# Patient Record
Sex: Male | Born: 1935
Health system: Southern US, Community
[De-identification: ages and names within clinical notes are randomized; demographics above are authoritative.]

## PROBLEM LIST (undated history)

## (undated) DIAGNOSIS — Z77090 Contact with and (suspected) exposure to asbestos: Secondary | ICD-10-CM

## (undated) DIAGNOSIS — E785 Hyperlipidemia, unspecified: Secondary | ICD-10-CM

## (undated) DIAGNOSIS — J449 Chronic obstructive pulmonary disease, unspecified: Secondary | ICD-10-CM

## (undated) DIAGNOSIS — J189 Pneumonia, unspecified organism: Secondary | ICD-10-CM

## (undated) DIAGNOSIS — I10 Essential (primary) hypertension: Secondary | ICD-10-CM

## (undated) HISTORY — DX: Chronic obstructive pulmonary disease, unspecified: J44.9

## (undated) HISTORY — DX: Hyperlipidemia, unspecified: E78.5

## (undated) HISTORY — PX: APPENDECTOMY: SHX54

## (undated) HISTORY — DX: Essential (primary) hypertension: I10

## (undated) HISTORY — PX: SHOULDER SURGERY: SHX246

---

## 2015-12-06 ENCOUNTER — Emergency Department (HOSPITAL_BASED_OUTPATIENT_CLINIC_OR_DEPARTMENT_OTHER)
Admission: EM | Admit: 2015-12-06 | Discharge: 2015-12-06 | Disposition: A | Discharge status: Home / Self Care | Payer: Medicare Other | Attending: Emergency Medicine | Admitting: Emergency Medicine

## 2015-12-06 ENCOUNTER — Emergency Department (HOSPITAL_BASED_OUTPATIENT_CLINIC_OR_DEPARTMENT_OTHER): Payer: Medicare Other

## 2015-12-06 ENCOUNTER — Encounter (HOSPITAL_BASED_OUTPATIENT_CLINIC_OR_DEPARTMENT_OTHER): Payer: Self-pay | Admitting: *Deleted

## 2015-12-06 DIAGNOSIS — J40 Bronchitis, not specified as acute or chronic: Secondary | ICD-10-CM

## 2015-12-06 DIAGNOSIS — Z87891 Personal history of nicotine dependence: Secondary | ICD-10-CM | POA: Insufficient documentation

## 2015-12-06 DIAGNOSIS — J8410 Pulmonary fibrosis, unspecified: Secondary | ICD-10-CM

## 2015-12-06 DIAGNOSIS — J441 Chronic obstructive pulmonary disease with (acute) exacerbation: Secondary | ICD-10-CM | POA: Diagnosis not present

## 2015-12-06 DIAGNOSIS — Z79899 Other long term (current) drug therapy: Secondary | ICD-10-CM | POA: Insufficient documentation

## 2015-12-06 DIAGNOSIS — K920 Hematemesis: Secondary | ICD-10-CM | POA: Diagnosis present

## 2015-12-06 DIAGNOSIS — Z8709 Personal history of other diseases of the respiratory system: Secondary | ICD-10-CM

## 2015-12-06 DIAGNOSIS — L03115 Cellulitis of right lower limb: Secondary | ICD-10-CM | POA: Insufficient documentation

## 2015-12-06 HISTORY — DX: Contact with and (suspected) exposure to asbestos: Z77.090

## 2015-12-06 HISTORY — DX: Pneumonia, unspecified organism: J18.9

## 2015-12-06 LAB — CBC WITH DIFFERENTIAL/PLATELET
BASOS ABS: 0 10*3/uL (ref 0.0–0.1)
BASOS PCT: 0 %
Eosinophils Absolute: 0.1 10*3/uL (ref 0.0–0.7)
Eosinophils Relative: 1 %
HEMATOCRIT: 41.1 % (ref 39.0–52.0)
Hemoglobin: 14 g/dL (ref 13.0–17.0)
LYMPHS ABS: 1.1 10*3/uL (ref 0.7–4.0)
LYMPHS PCT: 8 %
MCH: 31.9 pg (ref 26.0–34.0)
MCHC: 34.1 g/dL (ref 30.0–36.0)
MCV: 93.6 fL (ref 78.0–100.0)
MONO ABS: 0.8 10*3/uL (ref 0.1–1.0)
Monocytes Relative: 6 %
NEUTROS ABS: 11.1 10*3/uL — AB (ref 1.7–7.7)
Neutrophils Relative %: 85 %
PLATELETS: 229 10*3/uL (ref 150–400)
RBC: 4.39 MIL/uL (ref 4.22–5.81)
RDW: 12.6 % (ref 11.5–15.5)
WBC: 13.2 10*3/uL — ABNORMAL HIGH (ref 4.0–10.5)

## 2015-12-06 LAB — TROPONIN I

## 2015-12-06 LAB — COMPREHENSIVE METABOLIC PANEL
ALT: 15 U/L — AB (ref 17–63)
ANION GAP: 7 (ref 5–15)
AST: 23 U/L (ref 15–41)
Albumin: 3.3 g/dL — ABNORMAL LOW (ref 3.5–5.0)
Alkaline Phosphatase: 72 U/L (ref 38–126)
BILIRUBIN TOTAL: 0.8 mg/dL (ref 0.3–1.2)
BUN: 20 mg/dL (ref 6–20)
CHLORIDE: 96 mmol/L — AB (ref 101–111)
CO2: 30 mmol/L (ref 22–32)
Calcium: 8.6 mg/dL — ABNORMAL LOW (ref 8.9–10.3)
Creatinine, Ser: 1.17 mg/dL (ref 0.61–1.24)
GFR calc Af Amer: >60 mL/min (ref >60)
GFR calc non Af Amer: 57 mL/min — ABNORMAL LOW (ref >60)
GLUCOSE: 106 mg/dL — AB (ref 65–99)
POTASSIUM: 4.4 mmol/L (ref 3.5–5.1)
Sodium: 133 mmol/L — ABNORMAL LOW (ref 135–145)
TOTAL PROTEIN: 7.4 g/dL (ref 6.5–8.1)

## 2015-12-06 LAB — I-STAT CG4 LACTIC ACID, ED: LACTIC ACID, VENOUS: 0.9 mmol/L (ref 0.5–1.9)

## 2015-12-06 MED ORDER — ALBUTEROL SULFATE (2.5 MG/3ML) 0.083% IN NEBU
5.0000 mg | INHALATION_SOLUTION | Freq: Once | RESPIRATORY_TRACT | Status: AC
  Administered 2015-12-06: 5 mg via RESPIRATORY_TRACT
  Filled 2015-12-06: qty 6

## 2015-12-06 MED ORDER — HYDROCODONE-ACETAMINOPHEN 5-325 MG PO TABS: 1.0000 | ORAL_TABLET | ORAL | 0 refills | Status: DC | PRN

## 2015-12-06 MED ORDER — KETOROLAC TROMETHAMINE 30 MG/ML IJ SOLN
30.0000 mg | Freq: Once | INTRAMUSCULAR | Status: AC
  Administered 2015-12-06: 30 mg via INTRAVENOUS
  Filled 2015-12-06: qty 1

## 2015-12-06 MED ORDER — IOPAMIDOL (ISOVUE-370) INJECTION 76%
100.0000 mL | Freq: Once | INTRAVENOUS | Status: AC | PRN
  Administered 2015-12-06: 100 mL via INTRAVENOUS

## 2015-12-06 MED ORDER — AZITHROMYCIN 250 MG PO TABS: 250.0000 mg | ORAL_TABLET | Freq: Every day | ORAL | 0 refills | Status: DC

## 2015-12-06 MED ORDER — AZITHROMYCIN 250 MG PO TABS
500.0000 mg | ORAL_TABLET | Freq: Once | ORAL | Status: AC
  Administered 2015-12-06: 500 mg via ORAL
  Filled 2015-12-06: qty 2

## 2015-12-06 MED ORDER — ALBUTEROL SULFATE HFA 108 (90 BASE) MCG/ACT IN AERS
1.0000 | INHALATION_SPRAY | RESPIRATORY_TRACT | Status: DC | PRN
  Administered 2015-12-06: 2 via RESPIRATORY_TRACT
  Filled 2015-12-06: qty 6.7

## 2015-12-06 MED ORDER — SODIUM CHLORIDE 0.9 % IV BOLUS (SEPSIS)
1000.0000 mL | Freq: Once | INTRAVENOUS | Status: AC
  Administered 2015-12-06: 1000 mL via INTRAVENOUS

## 2015-12-06 MED ORDER — METHYLPREDNISOLONE SODIUM SUCC 125 MG IJ SOLR
INTRAMUSCULAR | Status: AC
  Filled 2015-12-06: qty 2

## 2015-12-06 MED ORDER — PROBIOTIC & ACIDOPHILUS EX ST PO CAPS: 1.0000 | ORAL_CAPSULE | Freq: Two times a day (BID) | ORAL | 0 refills | Status: DC

## 2015-12-06 MED ORDER — METHYLPREDNISOLONE SODIUM SUCC 125 MG IJ SOLR
125.0000 mg | Freq: Once | INTRAMUSCULAR | Status: AC
  Administered 2015-12-06: 125 mg via INTRAVENOUS
  Filled 2015-12-06: qty 2

## 2015-12-06 MED ORDER — AEROCHAMBER PLUS FLO-VU MEDIUM MISC
1.0000 | Freq: Once | Status: AC
  Administered 2015-12-06: 1
  Filled 2015-12-06: qty 1

## 2015-12-06 MED ORDER — PREDNISONE 10 MG (21) PO TBPK: 10.0000 mg | ORAL_TABLET | Freq: Every day | ORAL | 0 refills | Status: DC

## 2015-12-06 MED ORDER — SULFAMETHOXAZOLE-TRIMETHOPRIM 800-160 MG PO TABS: 1.0000 | ORAL_TABLET | Freq: Two times a day (BID) | ORAL | 0 refills | Status: AC

## 2015-12-06 NOTE — ED Notes (Signed)
Pt on cardiac monitor and automatic VS 

## 2015-12-06 NOTE — ED Provider Notes (Signed)
[facility] Provider Note   CSN: [id number] Arrival date & time: 12/06/15  1113  First Provider Contact:  None       History   Chief Complaint Chief Complaint  Patient presents with  . Cough  . Ankle Pain    HPI Jonathan Harmon is a 82 y.o. male.  Pt said that he has had a bloody cough for the past few days.  He started feeling ill on Wednesday the 9th.  On Thursday the 10th, he noted that his right ankle was swollen.  The pt said that happens when he gets pneumonia.  The pt said that he went to the hospital in [place] on the 11th. He was told that he has pneumonia.  The pt was put on levaquin.  Pt said that he is not getting any better and is getting worse.    This is the ED visit from 8/11 from [facility]:  EMERGENCY DEPARTMENT PROVIDER NOTE  Chief Complaint  Patient presents with  . Shortness of Breath  . 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 No hemoptysis hematemesis no vomiting just malaise fatigue lack of energy. No other faint feeling dizziness lightheadedness. No other decreased urinary output rash. Gradual onset worsening over the past day contacted his Dr. is out of town so came here instead to try to headed off before it gets too bad  History: History reviewed. No pertinent past medical history. Past Surgical History:  Procedure Laterality Date  . ORTHOPEDIC SURGERY   History reviewed. No pertinent family history.  Social History  Substance Use Topics  . Smoking status: Never Smoker  . Smokeless tobacco: Never Used  . Alcohol use No   Review of Systems  Constitutional: Positive for activity change, fatigue and fever.  HENT: Negative.  Respiratory: Positive for cough and shortness of breath. Negative for wheezing and stridor.  Cardiovascular: Negative.    Gastrointestinal: Positive for nausea. Negative for abdominal pain, constipation and vomiting.  Endocrine: Negative.  Genitourinary: Negative.  Musculoskeletal: Positive for arthralgias. Negative for joint swelling.  Skin: Negative.  Neurological: Negative.  Hematological: Negative.   ED Triage Vitals  Temp Heart Rate Resp BP SpO2  12/04/15 1644 12/04/15 1644 12/04/15 1644 12/04/15 1644 12/04/15 1644  37.3 C (99.2 F) 93 18 123/73 93 %   Temp Source Heart Rate Source Patient Position BP Location FiO2 (%)  12/04/15 1644 -- -- -- --  Oral   Physical Exam  Constitutional: He is oriented to person, place, and time. He appears well-developed and well-nourished.  HENT:  Head: Atraumatic.  Right Ear: External ear normal.  Left Ear: External ear normal.  Nose: Nose normal.  Mouth/Throat: Oropharynx is clear and moist.  Eyes: Conjunctivae and EOM are normal. Pupils are equal, round, and reactive to light.  Neck: Normal range of motion. Neck supple.  Cardiovascular: Normal rate, regular rhythm, normal heart sounds and intact distal pulses.  Pulmonary/Chest: Effort normal. He has wheezes. He has rales.  Rales and rhonchi in the right lower lobe but no other respiratory distress noted  Abdominal: Soft. Bowel sounds are normal.  Musculoskeletal: Normal range of motion.  Neurological: He is alert and oriented to person, place, and time.  Skin: Skin is warm and dry.  Psychiatric: He has a normal mood and affect. His behavior is normal. Thought content normal.  Nursing note and vitals reviewed.  ED Course & MDM  ED Course   MDM  Clinical Impression(s): 1. Bronchitis   Disposition: Discharge  Dimple Casey, MD 12/04/2015 5:59 PM   Rolanda Jay, MD 12/04/15 1759      Past Medical History:  Diagnosis Date  . Asbestos exposure    diminished lung compacity from asbestos exposure 30 years  . Pneumonia    x 4    There are no active problems to display for this  patient.   Past Surgical History:  Procedure Laterality Date  . APPENDECTOMY    . SHOULDER SURGERY Bilateral    x 5 ( 2 on right ; 3 on left)       Home Medications    Prior to Admission medications   Medication Sig Start Date End Date Taking? Authorizing Provider  tiotropium (SPIRIVA) 18 MCG inhalation capsule Place 18 mcg into inhaler and inhale daily.   Yes Historical Provider, MD  azithromycin (ZITHROMAX Z-PAK) 250 MG tablet Take 1 tablet (250 mg total) by mouth daily. 12/06/15   Jacalyn Lefevre, MD  predniSONE (STERAPRED UNI-PAK 21 TAB) 10 MG (21) TBPK tablet Take 1 tablet (10 mg total) by mouth daily. Take 6 tabs by mouth daily  for 2 days, then 5 tabs for 2 days, then 4 tabs for 2 days, then 3 tabs for 2 days, 2 tabs for 2 days, then 1 tab by mouth daily for 2 days 12/06/15   Jacalyn Lefevre, MD  Probiotic Product (PROBIOTIC & ACIDOPHILUS EX ST) CAPS Take 1 capsule by mouth 2 (two) times daily. 12/06/15   Jacalyn Lefevre, MD  sulfamethoxazole-trimethoprim (BACTRIM DS,SEPTRA DS) 800-160 MG tablet Take 1 tablet by mouth 2 (two) times daily. 12/06/15 12/13/15  Jacalyn Lefevre, MD    Family History No family history on file.  Social History Social History  Substance Use Topics  . Smoking status: Former Games developer  . Smokeless tobacco: Never Used  . Alcohol use No     Comment: rarely     Allergies   Penicillins   Review of Systems Review of Systems  Constitutional: Positive for activity change, appetite change and fatigue.  Respiratory: Positive for cough, shortness of breath and wheezing.   Musculoskeletal:       Right ankle pain  All other systems reviewed and are negative.    Physical Exam Updated Vital Signs BP 123/59   Pulse 80   Temp 98 F (36.7 C) (Oral)   Resp 16   Ht 5\' 8"  (1.727 m)   Wt 130 lb (59 kg)   SpO2 92%   BMI 19.77 kg/m   Physical Exam  Constitutional: He is oriented to person, place, and time. He appears well-developed and well-nourished.    HENT:  Head: Normocephalic and atraumatic.  Right Ear: External ear normal.  Left Ear: External ear normal.  Nose: Nose normal.  Mouth/Throat: Oropharynx is clear and moist.  Eyes: Conjunctivae and EOM are normal. Pupils are equal, round, and reactive to light.  Neck: Normal range of motion. Neck supple.  Cardiovascular: Normal rate, regular rhythm, normal heart sounds and intact distal pulses.   Pulmonary/Chest: Effort normal. He has rhonchi.  Abdominal: Soft. Bowel sounds are normal.  Musculoskeletal:       Right ankle: He exhibits swelling.  Redness to right ankle  Neurological: He is alert and oriented to person, place, and time.  Skin: Skin is warm.  Psychiatric: He has a normal mood and affect. His behavior is  normal. Judgment and thought content normal.  Nursing note and vitals reviewed.    ED Treatments / Results  Labs (all labs ordered are listed, but only abnormal results are displayed) Labs Reviewed  COMPREHENSIVE METABOLIC PANEL - Abnormal; Notable for the following:       Result Value   Sodium 133 (*)    Chloride 96 (*)    Glucose, Bld 106 (*)    Calcium 8.6 (*)    Albumin 3.3 (*)    ALT 15 (*)    GFR calc non Af Amer 57 (*)    All other components within normal limits  CBC WITH DIFFERENTIAL/PLATELET - Abnormal; Notable for the following:    WBC 13.2 (*)    Neutro Abs 11.1 (*)    All other components within normal limits  CULTURE, BLOOD (ROUTINE X 2)  CULTURE, BLOOD (ROUTINE X 2)  TROPONIN I  I-STAT CG4 LACTIC ACID, ED  I-STAT CG4 LACTIC ACID, ED    EKG  EKG Interpretation  Date/Time:  Sunday December 06 2015 11:59:46 EDT Ventricular Rate:  83 PR Interval:    QRS Duration: 91 QT Interval:  345 QTC Calculation: 406 R Axis:   85 Text Interpretation:  Sinus rhythm Atrial premature complex Probable left atrial enlargement Borderline right axis deviation Confirmed by Tevin Shillingford MD, Yamili Lichtenwalner ([id number]) on 12/06/2015 12:15:17 PM       Radiology Dg Ankle  Complete Right  Result Date: 12/06/2015 CLINICAL DATA:  RIGHT lateral ankle pain and swelling with redness, unable to bear weight, patient states he has pneumonia and that his ankle gets red and swollen whenever he has pneumonia EXAM: RIGHT ANKLE - COMPLETE 3+ VIEW COMPARISON:  None FINDINGS: Mild soft tissue swelling. Osseous demineralization. Non fused ossicle at tip of lateral malleolus. Spurring at tip of medial malleolus. Joint spaces preserved. No acute fracture, dislocation or bone destruction. Plantar calcaneal spur formation. Atherosclerotic calcification at dorsalis pedis artery. IMPRESSION: No definite acute osseous abnormalities. Electronically Signed   By: Ulyses SouthwardMark  Boles M.D.   On: 12/06/2015 13:26   Ct Angio Chest W/cm &/or Wo Cm  Result Date: 12/06/2015 CLINICAL DATA:  Productive cough with hemoptysis. Short of breath. Symptoms for 1 week. History of asbestos exposure. EXAM: CT ANGIOGRAPHY CHEST WITH CONTRAST TECHNIQUE: Multidetector CT imaging of the chest was performed using the standard protocol during bolus administration of intravenous contrast. Multiplanar CT image reconstructions and MIPs were obtained to evaluate the vascular anatomy. CONTRAST:  100 cc Isovue 370 COMPARISON:  None. FINDINGS: There are no filling defects in the pulmonary arterial tree to suggest acute pulmonary thromboembolism. Atherosclerotic calcifications of the aortic arch are noted. No evidence of dissection or aneurysm. Great vessels are grossly patent. Three vessel coronary artery calcifications No evidence of mediastinal adenopathy. Right hilar adenopathy is present. 12 mm short axis diameter right hilar lymph node on image 66. Extensive calcified pleural plaques are seen throughout both hemithoraces. Anterior plaques and the left posterior plaque are associated with some smooth soft tissue but there is no lobulation to suggest malignant mesothelioma. No pleural effusion. No pneumothorax. Severe emphysema is present.  There is an indeterminate patchy density at the right apex measuring 9 mm extending towards the apical pleural surface. There are irregular and linear peripheral pulmonary opacities primarily affecting the right lung, and to a lesser degree at the left base. Early honeycombing is appreciated at the right lung base posteriorly. There is scarring in the lingula extending from a pleural plaque. No acute bony deformity. Upper  abdomen is benign. Review of the MIP images confirms the above findings. IMPRESSION: No evidence of acute pulmonary thromboembolism Extensive bilateral calcified pleural plaques consistent with a history of asbestos exposure. There is interstitial lung disease with early honeycombing at the lung bases. Given history of asbestos exposure, asbestosis with pulmonary fibrosis is suspected. Indeterminate 9 mm pulmonary parenchymal opacity at the right apex. There is also right hilar adenopathy. Malignancy is not excluded. Three-month follow-up is recommended at the minimum. Alternatively, PET-CT may be helpful. Electronically Signed   By: Jolaine Click M.D.   On: 12/06/2015 13:25    Procedures Procedures (including critical care time)  Medications Ordered in ED Medications  methylPREDNISolone sodium succinate (SOLU-MEDROL) 125 mg/2 mL injection (not administered)  ketorolac (TORADOL) 30 MG/ML injection 30 mg (not administered)  azithromycin (ZITHROMAX) tablet 500 mg (not administered)  albuterol (PROVENTIL HFA;VENTOLIN HFA) 108 (90 Base) MCG/ACT inhaler 1-2 puff (2 puffs Inhalation Given 12/06/15 1433)  albuterol (PROVENTIL) (2.5 MG/3ML) 0.083% nebulizer solution 5 mg (5 mg Nebulization Given 12/06/15 1303)  methylPREDNISolone sodium succinate (SOLU-MEDROL) 125 mg/2 mL injection 125 mg (125 mg Intravenous Given 12/06/15 1322)  sodium chloride 0.9 % bolus 1,000 mL (1,000 mLs Intravenous New Bag/Given 12/06/15 1315)  iopamidol (ISOVUE-370) 76 % injection 100 mL (100 mLs Intravenous Contrast  Given 12/06/15 1245)  albuterol (PROVENTIL) (2.5 MG/3ML) 0.083% nebulizer solution 5 mg (5 mg Nebulization Given 12/06/15 1421)  AEROCHAMBER PLUS FLO-VU MEDIUM MISC 1 each (1 each Other Given 12/06/15 1433)     Initial Impression / Assessment and Plan / ED Course  I have reviewed the triage vital signs and the nursing notes.  Pertinent labs & imaging results that were available during my care of the patient were reviewed by me and considered in my medical decision making (see chart for details).  Clinical Course   Right ankle does not appear to be a septic joint as he has good rom.  However, he is told to f/u with ortho back home in [place] if redness worsens.  He still does not feel great, but did eat some ice cream while here.  He does not have albuterol at home, just spiriva, so he is given an albuterol inhaler with a spacer.  Pt also encouraged to f/u with pulmonology, although he said he does not get along with the pulmonologists there.  I told him that he is welcome to use the drs in [place].   He knows to return if worse.  Final Clinical Impressions(s) / ED Diagnoses   Final diagnoses:  Bronchitis  Cellulitis of right ankle  COPD with acute exacerbation (HCC)  History of asbestosis  Pulmonary fibrosis (HCC)    New Prescriptions New Prescriptions   AZITHROMYCIN (ZITHROMAX Z-PAK) 250 MG TABLET    Take 1 tablet (250 mg total) by mouth daily.   PREDNISONE (STERAPRED UNI-PAK 21 TAB) 10 MG (21) TBPK TABLET    Take 1 tablet (10 mg total) by mouth daily. Take 6 tabs by mouth daily  for 2 days, then 5 tabs for 2 days, then 4 tabs for 2 days, then 3 tabs for 2 days, 2 tabs for 2 days, then 1 tab by mouth daily for 2 days   PROBIOTIC PRODUCT (PROBIOTIC & ACIDOPHILUS EX ST) CAPS    Take 1 capsule by mouth 2 (two) times daily.   SULFAMETHOXAZOLE-TRIMETHOPRIM (BACTRIM DS,SEPTRA DS) 800-160 MG TABLET    Take 1 tablet by mouth 2 (two) times daily.     Jacalyn Lefevre, MD 12/06/15  1504  

## 2015-12-06 NOTE — Discharge Instructions (Signed)
F/u with your pcp when you get home.

## 2015-12-06 NOTE — ED Triage Notes (Signed)
Patient states he developed swelling in his right ankle four days ago.  Three days ago, he was diagnosed with pneumonia.  States he developed a productive cough with bright red secretions.

## 2015-12-11 LAB — CULTURE, BLOOD (ROUTINE X 2)
CULTURE: NO GROWTH
Culture: NO GROWTH

## 2016-07-28 ENCOUNTER — Ambulatory Visit (INDEPENDENT_AMBULATORY_CARE_PROVIDER_SITE_OTHER): Payer: Medicare Other | Admitting: Pulmonary Disease

## 2016-07-28 ENCOUNTER — Encounter: Payer: Self-pay | Admitting: Pulmonary Disease

## 2016-07-28 ENCOUNTER — Other Ambulatory Visit (INDEPENDENT_AMBULATORY_CARE_PROVIDER_SITE_OTHER): Payer: Medicare Other

## 2016-07-28 VITALS — BP 144/72 | HR 59 | Ht 68.0 in | Wt 133.6 lb

## 2016-07-28 DIAGNOSIS — J449 Chronic obstructive pulmonary disease, unspecified: Secondary | ICD-10-CM | POA: Insufficient documentation

## 2016-07-28 DIAGNOSIS — Z77090 Contact with and (suspected) exposure to asbestos: Secondary | ICD-10-CM | POA: Diagnosis not present

## 2016-07-28 DIAGNOSIS — J439 Emphysema, unspecified: Secondary | ICD-10-CM

## 2016-07-28 DIAGNOSIS — R0602 Shortness of breath: Secondary | ICD-10-CM | POA: Diagnosis not present

## 2016-07-28 DIAGNOSIS — R0609 Other forms of dyspnea: Secondary | ICD-10-CM

## 2016-07-28 DIAGNOSIS — J920 Pleural plaque with presence of asbestos: Secondary | ICD-10-CM | POA: Diagnosis not present

## 2016-07-28 LAB — BASIC METABOLIC PANEL
BUN: 22 mg/dL (ref 6–23)
CO2: 31 mEq/L (ref 19–32)
Calcium: 9.7 mg/dL (ref 8.4–10.5)
Chloride: 106 mEq/L (ref 96–112)
Creatinine, Ser: 1.11 mg/dL (ref 0.40–1.50)
GFR: 67.65 mL/min (ref >60.00)
GLUCOSE: 98 mg/dL (ref 70–99)
POTASSIUM: 5.1 meq/L (ref 3.5–5.1)
SODIUM: 140 meq/L (ref 135–145)

## 2016-07-28 MED ORDER — FLUTICASONE-UMECLIDIN-VILANT 100-62.5-25 MCG/INH IN AEPB
1.0000 | INHALATION_SPRAY | Freq: Every day | RESPIRATORY_TRACT | 0 refills | Status: DC
Start: 2016-07-28 — End: 2018-08-02

## 2016-07-28 MED ORDER — ALBUTEROL SULFATE HFA 108 (90 BASE) MCG/ACT IN AERS: 2.0000 | INHALATION_SPRAY | RESPIRATORY_TRACT | 6 refills | Status: DC | PRN

## 2016-07-28 NOTE — Assessment & Plan Note (Signed)
Multifactorial, related to the following: 1. Likely moderate-severe COPD. No records of PFT. Chest CT scan with moderate-severe COPD changes. 2. Based on the chest CT scan in August 2017, he had calcified pleural plaques bilaterally as well as some fibrosis at the bases. Most likely related to asbestos lung disease. He also had a right hilar lymphadenopathy for which I'm not sure if it's related to asbestosis or malignancy. Pt with 40 pound weight loss. 3. CAD. On medical therapy.   Plan : 1. Rx COPD. Will discuss on a separate problem.  2. Rx CAD. Pt on cardiac meds. Medical management only.  3. Needs a chest ct scan with contrast to f/u on asbestos lung disease, fibrosis, R hilar LAD. R/O malignancy (lung vs mesothelioma).

## 2016-07-28 NOTE — Patient Instructions (Signed)
It was a pleasure taking care of you today!  You are diagnosed with Chronic Obstructive Pulmonary Disease or COPD.  COPD is a preventable and treatable disease that makes it difficult to empty air out of the lungs (airflow obstruction).  This can lead to shortness of breath.   Sometimes, when you have a lung infection, this can make your breathing worse, and will cause you to have a COPD flare-up or an acute exacerbation of COPD. Please call your primary care doctor or the office if you are having a COPD flare-up.   Smoking makes COPD worse.   Make sure you use your medications for COPD -- Maintenance medications : We will try you on Trelegy, 1 puff daily. Try this for 2 weeks and see if it makes a difference.  If not, stop it and resume Spiriva daily.   Rescue medications: Albuterol 2 puffs every 4 hours as needed for shortness of breath.  Please rinse your mouth each time you use your maintenance medication.  Please call the office if you are having issues with your medications  We will schedule you for breathing test, chest ct scan, blood test.   Return to clinic in 4 - 5 weeks with Dr. Christene Slates or NP.

## 2016-07-28 NOTE — Assessment & Plan Note (Signed)
Pt with significant smoking history, quit 30 yrs ago.  Had asbestos exposure. Mod-severe copd based on chest ct scan. Better on spiriva.  Stiolto not much different. Very good fxnal capacity. Plays golf 2-3 times a week. Uses a treadmill several times a week.  Plan : We extensively discussed the diagnosis, pathophysiology, and medications  for COPD.   Patient will need : PFT Chest Ct scan (w/u for asbestos also as well as ILD) Alpha one antitrypsin level  >> done today >> no desatn. Will d/c o2 ordered in 04/2016.  Nocturnal oximetry >> on f/u.  He wanted to hold off for now.   We will start patient on  Trelegy. If not better in 2 weeks, resume Spiriva capsule.  He was not better on stiolto.  Patient was instructed to rinse mouth each time he/she uses trelegy.   Influenza vaccine -- UTD.  Pneumococcal vaccine -- told daughter in law to check.   Patient was instructed to call the office if he/she has issues with the medications.   Patient was instructed to call the office if he/she is having issues with shortness of breath (i.e. COPD exacerbation).   Patient was instructed to call the office if with increased shortness of breath, cough, sputum production, fevers. Patient may need to be seen at the office for work-up.   Counseled patient on the importance of NOT smoking.

## 2016-07-28 NOTE — Assessment & Plan Note (Signed)
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 He was exposed to asbestos. His wife had asbestos lung disease and had complications from it. He retired in 1992.  Chest CT scan in August 2017 showed calcified pleural plaques as well as some honeycombing at the bases as well as an enlarged right hilar lymphadenopathy. Patient with unintentional weight loss, 40 pounds over several years.  Plan for chest CT scan with contrast.

## 2016-07-28 NOTE — Progress Notes (Signed)
Subjective:    Patient ID: Jonathan Harmon, male    DOB: 1933/06/02, 83 y.o.   MRN: [id number]  HPI   This is the case of Jonathan Harmon, 83 y.o. Male, who made this referral because he wanted a second opinion regarding his dyspnea.  Patient has a 40-pack-year smoking history, he quit when he was in his mid 64s. He has been diagnosed with COPD, which has been stable. He has been on Spiriva capsule daily for a couple of years.   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 He has been in [place] since 2002. He lives in [place]. Patient has had exertional dyspnea the last 20 years at least, denies being significantly worse recently. The dyspnea however has not significantly impacted his functionality. He plays golf at least 2-3 times a week. He uses a cart to move around. He is very functional. He also uses a treadmill several times during the week and he does not have issues with that. Recently, he started inclined treadmill and he's noticed more dyspnea without but not that significant. Because of his personality with him wanting to be the best potential, he wanted to improve his breathing so he ended up seeing a pulmonologist in [place].  He did not go along well with a pulmonologist in [place]. The pulmonologist referred him to a cardiologist. He had cardiac workup which showed stress test allegedly being positive. By history, he had a left heart catheterization for which stents were recommended. They decided to hold off on the stent because of the anatomy as well as his age and comorbidities. That cardiac workup started off when he was having some chest discomfort and that has since gone away since being on cardiac meds.  Patient denies any history of asthma. As mentioned he was diagnosed with COPD several years ago for which she is on Spiriva.  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  He was exposed to asbestos because of his work. Unfortunately, his wife was also exposed to [name] best as because of him. She ended up having severe complications related to that, ended up having oxygen, ended up dying secondary to complications of asbestos lung disease. She died 15 months ago. 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  Patient has lost 30 pounds over 4 years, unintentional. He has chronic dry cough for the most part. Sometimes with clear mucus.  He saw the pulmonologist in [place] in January, he had a walk test which showed that his oxygen level dropped. He was given oxygen but he felt he did not need it. He still has the oxygen at his house but he has not used it. He was also given stiolto when he saw pulm MD and used it for a month.  Not any different compared to spiriva.    He had a bout of bronchitis. In August 2007 for which she ended up going to the emergency room. He improved with antibiotics. Chest CT scan was done and will discuss in the problem list.  Review of Systems  Constitutional: Positive for unexpected weight change ( 30lb x 3-4 years, not trying). Negative for fever.  HENT: Negative for congestion, dental problem, ear pain, nosebleeds, postnasal drip, rhinorrhea, sinus pressure, sneezing, sore throat and trouble swallowing.        Allergies  Eyes: Negative for redness and itching.  Respiratory: Positive for  shortness of breath. Negative for cough, chest tightness and wheezing.   Cardiovascular: Negative for palpitations and leg swelling.  Gastrointestinal: Negative for nausea and vomiting.  Genitourinary: Negative for dysuria.  Musculoskeletal: Negative for joint swelling.  Skin: Negative for rash.  Neurological: Negative for headaches.  Hematological: Does not bruise/bleed easily.  Psychiatric/Behavioral: Negative for dysphoric mood. The patient is not nervous/anxious.    Past Medical History:  Diagnosis Date  . Asbestos exposure     diminished lung compacity from asbestos exposure 30 years  . Pneumonia    x 4   (-) CA, DVT  Family History  Problem Relation Age of Onset  . Heart attack Father      Past Surgical History:  Procedure Laterality Date  . APPENDECTOMY    . SHOULDER SURGERY Bilateral    x 5 ( 2 on right ; 3 on left)    Social History   Social History  . Marital status: Widowed    Spouse name: N/A  . Number of children: N/A  . Years of education: N/A   Occupational History  . Retired IT sales professional    Social History Main Topics  . Smoking status: Former Smoker    Packs/day: 1.00    Years: 40.00    Types: Cigarettes    Quit date: 04/25/2000  . Smokeless tobacco: Never Used  . Alcohol use No     Comment: rarely  . Drug use: No  . Sexual activity: Not on file   Other Topics Concern  . Not on file   Social History Narrative  . No narrative on file   Widow, lives in [place].  Has 2 children.   Allergies  Allergen Reactions  . Penicillins Anaphylaxis     Outpatient Medications Prior to Visit  Medication Sig Dispense Refill  . tiotropium (SPIRIVA) 18 MCG inhalation capsule Place 18 mcg into inhaler and inhale daily.    Marland Kitchen azithromycin (ZITHROMAX Z-PAK) 250 MG tablet Take 1 tablet (250 mg total) by mouth daily. (Patient not taking: Reported on 07/28/2016) 4 tablet 0  . HYDROcodone-acetaminophen (NORCO/VICODIN) 5-325 MG tablet Take 1 tablet by mouth every 4 (four) hours as needed. (Patient not taking: Reported on 07/28/2016) 10 tablet 0  . predniSONE (STERAPRED UNI-PAK 21 TAB) 10 MG (21) TBPK tablet Take 1 tablet (10 mg total) by mouth daily. Take 6 tabs by mouth daily  for 2 days, then 5 tabs for 2 days, then 4 tabs for 2 days, then 3 tabs for 2 days, 2 tabs for 2 days, then 1 tab by mouth daily for 2 days (Patient not taking: Reported on 07/28/2016) 42 tablet 0  . Probiotic Product (PROBIOTIC & ACIDOPHILUS EX ST) CAPS Take 1 capsule by mouth 2 (two) times daily. (Patient not taking: Reported  on 07/28/2016) 60 capsule 0   No facility-administered medications prior to visit.    Meds ordered this encounter  Medications  . atorvastatin (LIPITOR) 40 MG tablet    Sig: Take 40 mg by mouth daily.  . metoprolol succinate (TOPROL-XL) 25 MG 24 hr tablet    Sig: Take 25 mg by mouth daily.  . cilostazol (PLETAL) 100 MG tablet    Sig: Take 100 mg by mouth 2 (two) times daily.  . Fluticasone-Umeclidin-Vilant (TRELEGY ELLIPTA) 100-62.5-25 MCG/INH AEPB    Sig: Inhale 1 puff into the lungs daily.    Dispense:  7 each    Refill:  0  . albuterol (PROVENTIL HFA;VENTOLIN HFA) 108 (90 Base) MCG/ACT inhaler  Sig: Inhale 2 puffs into the lungs every 4 (four) hours as needed for wheezing or shortness of breath.    Dispense:  1 Inhaler    Refill:  6        Objective:   Physical Exam     Vitals:  Vitals:   07/28/16 1150  BP: (!) 144/72  Pulse: (!) 59  SpO2: 98%  Weight: 133 lb 9.6 oz (60.6 kg)  Height:  (1.727 m)    Constitutional/General:  Pleasant, well-nourished, well-developed, not in any distress,  Comfortably seating.  Well kempt  Body mass index is 20.31 kg/m. Wt Readings from Last 3 Encounters:  07/28/16 133 lb 9.6 oz (60.6 kg)  12/06/15 130 lb (59 kg)       HEENT: Pupils equal and reactive to light and accommodation. Anicteric sclerae. Normal nasal mucosa.   No oral  lesions,  mouth clear,  oropharynx clear, no postnasal drip. (-) Oral thrush. No dental caries.  Airway - Mallampati class II  Neck: No masses. Midline trachea. No JVD, (-) LAD. (-) bruits appreciated.  Respiratory/Chest: Grossly normal chest. (-) deformity. (-) Accessory muscle use.  Symmetric expansion. (-) Tenderness on palpation.  Resonant on percussion.  Diminished BS on both lower lung zones. (-) wheezing, rhonchi Some crackles in the bases.  (-) egophony  Cardiovascular: Regular rate and  rhythm, heart sounds normal, no murmur or gallops, no peripheral edema  Gastrointestinal:   Normal bowel sounds. Soft, non-tender. No hepatosplenomegaly.  (-) masses.   Musculoskeletal:  Normal muscle tone. Normal gait.   Extremities: Grossly normal. (-) clubbing, cyanosis.  (-) edema  Skin: (-) rash,lesions seen.   Neurological/Psychiatric : alert, oriented to time, place, person. Normal mood and affect         Assessment & Plan:  Exertional dyspnea Multifactorial, related to the following: 1. Likely moderate-severe COPD. No records of PFT. Chest CT scan with moderate-severe COPD changes. 2. Based on the chest CT scan in August 2017, he had calcified pleural plaques bilaterally as well as some fibrosis at the bases. Most likely related to asbestos lung disease. He also had a right hilar lymphadenopathy for which I'm not sure if it's related to asbestosis or malignancy. Pt with 40 pound weight loss. 3. CAD. On medical therapy.   Plan : 1. Rx COPD. Will discuss on a separate problem.  2. Rx CAD. Pt on cardiac meds. Medical management only.  3. Needs a chest ct scan with contrast to f/u on asbestos lung disease, fibrosis, R hilar LAD. R/O malignancy (lung vs mesothelioma).   COPD (chronic obstructive pulmonary disease) (HCC) Pt with significant smoking history, quit 30 yrs ago.  Had asbestos exposure. Mod-severe copd based on chest ct scan. Better on spiriva.  Stiolto not much different. Very good fxnal capacity. Plays golf 2-3 times a week. Uses a treadmill several times a week.  Plan : We extensively discussed the diagnosis, pathophysiology, and medications  for COPD.   Patient will need : PFT Chest Ct scan (w/u for asbestos also as well as ILD) Alpha one antitrypsin level  >> done today >> no desatn. Will d/c o2 ordered in 04/2016.  Nocturnal oximetry >> on f/u.  He wanted to hold off for now.   We will start patient on  Trelegy. If not better in 2 weeks, resume Spiriva capsule.  He was not better on stiolto.  Patient was instructed to rinse mouth each time  he/she uses trelegy.   Influenza vaccine -- UTD.  Pneumococcal vaccine -- told daughter in law to check.   Patient was instructed to call the office if he/she has issues with the medications.   Patient was instructed to call the office if he/she is having issues with shortness of breath (i.e. COPD exacerbation).   Patient was instructed to call the office if with increased shortness of breath, cough, sputum production, fevers. Patient may need to be seen at the office for work-up.   Counseled patient on the importance of NOT smoking.    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 He was exposed to asbestos. His wife had asbestos lung disease and had complications from it. He retired in 1992.  Chest CT scan in August 2017 showed calcified pleural plaques as well as some honeycombing at the bases as well as an enlarged right hilar lymphadenopathy. Patient with unintentional weight loss, 40 pounds over several years.  Plan for chest CT scan with contrast.     Thank you very much for letting me participate in this patient's care. Please do not hesitate to give me a call if you have any questions or concerns regarding the treatment plan.   Patient will follow up with me in 4-6 weeks    J. Alexis Frock, MD 07/28/2016   5:29 PM Pulmonary and Critical Care Medicine [facility] Pager: [phone] Office: [phone], Fax: [phone]

## 2016-08-02 LAB — ALPHA-1 ANTITRYPSIN PHENOTYPE: A1 ANTITRYPSIN: 181 mg/dL (ref 83–199)

## 2016-08-09 LAB — PULMONARY FUNCTION TEST

## 2016-09-15 ENCOUNTER — Encounter: Payer: Self-pay | Admitting: Pulmonary Disease

## 2016-09-15 ENCOUNTER — Ambulatory Visit (INDEPENDENT_AMBULATORY_CARE_PROVIDER_SITE_OTHER): Payer: Medicare Other | Admitting: Pulmonary Disease

## 2016-09-15 DIAGNOSIS — J920 Pleural plaque with presence of asbestos: Secondary | ICD-10-CM | POA: Diagnosis not present

## 2016-09-15 DIAGNOSIS — R0609 Other forms of dyspnea: Secondary | ICD-10-CM | POA: Diagnosis not present

## 2016-09-15 DIAGNOSIS — J439 Emphysema, unspecified: Secondary | ICD-10-CM

## 2016-09-15 MED ORDER — TIOTROPIUM BROMIDE MONOHYDRATE 2.5 MCG/ACT IN AERS: 2.0000 | INHALATION_SPRAY | Freq: Every day | RESPIRATORY_TRACT | 11 refills | Status: DC

## 2016-09-15 MED ORDER — TIOTROPIUM BROMIDE MONOHYDRATE 2.5 MCG/ACT IN AERS: 2.0000 | INHALATION_SPRAY | Freq: Every day | RESPIRATORY_TRACT | 0 refills | Status: DC

## 2016-09-15 NOTE — Assessment & Plan Note (Addendum)
Pt with significant smoking history, quit 30 yrs ago.  Had asbestos exposure. Mod-severe copd based on chest ct scan. PFT in April 2018 with an FEV1 of 1.48 or 56% predicted, diffusion capacity 75%.  Better on spiriva.  Stiolto not much different.  trelegy did not make a lot of difference.  Very good fxnal capacity. Plays golf 2-3 times a week. Uses a treadmill several times a week.  Alpha one level was normal. Walk test in 07/2016 did not show desaturation.   Plan : We extensively discussed the diagnosis, pathophysiology, and medications  for COPD.   Patient will need : Nocturnal oximetry >> on f/u.  He wanted to hold off for now.   Cont spiriva >> was on the capsule and he wanted to switch to respimat.  Influenza vaccine -- UTD.  Pneumococcal vaccine -- received both after 83 yrs old  Patient was instructed to call the office if he/she has issues with the medications.   Patient was instructed to call the office if he/she is having issues with shortness of breath (i.e. COPD exacerbation).   Patient was instructed to call the office if with increased shortness of breath, cough, sputum production, fevers. Patient may need to be seen at the office for work-up.   Counseled patient on the importance of NOT smoking.

## 2016-09-15 NOTE — Progress Notes (Signed)
Subjective:    Patient ID: Jonathan Harmon, male    DOB: [birth year], 83 y.o.   MRN: [id number]  HPI   This is the case of Jonathan Harmon, 83 y.o. Male, who made this referral because he wanted a second opinion regarding his dyspnea.  Patient has a 40-pack-year smoking history, he quit when he was in his mid 24s. He has been diagnosed with COPD, which has been stable. He has been on Spiriva capsule daily for a couple of years.   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 He has been in [place] since 2002. He lives in [place]. Patient has had exertional dyspnea the last 20 years at least, denies being significantly worse recently. The dyspnea however has not significantly impacted his functionality. He plays golf at least 2-3 times a week. He uses a cart to move around. He is very functional. He also uses a treadmill several times during the week and he does not have issues with that. Recently, he started inclined treadmill and he's noticed more dyspnea without but not that significant. Because of his personality with him wanting to be the best potential, he wanted to improve his breathing so he ended up seeing a pulmonologist in [place].  He did not go along well with a pulmonologist in [place]. The pulmonologist referred him to a cardiologist. He had cardiac workup which showed stress test allegedly being positive. By history, he had a left heart catheterization for which stents were recommended. They decided to hold off on the stent because of the anatomy as well as his age and comorbidities. That cardiac workup started off when he was having some chest discomfort and that has since gone away since being on cardiac meds.  Patient denies any history of asthma. As mentioned he was diagnosed with COPD several years ago for which she is on Spiriva.  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  He was exposed to asbestos because of his work. Unfortunately, his wife was also exposed to [name] best as because of him. She ended up having severe complications related to that, ended up having oxygen, ended up dying secondary to complications of asbestos lung disease. She died 15 months ago. 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  Patient has lost 30 pounds over 4 years, unintentional. He has chronic dry cough for the most part. Sometimes with clear mucus.  He saw the pulmonologist in [place] in January, he had a walk test which showed that his oxygen level dropped. He was given oxygen but he felt he did not need it. He still has the oxygen at his house but he has not used it. He was also given stiolto when he saw pulm MD and used it for a month.  Not any different compared to spiriva.    He had a bout of bronchitis. In August 2007 for which she ended up going to the emergency room. He improved with antibiotics. Chest CT scan was done and will discuss in the problem list.  ROV 09/15/2016 patient returns to the office as follow-up on his dyspnea. Since last seen, he remains fairly functional. He is able to play golf 3 times a week. He tried Trelegy w/o much improvement. He is back at taking Spiriva daily. He has not been admitted nor been on antibiotics since last seen. Fairly functional. He walks 1.5 miles  couple times a week.   Review of Systems  Constitutional: Negative for fever and unexpected weight change ( 30lb x 3-4 years, not trying).  HENT: Negative for congestion, dental problem, ear pain, nosebleeds, postnasal drip, rhinorrhea, sinus pressure, sneezing, sore throat and trouble swallowing.        Allergies  Eyes: Negative for redness and itching.  Respiratory: Positive for shortness of breath. Negative for cough, chest tightness and wheezing.   Cardiovascular: Negative for palpitations and leg swelling.  Gastrointestinal: Negative for nausea and vomiting.    Genitourinary: Negative for dysuria.  Musculoskeletal: Negative for joint swelling.  Skin: Negative for rash.  Neurological: Negative for headaches.  Hematological: Does not bruise/bleed easily.  Psychiatric/Behavioral: Negative for dysphoric mood. The patient is not nervous/anxious.        Objective:   Physical Exam     Vitals:  Vitals:   09/15/16 0944  BP: 122/62  Pulse: (!) 54  SpO2: 95%  Weight: 132 lb 12.8 oz (60.2 kg)  Height: 5\' 8"  (1.727 m)    Constitutional/General:  Pleasant, well-nourished, well-developed, not in any distress,  Comfortably seating.  Well kempt  Body mass index is 20.19 kg/m. Wt Readings from Last 3 Encounters:  09/15/16 132 lb 12.8 oz (60.2 kg)  07/28/16 133 lb 9.6 oz (60.6 kg)  12/06/15 130 lb (59 kg)       HEENT: Pupils equal and reactive to light and accommodation. Anicteric sclerae. Normal nasal mucosa.   No oral  lesions,  mouth clear,  oropharynx clear, no postnasal drip. (-) Oral thrush. No dental caries.  Airway - Mallampati class II  Neck: No masses. Midline trachea. No JVD, (-) LAD. (-) bruits appreciated.  Respiratory/Chest: Grossly normal chest. (-) deformity. (-) Accessory muscle use.  Symmetric expansion. (-) Tenderness on palpation.  Resonant on percussion.  Diminished BS on both lower lung zones. (-) wheezing, rhonchi Some crackles in the bases.  (-) egophony  Cardiovascular: Regular rate and  rhythm, heart sounds normal, no murmur or gallops, no peripheral edema  Gastrointestinal:  Normal bowel sounds. Soft, non-tender. No hepatosplenomegaly.  (-) masses.   Musculoskeletal:  Normal muscle tone. Normal gait.   Extremities: Grossly normal. (-) clubbing, cyanosis.  (-) edema  Skin: (-) rash,lesions seen.   Neurological/Psychiatric : alert, oriented to time, place, person. 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 He was exposed to asbestos. His wife had asbestos lung disease and had complications from it. He retired in 1992.  Chest CT scan in August 2017 showed calcified pleural plaques as well as some honeycombing at the bases as well as an enlarged right hilar lymphadenopathy.   Chest ct scan in 07/2016 >> still with bilateral calcified plaques consistent with asbestos exposure. No pleural mass. No lymphadenopathy.  Plan for chest CT scan with contrast in 07/2017. Told the patient and daughter-in-law to have images burned on a disc so we can review and follow-up.  COPD (chronic obstructive pulmonary disease) (HCC) Pt with significant smoking history, quit 30 yrs ago.  Had asbestos exposure. Mod-severe copd based on chest ct scan. PFT in April 2018 with an FEV1 of 1.48 or 56% predicted, diffusion capacity 75%.  Better on spiriva.  Stiolto not much different.  trelegy did not make a lot of difference.  Very good fxnal capacity. Plays golf 2-3  times a week. Uses a treadmill several times a week.  Alpha one level was normal. Walk test in 07/2016 did not show desaturation.   Plan : We extensively discussed the diagnosis, pathophysiology, and medications  for COPD.   Patient will need : Nocturnal oximetry >> on f/u.  He wanted to hold off for now.   Cont spiriva >> was on the capsule and he wanted to switch to respimat.  Influenza vaccine -- UTD.  Pneumococcal vaccine -- received both after 83 yrs old  Patient was instructed to call the office if he/she has issues with the medications.   Patient was instructed to call the office if he/she is having issues with shortness of breath (i.e. COPD exacerbation).   Patient was instructed to call the office if with increased shortness of breath, cough, sputum production, fevers. Patient may need to be seen at the office for work-up.   Counseled patient on the importance of NOT smoking.    Exertional  dyspnea Multifactorial, related to the following: 1. moderate-severe COPD. PFT 07/2016  FEV1  1.48, 56%, DLCO 75%, hyperinflation.  2. Based on the chest CT scan in August 2017, he had calcified pleural plaques bilaterally as well as some fibrosis at the bases. Most likely related to asbestos lung disease. He also had a right hilar lymphadenopathy. He had a repeat chest CT scan in April 2018 which showed extensive bilateral pleural calcifications consistent with asbestos exposure. No pleural effusion or pleural mass seen. Moderate emphysematous changes. No lymphadenopathy. 3. CAD. On medical therapy.   Plan : 1. Rx COPD. Will discuss on a separate problem.  2. Rx CAD. Pt on cardiac meds. Medical management only.  3. Needs rpt chest ct scan in 1 yr 4. His dyspnea is stable. Did not need oxygen on last follow-up. He is fairly functional. Observe for now.     Patient [place] chest ct scan. Daughter-in-law lives in [place].    Pollie MeyerJ. Angelo A. de Dios, MD 09/15/2016   10:39 AM Pulmonary and Critical Care Medicine [facility] Pager: [phone] Office: [phone], Fax: [phone]

## 2016-09-15 NOTE — Addendum Note (Signed)
Addended by: Garfield CorneaMABRY, JASMINE L on: 09/15/2016 11:00 AM   Modules accepted: Orders

## 2016-09-15 NOTE — Addendum Note (Signed)
Addended by: Garfield CorneaMABRY, Lynnelle Mesmer L on: 09/15/2016 10:57 AM   Modules accepted: Orders

## 2016-09-15 NOTE — Progress Notes (Signed)
Pt was shown how to properly use his inhaler. He demonstrated back to me with the demo. He understood and had no further questions.

## 2016-09-15 NOTE — Assessment & Plan Note (Addendum)
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 He was exposed to asbestos. His wife had asbestos lung disease and had complications from it. He retired in 1992.  Chest CT scan in August 2017 showed calcified pleural plaques as well as some honeycombing at the bases as well as an enlarged right hilar lymphadenopathy.   Chest ct scan in 07/2016 >> still with bilateral calcified plaques consistent with asbestos exposure. No pleural mass. No lymphadenopathy.  Plan for chest CT scan with contrast in 07/2017. Told the patient and daughter-in-law to have images burned on a disc so we can review and follow-up.

## 2016-09-15 NOTE — Patient Instructions (Signed)
It was a pleasure taking care of you today!  You are diagnosed with Chronic Obstructive Pulmonary Disease or COPD.  COPD is a preventable and treatable disease that makes it difficult to empty air out of the lungs (airflow obstruction).  This can lead to shortness of breath.   Sometimes, when you have a lung infection, this can make your breathing worse, and will cause you to have a COPD flare-up or an acute exacerbation of COPD. Please call your primary care doctor or the office if you are having a COPD flare-up.   Smoking makes COPD worse.   Make sure you use your medications for COPD -- Maintenance medications : Spiriva respimat 2.5 mcg/puff, 2 puffs daily   Rescue medications: Albuterol 2 puffs every 4 hours as needed for shortness of breath.   Please rinse your mouth each time you use your maintenance medication.  Please call the office if you are having issues with your medications  We will schedule you for a chest CT scan in April. Follow-up with [facility] office after chest ct scan.

## 2016-09-15 NOTE — Assessment & Plan Note (Addendum)
Multifactorial, related to the following: 1. moderate-severe COPD. PFT 07/2016  FEV1  1.48, 56%, DLCO 75%, hyperinflation.  2. Based on the chest CT scan in August 2017, he had calcified pleural plaques bilaterally as well as some fibrosis at the bases. Most likely related to asbestos lung disease. He also had a right hilar lymphadenopathy. He had a repeat chest CT scan in April 2018 which showed extensive bilateral pleural calcifications consistent with asbestos exposure. No pleural effusion or pleural mass seen. Moderate emphysematous changes. No lymphadenopathy. 3. CAD. On medical therapy.   Plan : 1. Rx COPD. Will discuss on a separate problem.  2. Rx CAD. Pt on cardiac meds. Medical management only.  3. Needs rpt chest ct scan in 1 yr 4. His dyspnea is stable. Did not need oxygen on last follow-up. He is fairly functional. Observe for now.

## 2017-11-10 ENCOUNTER — Other Ambulatory Visit: Payer: Self-pay | Admitting: Adult Health

## 2017-11-10 MED ORDER — TIOTROPIUM BROMIDE MONOHYDRATE 2.5 MCG/ACT IN AERS: 2.0000 | INHALATION_SPRAY | Freq: Every day | RESPIRATORY_TRACT | 0 refills | Status: DC

## 2017-11-10 NOTE — Telephone Encounter (Signed)
Received faxed refill request from [facility] in [place] for Spiriva Respimat 2.635mcg  2puffs once daily.  Patient of AD last seen 5.24.18: Patient Instructions  It was a pleasure taking care of you today!   You are diagnosed with Chronic Obstructive Pulmonary Disease or COPD.  COPD is a preventable and treatable disease that makes it difficult to empty air out of the lungs (airflow obstruction).  This can lead to shortness of breath.    Sometimes, when you have a lung infection, this can make your breathing worse, and will cause you to have a COPD flare-up or an acute exacerbation of COPD. Please call your primary care doctor or the office if you are having a COPD flare-up.    Smoking makes COPD worse.    Make sure you use your medications for COPD -- Maintenance medications : Spiriva respimat 2.5 mcg/puff, 2 puffs daily    Rescue medications: Albuterol 2 puffs every 4 hours as needed for shortness of breath.    Please rinse your mouth each time you use your maintenance medication.   Please call the office if you are having issues with your medications   We will schedule you for a chest CT scan in April. Follow-up with [facility] after chest ct scan.     Patient never got CT nor followed up with the office as directed Will send 1 refill with note to pharmacy that patient is overdue for follow up  Nothing further needed at this time, will sign off

## 2018-01-07 DIAGNOSIS — F0390 Unspecified dementia without behavioral disturbance: Secondary | ICD-10-CM | POA: Insufficient documentation

## 2018-01-07 DIAGNOSIS — F09 Unspecified mental disorder due to known physiological condition: Secondary | ICD-10-CM | POA: Insufficient documentation

## 2018-01-25 ENCOUNTER — Other Ambulatory Visit: Payer: Self-pay | Admitting: Adult Health

## 2018-08-02 ENCOUNTER — Telehealth: Payer: Self-pay | Admitting: Pulmonary Disease

## 2018-08-02 MED ORDER — PREDNISONE 10 MG PO TABS: ORAL_TABLET | ORAL | 0 refills | Status: DC

## 2018-08-02 MED ORDER — DOXYCYCLINE HYCLATE 100 MG PO TABS: 100.0000 mg | ORAL_TABLET | Freq: Two times a day (BID) | ORAL | 0 refills | Status: DC

## 2018-08-02 MED FILL — predniSONE 10 MG TABS: 10 | 8 days supply | Qty: 20 | Fill #0

## 2018-08-02 MED FILL — DOXYCYCLINE HYCLATE 100 MG: 100 | 7 days supply | Qty: 14 | Fill #0

## 2018-08-02 NOTE — Telephone Encounter (Signed)
Called and spoke with pt regarding Jonathan Harmon recommendations below Placed order for Doxycycline 100mg  1 tab every 12 hours for 7 days Prednisone 10mg  tablet  >>>4 tabs for 2 days, then 3 tabs for 2 days, 2 tabs for 2 days, then 1 tab for 2 days, then stop Tried to scheduled appt in [place] with BQ, no schedule available for July 2020 in [place]; placed recall today Pt requested [place] [facility] pharmacy, placed both medications there today Scheduled f/u with Jonathan Harmon for 08/31/2018 at 4pm for COPD f/u for televisit Pt verbalized and expressed understanding Nothing further needed at this time.

## 2018-08-02 NOTE — Telephone Encounter (Signed)
Please send in the appropriate triage format.  Jonathan Harmon

## 2018-08-02 NOTE — Telephone Encounter (Signed)
We are trying to limit the number of patients coming in for visits at this office.  Due to patient's symptoms which likely represent a COPD exacerbation we can offer medical treatment.  Can offer:  Doxycycline >>> 1 100 mg tablet every 12 hours for 7 days >>>take with food  >>>wear sunscreen   Prednisone 10mg  tablet  >>>4 tabs for 2 days, then 3 tabs for 2 days, 2 tabs for 2 days, then 1 tab for 2 days, then stop >>>take with food  >>>take in the morning   Please place the orders  Please ensure the patient is scheduled for a follow-up with Dr. Kendrick Fries in June or July 2020 at [facility] location.  Can have a tentative follow-up with our office in 4 weeks, at this time I will recommend that this 4-week follow-up be telephonic.  Most likely at next office visit we can reevaluate patient's inhaler regimen which may need to be changed.  Elisha Headland, FNP

## 2018-08-02 NOTE — Telephone Encounter (Signed)
Primary Pulmonologist: DeDios 2018 - will be seeing BQ in [place] location Last office visit and with whom: DeDios on 09/15/2016 What do we see them for (pulmonary problems): SOB; Calcified pleural plaque due to asbestos exposure Last OV assessment/plan:  Editor: de Dios, Hilton Cork, MD (Physician)    It was a pleasure taking care of you today!  You are diagnosed with Chronic Obstructive Pulmonary Disease or COPD.  COPD is a preventable and treatable disease that makes it difficult to empty air out of the lungs (airflow obstruction).  This can lead to shortness of breath.   Sometimes, when you have a lung infection, this can make your breathing worse, and will cause you to have a COPD flare-up or an acute exacerbation of COPD. Please call your primary care doctor or the office if you are having a COPD flare-up.   Smoking makes COPD worse.   Make sure you use your medications for COPD -- Maintenance medications : Spiriva respimat 2.5 mcg/puff, 2 puffs daily   Rescue medications: Albuterol 2 puffs every 4 hours as needed for shortness of breath.   Please rinse your mouth each time you use your maintenance medication.  Please call the office if you are having issues with your medications  We will schedule you for a chest CT scan in April. Follow-up with [facility] office after chest ct scan.       Was appointment offered to patient (explain)?  Pt is requesting face to face in office this week if possible; offered televisit today. Pt stated that he would like to do televisit if possible regarding increase SOB.  Reason for call: Pt reports increase of SOB, productive cough-yellow, and chest tightness. Pt has no wheezing, no fever, no chills, no body aches, and has not travelled nor been around anyone that has travelled in last month. Pt has not been around anyone sick. Pt walks daily, and all this week has had a tougher time walking with increase of SOB with exertion. Pt is  not using albuterol rescue inhaler at all. He has several inhalers listed on the medication list, went over the medication list with patient. And he is only taking spririva handihaler when he walks daily. He states it is not helping him. Pt had an pulmonary consult with BQ in [place] but had to cancelled due to covid19, and would like some help with SOB.  B.Mack please advise.

## 2018-08-02 NOTE — Telephone Encounter (Signed)
Called and spoke with pt who has complaints of increased SOB, productive cough since October but  has become worse recently. Pt has been taking allergy meds due to pollen.  Pt does go walking but stated he has not been able to walk as far as usual due to breathing and also has been feeling weaker.  Pt has not had any fever, no recent travel, and has not been around anyone that has been sick.  Pt has taken spiriva handihaler daily, zyrtec, and also takes two naproxen at bedtime.   Stated to pt that we should schedule him a televisit but he is really wanting to come into the office for a face-to-face visit.  Pt is a former de Dios pt last seen at office 09/15/16 and had an appt scheduled with BQ in [place] but that appt had to be cancelled. Pt is willing to go to [place] office if he can be able to come into office for an appt.  [name], please advise if it is okay for pt to have a face-to-face visit or if we really should just schedule a televisit for pt. Thanks!

## 2018-08-06 ENCOUNTER — Ambulatory Visit: Payer: Medicare Other | Admitting: Pulmonary Disease

## 2018-08-31 ENCOUNTER — Ambulatory Visit (INDEPENDENT_AMBULATORY_CARE_PROVIDER_SITE_OTHER): Payer: Medicare Other | Admitting: Pulmonary Disease

## 2018-08-31 ENCOUNTER — Other Ambulatory Visit: Payer: Self-pay

## 2018-08-31 ENCOUNTER — Encounter: Payer: Self-pay | Admitting: Pulmonary Disease

## 2018-08-31 VITALS — BP 128/78 | HR 91 | Ht 69.0 in | Wt 121.2 lb

## 2018-08-31 DIAGNOSIS — J849 Interstitial pulmonary disease, unspecified: Secondary | ICD-10-CM | POA: Diagnosis not present

## 2018-08-31 DIAGNOSIS — J920 Pleural plaque with presence of asbestos: Secondary | ICD-10-CM | POA: Diagnosis not present

## 2018-08-31 DIAGNOSIS — J309 Allergic rhinitis, unspecified: Secondary | ICD-10-CM | POA: Diagnosis not present

## 2018-08-31 DIAGNOSIS — J439 Emphysema, unspecified: Secondary | ICD-10-CM

## 2018-08-31 MED ORDER — UMECLIDINIUM-VILANTEROL 62.5-25 MCG/INH IN AEPB: 1.0000 | INHALATION_SPRAY | Freq: Every day | RESPIRATORY_TRACT | 0 refills | Status: DC

## 2018-08-31 NOTE — Assessment & Plan Note (Addendum)
Assessment: 2018 pulmonary function test shows FEV1 1.41 (54% predicted), DLCO 75 2018 CT chest shows emphysema Former smoker, 40-pack-year smoking history Environmental exposures is a [employer], multimedia Asbestos exposure 2018 CT shows calcified pleural plaques Maintained on Spiriva HandiHaler, patient struggles with medication adherence mMRC 1 today Patient walks 3-4 times weekly Lungs clear to auscultation today  Plan: Trial of Anoro Ellipta inhaler today Hold Spiriva HandiHaler while trialing Anoro Patient to contact our office if he enjoys the [employer] inhaler so we can place a prescription Continue to remain active Start Zyrtec 10 mg daily for management of postnasal drip and allergy symptoms Repeat CT chest without contrast Follow-up in 3 months to establish with Dr. Kendrick Fries in [facility]

## 2018-08-31 NOTE — Assessment & Plan Note (Signed)
Assessment: 2017 and 2018 CT chest shows extensive bilateral pleural calcifications consistent with bilateral asbestos exposure Known asbestos exposure  Plan: Repeat CT chest without contrast

## 2018-08-31 NOTE — Patient Instructions (Addendum)
Trial of Anoro Ellipta  >>> Take 1 puff daily in the morning right when you wake up >>>Rinse your mouth out after use >>>This is a daily maintenance inhaler, NOT a rescue inhaler >>>Contact our office if you are having difficulties affording or obtaining this medication >>>It is important for you to be able to take this daily and not miss any doses    Stop Spiriva HandiHaler while on Anoro    Restart zyrtec 10mg  daily   We will repeat a CT of your chest     Note your daily symptoms > remember "red flags" for COPD:   >>>Increase in cough >>>increase in sputum production >>>increase in shortness of breath or activity  intolerance.   If you notice these symptoms, please call the office to be seen.      Return in about 3 months (around 12/01/2018), or if symptoms worsen or fail to improve, for Follow up with Elisha Headland FNP-C, Follow up with Dr. Kendrick Fries.   Coronavirus (COVID-19) Are you at risk?  Are you at risk for the Coronavirus (COVID-19)?  To be considered HIGH RISK for Coronavirus (COVID-19), you have to meet the following criteria:  . Traveled to [place], [place], [place], [place] or [place]; or in the [place] to [place], [place], [place], or [place]; and have fever, cough, and shortness of breath within the last 2 weeks of travel OR . Been in close contact with a person diagnosed with COVID-19 within the last 2 weeks and have fever, cough, and shortness of breath . IF YOU DO NOT MEET THESE CRITERIA, YOU ARE CONSIDERED LOW RISK FOR COVID-19.  What to do if you are HIGH RISK for COVID-19?  Marland Kitchen If you are having a medical emergency, call 911. . Seek medical care right away. Before you go to a doctor's office, urgent care or emergency department, call ahead and tell them about your recent travel, contact with someone diagnosed with COVID-19, and your symptoms. You should receive instructions from your physician's office regarding next steps of care.  . When you  arrive at healthcare provider, tell the healthcare staff immediately you have returned from visiting [place], [place], [place], [place] or [place]; or traveled in the [place] to [place], [place], [place], or [place]; in the last two weeks or you have been in close contact with a person diagnosed with COVID-19 in the last 2 weeks.   . Tell the health care staff about your symptoms: fever, cough and shortness of breath. . After you have been seen by a medical provider, you will be either: o Tested for (COVID-19) and discharged home on quarantine except to seek medical care if symptoms worsen, and asked to  - Stay home and avoid contact with others until you get your results (4-5 days)  - Avoid travel on public transportation if possible (such as bus, train, or airplane) or o Sent to the Emergency Department by EMS for evaluation, COVID-19 testing, and possible admission depending on your condition and test results.  What to do if you are LOW RISK for COVID-19?  Reduce your risk of any infection by using the same precautions used for avoiding the common cold or flu:  Marland Kitchen Wash your hands often with soap and warm water for at least 20 seconds.  If soap and water are not readily available, use an alcohol-based hand sanitizer with at least 60% alcohol.  . If coughing or sneezing, cover your mouth and nose by coughing  or sneezing into the elbow areas of your shirt or coat, into a tissue or into your sleeve (not your hands). . Avoid shaking hands with others and consider head nods or verbal greetings only. . Avoid touching your eyes, nose, or mouth with unwashed hands.  . Avoid close contact with people who are sick. . Avoid places or events with large numbers of people in one location, like concerts or sporting events. . Carefully consider travel plans you have or are making. . If you are planning any travel outside or inside the [place], visit the CDC's Travelers' Health webpage for the latest health  notices. . If you have some symptoms but not all symptoms, continue to monitor at home and seek medical attention if your symptoms worsen. . If you are having a medical emergency, call 911.   ADDITIONAL HEALTHCARE OPTIONS FOR PATIENTS  [place] Tele[facility] / e-Visit: [email or web address]         [facility]: [phone]  [facility]: [phone]                   [facility]: [phone]           It is flu season:   >>> Best ways to protect herself from the flu: Receive the yearly flu vaccine, practice good hand hygiene washing with soap and also using hand sanitizer when available, eat a nutritious meals, get adequate rest, hydrate appropriately   Please contact the office if your symptoms worsen or you have concerns that you are not improving.   Thank you for choosing  [facility] for your healthcare, and for allowing us to partner with you on your healthcare journey. I am thankful to be able to provide care to you today.   Elisha HeadlandBrian Tyheem Boughner FNP-C    Chronic Obstructive Pulmonary Disease Chronic obstructive pulmonary disease (COPD) is a long-term (chronic) lung problem. When you have COPD, it is hard for air to get in and out of your lungs. Usually the condition gets worse over time, and your lungs will never return to normal. There are things you can do to keep yourself as healthy as possible.  Your doctor may treat your condition with: ? Medicines. ? Oxygen. ? Lung surgery.  Your doctor may also recommend: ? Rehabilitation. This includes steps to make your body work better. It may involve a team of specialists. ? Quitting smoking, if you smoke. ? Exercise and changes to your diet. ? Comfort measures (palliative care). Follow these instructions at home: Medicines  Take over-the-counter and prescription medicines only as told by your doctor.  Talk to your doctor before  taking any cough or allergy medicines. You may need to avoid medicines that cause your lungs to be dry. Lifestyle  If you smoke, stop. Smoking makes the problem worse. If you need help quitting, ask your doctor.  Avoid being around things that make your breathing worse. This may include smoke, chemicals, and fumes.  Stay active, but remember to rest as well.  Learn and use tips on how to relax.  Make sure you get enough sleep. Most adults need at least 7 hours of sleep every night.  Eat healthy foods. Eat smaller meals more often. Rest before meals. Controlled breathing Learn and use tips on how to control your breathing as told by your doctor. Try:  Breathing in (inhaling) through your nose for 1 second. Then, pucker your lips and breath out (exhale) through your lips for 2 seconds.  Putting one hand on your belly (abdomen). Breathe in slowly through your nose for 1 second. Your hand on your belly should move out. Pucker your lips and breathe out slowly through your lips. Your hand on your belly should move in as you breathe out.  Controlled coughing Learn and use controlled coughing to clear mucus from your lungs. Follow these steps: 1. Lean your head a little forward. 2. Breathe in deeply. 3. Try to hold your breath for 3 seconds. 4. Keep your mouth slightly open while coughing 2 times. 5. Spit any mucus out into a tissue. 6. Rest and do the steps again 1 or 2 times as needed. General instructions  Make sure you get all the shots (vaccines) that your doctor recommends. Ask your doctor about a flu shot and a pneumonia shot.  Use oxygen therapy and pulmonary rehabilitation if told by your doctor. If you need home oxygen therapy, ask your doctor if you should buy a tool to measure your oxygen level (oximeter).  Make a COPD action plan with your doctor. This helps you to know what to do if you feel worse than usual.  Manage any other conditions you have as told by your  doctor.  Avoid going outside when it is very hot, cold, or humid.  Avoid people who have a sickness you can catch (contagious).  Keep all follow-up visits as told by your doctor. This is important. Contact a doctor if:  You cough up more mucus than usual.  There is a change in the color or thickness of the mucus.  It is harder to breathe than usual.  Your breathing is faster than usual.  You have trouble sleeping.  You need to use your medicines more often than usual.  You have trouble doing your normal activities such as getting dressed or walking around the house. Get help right away if:  You have shortness of breath while resting.  You have shortness of breath that stops you from: ? Being able to talk. ? Doing normal activities.  Your chest hurts for longer than 5 minutes.  Your skin color is more blue than usual.  Your pulse oximeter shows that you have low oxygen for longer than 5 minutes.  You have a fever.  You feel too tired to breathe normally. Summary  Chronic obstructive pulmonary disease (COPD) is a long-term lung problem.  The way your lungs work will never return to normal. Usually the condition gets worse over time. There are things you can do to keep yourself as healthy as possible.  Take over-the-counter and prescription medicines only as told by your doctor.  If you smoke, stop. Smoking makes the problem worse. This information is not intended to replace advice given to you by your health care provider. Make sure you discuss any questions you have with your health care provider. Document Released: 09/28/2007 Document Revised: 05/16/2016 Document Reviewed: 05/16/2016 Elsevier Interactive Patient Education  2019 [employer].

## 2018-08-31 NOTE — Progress Notes (Signed)
  ID: Jonathan Harmon, male    DOB: December 17, 1933, 85 y.o.   MRN: [id number]  Chief Complaint  Patient presents with   Follow-up    2-year COPD follow-up, history of calcified pleural plaques on CT, asbestos exposure    Referring provider: No ref. provider found  HPI:  85 year old male former smoker followed in our office for COPD  Smoker/ Smoking History: Former Smoker. Quit 2002. 40 pack year smoking history  Maintenance:  Spiriva  Pt of: Former patient of Dr. Eyvonne Mechanic  Recent Learned Pulmonary Encounters:   Last seen in 2018  Plan was CT chest in 07/2017 Continue on Spiriva  08/31/2018  - Visit   85 year old male former smoker followed in our office for COPD as well as asbestosis on CT.  Patient is a former patient of Dr. Arley Phenix.  He is maintained on Spiriva HandiHaler.  He was last seen in our office in 2018.  Patient contacted our office in April/2020 with acute symptoms of shortness of breath, wheezing as well as productive cough with purulent sputum.  Patient was treated telephonically as a COPD exacerbation with doxycycline and prednisone.  Patient reports that he only uses his Spiriva HandiHaler 3-4 times a week on days where he is walking.  He still plays golf occasionally but not as much due to shoulder pain.  He reports that he was not having the shoulder pain he could still play golf 3 times a week without shortness of breath.  Patient does have an ongoing productive cough with clear sputum that is occasionally yellow.  Patient is unsure if he truly needs to use an inhaler every day.  He has tried Stiolto in the past and did not notice any improvement.  He admits that with the Spiriva HandiHaler he sometimes wonders if there is a different inhaler or better inhaler for him to be on.  Patient was supposed to complete a CT follow-up in April/2019.  Patient does not believe that he is completed this.  Patient's daughter has written down to the patient was scheduled to  complete this but she is unsure.  Patient now lives in [facility] [place] so CT follow-up should be easier as it will be completed within the [facility] system.  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  Patient spouse also had asbestos exposure likely from the patient's close.  Patient spouse had significant breathing complications from asbestosis.   MMRC - Breathlessness Score 1 - I get short of breath when hurrying on level ground or walking up a slight hill     Tests:   08/09/2016-CT chest with contrast- extensive bilateral pleural calcifications consistent with bilateral asbestos exposure, no pleural effusion or pleural mass seen, moderate emphysematous changes seen scattered areas between both lungs  12/06/2015- CTA- no evidence of acute PE, bilateral calcified pleural plaques consistent with history of asbestos exposure, there is interstitial lung disease with early honeycombing at the lung bases, given history of asbestos exposure asbestosis with pulmonary fibrosis is suspected, indeterminate 9 mm pulmonary parenchymal opacity right apex  08/09/2016-pulmonary function test- FVC 3.33 (90% predicted), postbronchodilator ratio 43, postbronchodilator FEV1 1.41 (54% predicted), DLCO 75    FENO:  No results found for: NITRICOXIDE  PFT: No flowsheet data found.  Imaging: No results found.    Specialty Problems      Pulmonary Problems   Calcified pleural plaque due to asbestos exposure    12/06/2015- CTA- no evidence of  acute PE, bilateral calcified pleural plaques consistent with history of asbestos exposure, there is interstitial lung disease with early honeycombing at the lung bases, given history of asbestos exposure asbestosis with pulmonary fibrosis is suspected, indeterminate 9 mm pulmonary parenchymal opacity right apex  08/09/2016-CT chest with contrast- extensive bilateral pleural calcifications consistent with  bilateral asbestos exposure, no pleural effusion or pleural mass seen, moderate emphysematous changes seen scattered areas between both lungs  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 Spouse of patient with known asbestos exposure likely from spouse as close       COPD (chronic obstructive pulmonary disease) (HCC)    08/09/2016-pulmonary function test- FVC 3.33 (90% predicted), postbronchodilator ratio 43, postbronchodilator FEV1 1.41 (54% predicted), DLCO 75  08/09/2016-CT chest with contrast- extensive bilateral pleural calcifications consistent with bilateral asbestos exposure, no pleural effusion or pleural mass seen, moderate emphysematous changes seen scattered areas between both lungs       Exertional dyspnea   Allergic rhinitis      Allergies  Allergen Reactions   Penicillins Anaphylaxis   Penicillin G Swelling    Immunization History  Administered Date(s) Administered   Influenza Split 01/20/2015, 01/28/2016   Pneumococcal Polysaccharide-23 12/08/2011    Past Medical History:  Diagnosis Date   Asbestos exposure    diminished lung compacity from asbestos exposure 30 years   Pneumonia    x 4    Tobacco History: Social History   Tobacco Use  Smoking Status Former Smoker   Packs/day: 1.00   Years: 40.00   Pack years: 40.00   Types: Cigarettes   Last attempt to quit: 04/25/2000   Years since quitting: 18.3  Smokeless Tobacco Never Used   Counseling given: Yes  Continue to not smoke  Outpatient Encounter Medications as of 08/31/2018  Medication Sig   tiotropium (SPIRIVA) 18 MCG inhalation capsule Place 18 mcg into inhaler and inhale daily.   atorvastatin (LIPITOR) 40 MG tablet Take 40 mg by mouth daily.   cilostazol (PLETAL) 100 MG tablet Take 100 mg by mouth 2 (two) times daily.   metoprolol succinate (TOPROL-XL) 25 MG 24 hr tablet Take 25 mg by mouth daily.   umeclidinium-vilanterol (ANORO ELLIPTA) 62.5-25 MCG/INH AEPB Inhale 1  puff into the lungs daily.   [DISCONTINUED] doxycycline (VIBRA-TABS) 100 MG tablet Take 1 tablet (100 mg total) by mouth 2 (two) times daily.   [DISCONTINUED] predniSONE (DELTASONE) 10 MG tablet Take 4 tabs for 2 days, then 3 tabs for 2 days, 2 tabs for 2 days, then 1 tab for 2 days, then stop   No facility-administered encounter medications on file as of 08/31/2018.      Review of Systems  Review of Systems  Constitutional: Positive for fatigue. Negative for activity change, chills, fever and unexpected weight change.  HENT: Negative for postnasal drip, rhinorrhea, sinus pressure, sinus pain, sneezing and sore throat.   Eyes: Negative.   Respiratory: Positive for cough and wheezing. Negative for shortness of breath.   Cardiovascular: Negative for chest pain, palpitations and leg swelling.  Gastrointestinal: Negative for constipation, diarrhea, nausea and vomiting.  Endocrine: Negative.   Musculoskeletal: Negative.   Skin: Negative.   Neurological: Negative for dizziness and headaches.  Psychiatric/Behavioral: Negative.  Negative for dysphoric mood. The patient is not nervous/anxious.   All other systems reviewed and are negative.    Physical Exam  BP 128/78 (BP Location: Left Arm, Cuff Size: Normal)    Pulse 91    Ht 5\' 9"  (1.753 m)  Wt 121 lb 3.2 oz (55 kg)    SpO2 92%    BMI 17.90 kg/m   Wt Readings from Last 5 Encounters:  08/31/18 121 lb 3.2 oz (55 kg)  09/15/16 132 lb 12.8 oz (60.2 kg)  07/28/16 133 lb 9.6 oz (60.6 kg)  12/06/15 130 lb (59 kg)     Physical Exam  Constitutional: He is oriented to person, place, and time and well-developed, well-nourished, and in no distress. No distress.  Thin Elderly adult male  HENT:  Head: Normocephalic and atraumatic.  Right Ear: Hearing, tympanic membrane, external ear and ear canal normal.  Left Ear: Hearing, tympanic membrane, external ear and ear canal normal.  Nose: Mucosal edema and rhinorrhea present.  Mouth/Throat:  Uvula is midline and oropharynx is clear and moist. No oropharyngeal exudate.  Postnasal drip  Eyes: Pupils are equal, round, and reactive to light.  Neck: Normal range of motion. Neck supple.  Cardiovascular: Normal rate, regular rhythm and normal heart sounds.  Pulmonary/Chest: Effort normal and breath sounds normal. No accessory muscle usage. No respiratory distress. He has no decreased breath sounds. He has no wheezes. He has no rhonchi. He has no rales.  Abdominal: Soft. Bowel sounds are normal. There is no abdominal tenderness.  Musculoskeletal: Normal range of motion.        General: No edema.  Lymphadenopathy:    He has no cervical adenopathy.  Neurological: He is alert and oriented to person, place, and time. Gait normal.  Skin: Skin is warm and dry. He is not diaphoretic. No erythema.  Psychiatric: Mood and judgment normal. His mood appears not anxious. He does not exhibit a depressed mood. He has a flat affect.  Occasionally confused and struggles with health history recall, daughter helps to remind patient  Nursing note and vitals reviewed.     Lab Results:  CBC    Component Value Date/Time   WBC 13.2 (H) 12/06/2015 1200   RBC 4.39 12/06/2015 1200   HGB 14.0 12/06/2015 1200   HCT 41.1 12/06/2015 1200   PLT 229 12/06/2015 1200   MCV 93.6 12/06/2015 1200   MCH 31.9 12/06/2015 1200   MCHC 34.1 12/06/2015 1200   RDW 12.6 12/06/2015 1200   LYMPHSABS 1.1 12/06/2015 1200   MONOABS 0.8 12/06/2015 1200   EOSABS 0.1 12/06/2015 1200   BASOSABS 0.0 12/06/2015 1200    BMET    Component Value Date/Time   NA 140 07/28/2016 1403   K 5.1 07/28/2016 1403   CL 106 07/28/2016 1403   CO2 31 07/28/2016 1403   GLUCOSE 98 07/28/2016 1403   BUN 22 07/28/2016 1403   CREATININE 1.11 07/28/2016 1403   CALCIUM 9.7 07/28/2016 1403   GFRNONAA 57 (L) 12/06/2015 1200   GFRAA >60 12/06/2015 1200    BNP No results found for: BNP  ProBNP No results found for:  PROBNP    Assessment & Plan:     COPD (chronic obstructive pulmonary disease) (HCC) Assessment: 2018 pulmonary function test shows FEV1 1.41 (54% predicted), DLCO 75 2018 CT chest shows emphysema Former smoker, 40-pack-year smoking history 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 Kendrick Fries in [facility]  Calcified pleural plaque due to asbestos exposure Assessment: 2017 and 2018 CT chest shows extensive bilateral pleural calcifications consistent with bilateral asbestos exposure Known asbestos exposure  Plan: Repeat CT chest without contrast   Allergic rhinitis Assessment: Rhinorrhea on exam today Postnasal drip on exam today Cough with clear mucus  Plan: Start Zyrtec 10 mg daily     Coral Ceo, NP 08/31/2018   This appointment was 25 min long with over 50% of the time in direct face-to-face patient care, assessment, plan of care, and follow-up.

## 2018-08-31 NOTE — Assessment & Plan Note (Signed)
Assessment: Rhinorrhea on exam today Postnasal drip on exam today Cough with clear mucus  Plan: Start Zyrtec 10 mg daily

## 2018-09-01 NOTE — Progress Notes (Signed)
Reviewed, agree 

## 2018-09-03 ENCOUNTER — Telehealth: Payer: Self-pay | Admitting: Pulmonary Disease

## 2018-09-03 MED ORDER — TIOTROPIUM BROMIDE-OLODATEROL 2.5-2.5 MCG/ACT IN AERS: 2.0000 | INHALATION_SPRAY | Freq: Every day | RESPIRATORY_TRACT | 0 refills | Status: DC

## 2018-09-03 NOTE — Telephone Encounter (Signed)
Spoke with patient's son Jonathan Harmon since [name] is not on patient's DPR. [name] stated that the patient was given a sample of Anoro during his 5/8 visit. Patient advised him that Anoro is not working, it is actually causing him to become more SOB. Patient went on his usual Sunday walk yesterday and could not finish due to the SOB.   Per [name] works in the pharmacy at [employer] and wanted to see if Jonathan Harmon would benefit from [name] instead of the Anoro. Advised him I would send a message over to [place] to see.   Also advised [name] that when the patient comes back into the office, to add [name] to the [facility] so that we can speak with her. He verbalized understanding.   [name], please advise on switching to [facility]. Thanks!

## 2018-09-03 NOTE — Telephone Encounter (Signed)
Ok with stopping Anoro. Start Stiolto.   Stiolto Respimat inhaler >>>2 puffs daily >>>Take this no matter what >>>This is not a rescue inhaler  Can provide sample.   Pt will need to come in and be instructed on how to use inhaler.   Elisha Headland FNP

## 2018-09-03 NOTE — Telephone Encounter (Signed)
Left message for Jonathan Harmon to call back. Will go ahead and place sample up front for pickup. Will leave this encounter open until he calls back.

## 2018-09-04 NOTE — Telephone Encounter (Signed)
LVM for patient to call back. X2

## 2018-09-05 MED ORDER — TIOTROPIUM BROMIDE-OLODATEROL 2.5-2.5 MCG/ACT IN AERS: 2.0000 | INHALATION_SPRAY | Freq: Every day | RESPIRATORY_TRACT | 0 refills | Status: DC

## 2018-09-05 NOTE — Telephone Encounter (Signed)
Spoke with pt and advised message from [place]. Pt will come in today at 1pm to pick up sample and have nurse teach him how to use the inhaler. I will leave the sample in the foyer and advise nurse that patient will be coming in. Nothing further is needed.

## 2018-09-05 NOTE — Addendum Note (Signed)
Addended by: Cydney Ok on: 09/05/2018 09:29 AM   Modules accepted: Orders

## 2018-09-06 ENCOUNTER — Telehealth: Payer: Self-pay | Admitting: Pulmonary Disease

## 2018-09-06 ENCOUNTER — Other Ambulatory Visit: Payer: Self-pay

## 2018-09-06 ENCOUNTER — Ambulatory Visit (HOSPITAL_BASED_OUTPATIENT_CLINIC_OR_DEPARTMENT_OTHER)
Admission: RE | Admit: 2018-09-06 | Discharge: 2018-09-06 | Disposition: A | Discharge status: Home / Self Care | Payer: Medicare Other | Source: Ambulatory Visit | Attending: Pulmonary Disease | Admitting: Pulmonary Disease

## 2018-09-06 DIAGNOSIS — J439 Emphysema, unspecified: Secondary | ICD-10-CM | POA: Diagnosis present

## 2018-09-06 DIAGNOSIS — J849 Interstitial pulmonary disease, unspecified: Secondary | ICD-10-CM | POA: Diagnosis present

## 2018-09-06 DIAGNOSIS — J920 Pleural plaque with presence of asbestos: Secondary | ICD-10-CM | POA: Insufficient documentation

## 2018-09-06 NOTE — Telephone Encounter (Signed)
Already addressed on CT report. Will order High Res CT in 3 months.   [name]

## 2018-09-06 NOTE — Telephone Encounter (Signed)
Received call report from Diane with [facility] on patient's CT chest done on 09/06/18.  [name], NP please review the result/impression copied below:   IMPRESSION: 1. New linear thickening in the RIGHT upper lobe. Recommend short-term CT follow-up in 3 months. 2. Extensive calcified pleural plaques unchanged. 3. Centrilobular emphysema the upper lobes and bronchiectasis in lower lobes. No frank honeycombing.     Please advise, thank you.  Message routed to Elisha Headland, NP

## 2018-09-06 NOTE — Telephone Encounter (Signed)
Noted  

## 2018-09-06 NOTE — Progress Notes (Signed)
CT results have come back.  Showing some linear thickening in her right upper lobe.  Still showing chronic extensive calcified pleural plaques.  Showing centrilobular emphysema bronchiectasis and lower lobes.  We will follow this with another CT in 3 months.    Jonathan Harmon, please order a high-resolution CT scan to be completed in 3 months.   Please put in the comments section of the high res order: -High-res CT with supine and prone positioning -inspiratory and expiratory cuts.  -Only to be read by Dr. Fredirick Lathe and Dr. Llana Aliment.    Elisha Headland FNP

## 2018-10-08 ENCOUNTER — Telehealth: Payer: Self-pay | Admitting: Pulmonary Disease

## 2018-10-08 MED ORDER — ALBUTEROL SULFATE HFA 108 (90 BASE) MCG/ACT IN AERS: 2.0000 | INHALATION_SPRAY | Freq: Four times a day (QID) | RESPIRATORY_TRACT | 6 refills | Status: DC | PRN

## 2018-10-08 MED FILL — ALBUTEROL SULFATE HFA 108 (: 108 (90 BAS | 25 days supply | Qty: 9 | Fill #0

## 2018-10-08 NOTE — Telephone Encounter (Signed)
Spoke with Jonathan Harmon pts son Family states pt likes to walk and is struggling with sob during his walk. Pt is using Stiolto qd. Family requesting rx Albuterol be sent to [place].    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 [facility] Bells in [facility]  Calcified pleural plaque due to asbestos exposure Assessment: 2017 and 2018 CT chest shows extensive bilateral pleural calcifications consistent with bilateral asbestos exposure Known asbestos exposure  Plan: Repeat CT chest without contrast   Allergic rhinitis Assessment: Rhinorrhea on exam today Postnasal drip on exam today Cough with clear mucus  Plan: Start Zyrtec 10 mg daily     Lauraine Rinne, NP 08/31/2018

## 2018-10-08 NOTE — Telephone Encounter (Signed)
sent 

## 2018-10-09 NOTE — Telephone Encounter (Signed)
Attempted to call pt's daughter-in-law Jeannie Done but unable to reach. Left message for Jeannie Done to return call. Also attempted to call pt's son Octavia Bruckner but each time I tried calling, immediately received a busy signal and unable to leave a message.  Beth did send Rx for albuterol inhaler to pt's pharmacy for pt.

## 2018-10-10 NOTE — Telephone Encounter (Signed)
Spoke with pt's daughter in [place], [place]. States that she picked up this prescription on Monday. Nothing further was needed.

## 2018-10-29 ENCOUNTER — Telehealth: Payer: Self-pay | Admitting: Pulmonary Disease

## 2018-10-29 MED ORDER — STIOLTO RESPIMAT 2.5-2.5 MCG/ACT IN AERS: 2.0000 | INHALATION_SPRAY | Freq: Every day | RESPIRATORY_TRACT | 3 refills | Status: DC

## 2018-10-29 MED FILL — STIOLTO RESPIMAT INHAL SPRY: 2.5-2.5 | 30 days supply | Qty: 4 | Fill #0

## 2018-10-29 NOTE — Telephone Encounter (Signed)
ATC pt, no answer. Left message for pt to call back.  Rx for [employer] sent in [facility].

## 2018-10-30 NOTE — Telephone Encounter (Signed)
Spoke with pt's son, Gershon Mussel. Advised him that we have sent in the pt's prescription. Nothing further was needed.

## 2018-12-10 ENCOUNTER — Telehealth: Payer: Self-pay | Admitting: Pulmonary Disease

## 2018-12-10 NOTE — Telephone Encounter (Signed)
Left message for patient's son Octavia Bruckner to call back. Patient's daughter in law is not listed on any DPRs in his chart.

## 2018-12-12 ENCOUNTER — Ambulatory Visit (HOSPITAL_BASED_OUTPATIENT_CLINIC_OR_DEPARTMENT_OTHER)
Admission: RE | Admit: 2018-12-12 | Discharge: 2018-12-12 | Disposition: A | Discharge status: Home / Self Care | Payer: Medicare Other | Source: Ambulatory Visit | Attending: Pulmonary Disease | Admitting: Pulmonary Disease

## 2018-12-12 ENCOUNTER — Other Ambulatory Visit: Payer: Self-pay

## 2018-12-12 DIAGNOSIS — J439 Emphysema, unspecified: Secondary | ICD-10-CM | POA: Insufficient documentation

## 2018-12-12 DIAGNOSIS — J920 Pleural plaque with presence of asbestos: Secondary | ICD-10-CM | POA: Diagnosis present

## 2018-12-12 NOTE — Progress Notes (Signed)
Stable CT chest. Stable pulmonary nodule in right lung. Still showing emphysema. Still showing calcified pleural plaques from previous asbestos exposure.   Continue forward with appt to establish with Dr. Lamonte Sakai. CT can be reviewed in more detail then.   Wyn Quaker FNP

## 2018-12-13 ENCOUNTER — Telehealth: Payer: Self-pay | Admitting: Medical

## 2018-12-13 ENCOUNTER — Ambulatory Visit (HOSPITAL_BASED_OUTPATIENT_CLINIC_OR_DEPARTMENT_OTHER)
Admission: RE | Admit: 2018-12-13 | Discharge: 2018-12-13 | Disposition: A | Discharge status: Home / Self Care | Payer: Medicare Other | Source: Ambulatory Visit | Attending: Medical | Admitting: Medical

## 2018-12-13 ENCOUNTER — Encounter: Payer: Self-pay | Admitting: Medical

## 2018-12-13 ENCOUNTER — Ambulatory Visit (INDEPENDENT_AMBULATORY_CARE_PROVIDER_SITE_OTHER): Payer: Medicare Other | Admitting: Medical

## 2018-12-13 VITALS — BP 140/84 | HR 91 | Temp 97.3°F | Resp 16 | Ht 69.0 in | Wt 116.0 lb

## 2018-12-13 DIAGNOSIS — M25519 Pain in unspecified shoulder: Secondary | ICD-10-CM | POA: Diagnosis present

## 2018-12-13 DIAGNOSIS — I7090 Unspecified atherosclerosis: Secondary | ICD-10-CM | POA: Diagnosis not present

## 2018-12-13 DIAGNOSIS — G8929 Other chronic pain: Secondary | ICD-10-CM

## 2018-12-13 DIAGNOSIS — J439 Emphysema, unspecified: Secondary | ICD-10-CM

## 2018-12-13 DIAGNOSIS — E785 Hyperlipidemia, unspecified: Secondary | ICD-10-CM

## 2018-12-13 DIAGNOSIS — I10 Essential (primary) hypertension: Secondary | ICD-10-CM | POA: Diagnosis not present

## 2018-12-13 LAB — LIPID PANEL
Cholesterol: 175 mg/dL (ref 0–200)
HDL: 47.9 mg/dL (ref >39.00)
LDL Cholesterol: 109 mg/dL — ABNORMAL HIGH (ref 0–99)
NonHDL: 127.41
Total CHOL/HDL Ratio: 4
Triglycerides: 94 mg/dL (ref 0.0–149.0)
VLDL: 18.8 mg/dL (ref 0.0–40.0)

## 2018-12-13 LAB — COMPREHENSIVE METABOLIC PANEL
ALT: 9 U/L (ref 0–53)
AST: 18 U/L (ref 0–37)
Albumin: 4 g/dL (ref 3.5–5.2)
Alkaline Phosphatase: 90 U/L (ref 39–117)
BUN: 23 mg/dL (ref 6–23)
CO2: 30 mEq/L (ref 19–32)
Calcium: 9.7 mg/dL (ref 8.4–10.5)
Chloride: 99 mEq/L (ref 96–112)
Creatinine, Ser: 1.11 mg/dL (ref 0.40–1.50)
GFR: 63.28 mL/min (ref >60.00)
Glucose, Bld: 100 mg/dL — ABNORMAL HIGH (ref 70–99)
Potassium: 5.2 mEq/L — ABNORMAL HIGH (ref 3.5–5.1)
Sodium: 136 mEq/L (ref 135–145)
Total Bilirubin: 0.3 mg/dL (ref 0.2–1.2)
Total Protein: 8 g/dL (ref 6.0–8.3)

## 2018-12-13 MED ORDER — MELOXICAM 7.5 MG PO TABS: 7.5000 mg | ORAL_TABLET | Freq: Every day | ORAL | 0 refills | Status: DC

## 2018-12-13 MED ORDER — ATORVASTATIN CALCIUM 40 MG PO TABS: 40.0000 mg | ORAL_TABLET | Freq: Every day | ORAL | 3 refills | Status: DC

## 2018-12-13 MED ORDER — LOSARTAN POTASSIUM 25 MG PO TABS: 25.0000 mg | ORAL_TABLET | Freq: Every day | ORAL | 3 refills | Status: DC

## 2018-12-13 NOTE — Progress Notes (Signed)
Subjective:    Patient ID: Jonathan Harmon, male    DOB: February 18, 1934, 85 y.o.   MRN: [id number]  HPI   Pt in for first time hear with primary care. Former pcp in [place].   Pt describes some chronic shoulder pain, hips  and knee pain. Pt estimates pain for couple of years. Shoulder hurt worse than other joints.  Pt takes tylenol and alleve for pain. Alleve seems to work better. Does not need daily.  Pt has lipitor on his med list. He is not aware that he is on med?  Pt bp is borderline high. Toprol xl on his med list but he is not taking.  Pt is on 2 inhalers. Hx of copd. Pt used to smoke when younger but stopped 20 years. Pt will be seeing Dr. Delton CoombesByrum next Friday.  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    Review of Systems  Constitutional: Negative for chills, fatigue and fever.  Respiratory: Negative for chest tightness, shortness of breath and wheezing.   Cardiovascular: Negative for chest pain and palpitations.  Gastrointestinal: Negative for abdominal pain.  Musculoskeletal: Negative for back pain.  Hematological: Negative for adenopathy. Does not bruise/bleed easily.  Psychiatric/Behavioral: Negative for behavioral problems.    Past Medical History:  Diagnosis Date  . Asbestos exposure    diminished lung compacity from asbestos exposure 30 years  . Pneumonia    x 4     Social History   Socioeconomic History  . Marital status: Widowed    Spouse name: Not on file  . Number of children: Not on file  . Years of education: Not on file  . Highest education level: Not on file  Occupational History  . Occupation: Retired Lobbyistirefighter  Social Needs  . Financial resource strain: Not on file  . Food insecurity    Worry: Not on file    Inability: Not on file  . Transportation needs    Medical: Not on file    Non-medical: Not on file  Tobacco Use  . Smoking status: Former Smoker    Packs/day: 1.00    Years: 40.00    Pack years: 40.00    Types: Cigarettes   Quit date: 04/25/2000    Years since quitting: 18.6  . Smokeless tobacco: Never Used  Substance and Sexual Activity  . Alcohol use: No    Comment: rarely  . Drug use: No  . Sexual activity: Not on file  Lifestyle  . Physical activity    Days per week: Not on file    Minutes per session: Not on file  . Stress: Not on file  Relationships  . Social Musicianconnections    Talks on phone: Not on file    Gets together: Not on file    Attends religious service: Not on file    Active member of club or organization: Not on file    Attends meetings of clubs or organizations: Not on file    Relationship status: Not on file  . Intimate partner violence    Fear of current or ex partner: Not on file    Emotionally abused: Not on file    Physically abused: Not on file    Forced sexual activity: Not on file  Other Topics Concern  . Not on file  Social History Narrative  . Not on file    Past Surgical History:  Procedure Laterality Date  . APPENDECTOMY    . SHOULDER SURGERY Bilateral    x 5 ( 2 on  right ; 3 on left)    Family History  Problem Relation Age of Onset  . Heart attack Father     Allergies  Allergen Reactions  . Penicillins Anaphylaxis  . Penicillin G Swelling    Current Outpatient Medications on File Prior to Visit  Medication Sig Dispense Refill  . albuterol (VENTOLIN HFA) 108 (90 Base) MCG/ACT inhaler Inhale 2 puffs into the lungs every 6 (six) hours as needed for wheezing or shortness of breath. 1 Inhaler 6  . tiotropium (SPIRIVA) 18 MCG inhalation capsule Place 18 mcg into inhaler and inhale daily.    . Tiotropium Bromide-Olodaterol (STIOLTO RESPIMAT) 2.5-2.5 MCG/ACT AERS Inhale 2 puffs into the lungs daily. 4 g 3  . atorvastatin (LIPITOR) 40 MG tablet Take 40 mg by mouth daily.    . cilostazol (PLETAL) 100 MG tablet Take 100 mg by mouth 2 (two) times daily.    . metoprolol succinate (TOPROL-XL) 25 MG 24 hr tablet Take 25 mg by mouth daily.     No current  facility-administered medications on file prior to visit.     BP 140/84   Pulse 91   Temp (!) 97.3 F (36.3 C) (Oral)   Resp 16   Ht 5\' 9"  (1.753 m)   Wt 116 lb (52.6 kg)   SpO2 97%   BMI 17.13 kg/m       Objective:   Physical Exam  General Mental Status- Alert. General Appearance- Not in acute distress.   Skin General: Color- Normal Color. Moisture- Normal Moisture.   Chest and Lung Exam Auscultation: Breath Sounds:-shallow but even respirations.  Cardiovascular Auscultation:Rythm- Regular. Murmurs & Other Heart Sounds:Auscultation of the heart reveals- No Murmurs.  Abdomen Inspection:-Inspeection Normal. Palpation/Percussion:Note:No mass. Palpation and Percussion of the abdomen reveal- Non Tender, Non Distended + BS, no rebound or guarding.    Neurologic Cranial Nerve exam:- CN III-XII intact(No nystagmus), symmetric smile. Strength:- 5/5 equal and symmetric strength both upper and lower extremities.  Lower ext- no pedal edema.      Assessment & Plan:  Nice to meet you today.  You do have borderline elevated bp. Will check cmp today/kidney function then decide which med to rx. Not sure metoprolol which is on historical med list best tx.  For high cholesterol hx with atherosclerosis will get lipid panel. Then decide on dose of statin.  For shoulder pain, get xrays. After lab review can decide on which med would recommend for pain.  For copd hx, follow up Dr. Lamonte Sakai next week.  REcommend flu vaccine in about a month.  Follow up in 2 months or as needed.  Reminder we are doing virtual visit. For those recommend have thermometer, bp cuff and o2 sat monitor.  Mackie Pai, PA-C

## 2018-12-13 NOTE — Patient Instructions (Addendum)
Nice to meet you today.  You do have borderline elevated bp. Will check cmp today/kidney function then decide which med to rx. Not sure metoprolol which is on historical med list best tx.  For high cholesterol hx with atherosclerosis will get lipid panel. Then decide on dose of statin.  For shoulder pain, get xrays. After lab review can decide on which med would recommend for pain.  For copd hx, follow up Dr. Lamonte Sakai next week.  REcommend flu vaccine in about a month.  Follow up in 2 months or as needed.  Reminder we are doing virtual visit. For those recommend have thermometer, bp cuff and o2 sat monitor.

## 2018-12-13 NOTE — Telephone Encounter (Signed)
Rx meloxicam sent to pt pharmacy. 

## 2018-12-13 NOTE — Telephone Encounter (Signed)
Rx atorvastatin and losartan sent to pt pharmacy. 

## 2018-12-14 MED FILL — MELOXICAM 7.5 MG TABLET: 7.5 | 15 days supply | Qty: 15 | Fill #0

## 2018-12-14 MED FILL — ATORVASTATIN 40 MG TABLET: 40 | 30 days supply | Qty: 30 | Fill #0

## 2018-12-14 MED FILL — LOSARTAN POTASSIUM 25 MG TA: 25 | 30 days supply | Qty: 30 | Fill #0

## 2018-12-20 NOTE — Telephone Encounter (Signed)
Pt has an appt with RB on 12/21/2018. Called and made pt aware. Nothing further needed at this time.

## 2018-12-21 ENCOUNTER — Other Ambulatory Visit: Payer: Self-pay

## 2018-12-21 ENCOUNTER — Encounter: Payer: Self-pay | Admitting: Emergency Medicine

## 2018-12-21 ENCOUNTER — Ambulatory Visit (INDEPENDENT_AMBULATORY_CARE_PROVIDER_SITE_OTHER): Payer: Medicare Other | Admitting: Emergency Medicine

## 2018-12-21 DIAGNOSIS — J439 Emphysema, unspecified: Secondary | ICD-10-CM | POA: Diagnosis not present

## 2018-12-21 DIAGNOSIS — Z23 Encounter for immunization: Secondary | ICD-10-CM

## 2018-12-21 DIAGNOSIS — J920 Pleural plaque with presence of asbestos: Secondary | ICD-10-CM

## 2018-12-21 MED ORDER — ALBUTEROL SULFATE HFA 108 (90 BASE) MCG/ACT IN AERS: 2.0000 | INHALATION_SPRAY | Freq: Four times a day (QID) | RESPIRATORY_TRACT | 5 refills | Status: DC | PRN

## 2018-12-21 MED ORDER — STIOLTO RESPIMAT 2.5-2.5 MCG/ACT IN AERS: 2.0000 | INHALATION_SPRAY | Freq: Every day | RESPIRATORY_TRACT | 11 refills | Status: DC

## 2018-12-21 MED FILL — ALBUTEROL SULFATE HFA 108 (: 108 (90 BAS | 25 days supply | Qty: 9 | Fill #0

## 2018-12-21 MED FILL — STIOLTO RESPIMAT INHAL SPRY: 2.5-2.5 | 30 days supply | Qty: 4 | Fill #0

## 2018-12-21 NOTE — Addendum Note (Signed)
Addended by: Desmond Dike C on: 12/21/2018 11:57 AM   Modules accepted: Orders

## 2018-12-21 NOTE — Assessment & Plan Note (Signed)
We will plan to repeat your CT scan of the chest in August 2021 to ensure stability.

## 2018-12-21 NOTE — Patient Instructions (Addendum)
We will plan to repeat your CT scan of the chest in August 2021 to ensure stability. Please stop Spiriva altogether. Please take Stiolto 2 puffs once every day on a schedule.  This is your maintenance medicine.  Take this at the same time and do not skip doses. Keep your albuterol (ProAir) available to use 2 puffs up to every 4 hours if you need it for shortness of breath, chest tightness, wheezing.  You may also want to use 2 puffs about 10 to 15 minutes before you exert yourself, go on your walk. Walking oximetry today on room air. Pneumonia shot is up-to-date. Get the flu shot this fall Follow with Dr Lamonte Sakai in 6 months or sooner if you have any problems

## 2018-12-21 NOTE — Progress Notes (Signed)
Subjective:    Patient ID: Jonathan Harmon, male    DOB: 24-May-1933, 85 y.o.   MRN: [id number]  HPI 85 year old former smoker (40 pack years) with COPD, emphysematous change by imaging.  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  He has been seen in our office by Dr DeDios, most recently by B Mack.  Was treated by phone for an acute exacerbation in April with prednisone and doxycycline.  Saw B Mack in May with increased dyspnea. He was on Spiriva, changed to Anoro and then ultimately to [facility] in May 2020 for increased dyspnea. He has apparently been   He is able to walk over a mile, does this every other day.   Repeat CT scan of his chest was done on 12/12/2018 which I reviewed.  This shows significant pleural involvement with scarring and calcifications including an unchanged elongated 12 mm pulmonary nodule at his right pulmonary apex.  He has some bibasilar fibrotic change with honeycomb formation, no apparent groundglass.  This is slightly increased when compared back to 11/2015  Pulmonary function testing 07/2016 reviewed by me, shows severe obstruction, FEV1 56% predicted, with hyperinflated total lung capacity.   Review of Systems  Past Medical History:  Diagnosis Date  . Asbestos exposure    diminished lung compacity from asbestos exposure 30 years  . COPD (chronic obstructive pulmonary disease) ([place])   . Hyperlipidemia   . Hypertension   . Pneumonia    x 4     Family History  Problem Relation Age of Onset  . Heart attack Father      Social History   Socioeconomic History  . Marital status: Widowed    Spouse name: Not on file  . Number of children: Not on file  . Years of education: Not on file  . Highest education level: Not on file  Occupational History  . Occupation: Retired Scientist, water quality  . Financial resource  strain: Not on file  . Food insecurity    Worry: Not on file    Inability: Not on file  . Transportation needs    Medical: Not on file    Non-medical: Not on file  Tobacco Use  . Smoking status: Former Smoker    Packs/day: 1.00    Years: 40.00    Pack years: 40.00    Types: Cigarettes    Quit date: 04/25/2000    Years since quitting: 18.6  . Smokeless tobacco: Never Used  Substance and Sexual Activity  . Alcohol use: No    Comment: rarely  . Drug use: No  . Sexual activity: Not Currently  Lifestyle  . Physical activity    Days per week: Not on file    Minutes per session: Not on file  . Stress: Not on file  Relationships  . Social Herbalist on phone: Not on file    Gets together: Not on file    Attends religious service: Not on file    Active member of club or organization: Not on file    Attends meetings of clubs or organizations: Not on file    Relationship status: Not on file  . Intimate partner violence    Fear of current or ex partner: Not on file    Emotionally abused: Not on file    Physically abused: Not on file    Forced sexual activity: Not on  file  Other Topics Concern  . Not on file  Social History Narrative  . 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   Allergies  Allergen Reactions  . Penicillins Anaphylaxis  . Penicillin G Swelling     Outpatient Medications Prior to Visit  Medication Sig Dispense Refill  . albuterol (VENTOLIN HFA) 108 (90 Base) MCG/ACT inhaler Inhale 2 puffs into the lungs every 6 (six) hours as needed for wheezing or shortness of breath. 1 Inhaler 6  . atorvastatin (LIPITOR) 40 MG tablet Take 1 tablet (40 mg total) by mouth daily. 30 tablet 3  . losartan (COZAAR) 25 MG tablet Take 1 tablet (25 mg total) by mouth daily. 30 tablet 3  . meloxicam (MOBIC) 7.5 MG tablet Take 1 tablet (7.5 mg total) by mouth daily. 15 tablet 0  . tiotropium (SPIRIVA) 18 MCG inhalation capsule Place 18  mcg into inhaler and inhale daily.    . Tiotropium Bromide-Olodaterol (STIOLTO RESPIMAT) 2.5-2.5 MCG/ACT AERS Inhale 2 puffs into the lungs daily. 4 g 3  . cilostazol (PLETAL) 100 MG tablet Take 100 mg by mouth 2 (two) times daily.     No facility-administered medications prior to visit.         Objective:   Physical Exam Vitals:   12/21/18 1025  BP: 130/86  Pulse: 80  SpO2: 94%  Weight: 115 lb (52.2 kg)  Height: 5\' 9"  (1.753 m)   Gen: Pleasant, thin, in no distress,  normal affect  ENT: No lesions,  mouth clear,  oropharynx clear, no postnasal drip  Neck: No JVD, no stridor  Lungs: No use of accessory muscles, no crackles or wheezing on normal respiration, he does wheeze on forced expiration  Cardiovascular: RRR, heart sounds normal, no murmur or gallops, no peripheral edema  Musculoskeletal: No deformities, no cyanosis or clubbing  Neuro: alert, awake, non focal  Skin: Warm, no lesions or rash      Assessment & Plan:  Calcified pleural plaque due to asbestos exposure We will plan to repeat your CT scan of the chest in August 2021 to ensure stability.   COPD (chronic obstructive pulmonary disease) (HCC) Please stop Spiriva altogether. Please take Stiolto 2 puffs once every day on a schedule.  This is your maintenance medicine.  Take this at the same time and do not skip doses. Keep your albuterol (ProAir) available to use 2 puffs up to every 4 hours if you need it for shortness of breath, chest tightness, wheezing.  You may also want to use 2 puffs about 10 to 15 minutes before you exert yourself, go on your walk. Walking oximetry today on room air. Pneumonia shot is up-to-date. Get the flu shot this fall Follow with Dr Delton CoombesByrum in 6 months or sooner if you have any problems  Levy Pupaobert Latoy Labriola, MD, PhD 12/21/2018, 10:56 AM [facility] [phone] or if no answer [phone]

## 2018-12-21 NOTE — Assessment & Plan Note (Signed)
Please stop Spiriva altogether. Please take Stiolto 2 puffs once every day on a schedule.  This is your maintenance medicine.  Take this at the same time and do not skip doses. Keep your albuterol (ProAir) available to use 2 puffs up to every 4 hours if you need it for shortness of breath, chest tightness, wheezing.  You may also want to use 2 puffs about 10 to 15 minutes before you exert yourself, go on your walk. Walking oximetry today on room air. Pneumonia shot is up-to-date. Get the flu shot this fall Follow with Dr Lamonte Sakai in 6 months or sooner if you have any problems

## 2018-12-26 ENCOUNTER — Other Ambulatory Visit: Payer: Self-pay | Admitting: Medical

## 2018-12-27 MED FILL — MELOXICAM 7.5 MG TABLET: 7.5 | 15 days supply | Qty: 15 | Fill #0

## 2019-01-03 ENCOUNTER — Encounter: Payer: Self-pay | Admitting: Medical

## 2019-01-08 ENCOUNTER — Other Ambulatory Visit: Payer: Self-pay

## 2019-01-09 ENCOUNTER — Telehealth: Payer: Self-pay | Admitting: Medical

## 2019-01-09 ENCOUNTER — Encounter: Payer: Self-pay | Admitting: Medical

## 2019-01-09 ENCOUNTER — Ambulatory Visit (INDEPENDENT_AMBULATORY_CARE_PROVIDER_SITE_OTHER): Payer: Medicare Other | Admitting: Medical

## 2019-01-09 VITALS — BP 130/70 | HR 90 | Temp 97.9°F | Resp 16 | Ht 69.0 in | Wt 117.8 lb

## 2019-01-09 DIAGNOSIS — Z23 Encounter for immunization: Secondary | ICD-10-CM

## 2019-01-09 DIAGNOSIS — H6122 Impacted cerumen, left ear: Secondary | ICD-10-CM

## 2019-01-09 DIAGNOSIS — I10 Essential (primary) hypertension: Secondary | ICD-10-CM

## 2019-01-09 NOTE — Telephone Encounter (Signed)
I keep calling Jonathan Harmon and only get answering machine. I don't want to leave message of apology about flu vaccine but want to talk with him personally and explain that on my last note I had planned to give him flu vaccine today/in about  one month.  I did not realize until later that It appears he got it 8 days later with Dr. Lamonte Sakai.(saw that after he left as you pointed out) He did not mention this to me as I educated him on benefit of vaccine this year with covid.  So please send me message reminder. I want to try to call again later this week or early next week apologize to him directly not leave message.

## 2019-01-09 NOTE — Patient Instructions (Addendum)
Your left side ear wax cleared completely. If get blocked again in future could use debrox otc to soften wax prior to appointment.  BP controlled with losartan. Continue losartan.  Flu vaccine today.  Follow up 4 months or as needed

## 2019-01-09 NOTE — Telephone Encounter (Signed)
Upon check out from visit today with provider, I clarified with Jonathan Harmon if pt needed to keep his appt next mo for a f/u.  Per Jonathan Harmon, pt could cancel that appt and f/u in 4 mos for his BP since it is currently under control.  I relayed this information to the pt and he stated he did not wish to schedule an appt at this time and will contact us on an as needed basis. Provider made aware of this.

## 2019-01-09 NOTE — Progress Notes (Addendum)
   Subjective:    Patient ID: Jonathan Harmon, male    DOB: 07/17/1933, 85 y.o.   MRN: [id number]  HPI  Pt has hx of left ear cerumen impaction. Last wax obstuction about 5 years ago. Just recently his left ear feels blocked and can't hear. He had wax washed out succesfully in the past. He states most of time he would clear wax out himself.  No ear pain. No recent preceding upper respiratory symptoms or allergies.  Review of Systems  Constitutional: Negative for chills, fatigue and fever.  HENT: Negative for congestion and ear pain.        Left ear blocked sensation can't hear.  Respiratory: Negative for cough, chest tightness and shortness of breath.   Cardiovascular: Negative for chest pain and palpitations.  Musculoskeletal: Negative for back pain and myalgias.  Skin: Negative for rash.  Neurological: Negative for dizziness, seizures, weakness and headaches.       Slight dizzy sensation with wax obstruction. But now resolved when wax removed.  Hematological: Negative for adenopathy. Does not bruise/bleed easily.  Psychiatric/Behavioral: Negative for behavioral problems and confusion. The patient is not nervous/anxious.        Objective:   Physical Exam  General  Mental Status - Alert. General Appearance - Well groomed. Not in acute distress.  Skin Rashes- No Rashes.  HEENT Head- Normal. Ear Auditory Canal - Left- wax obstruction prior to lavage. Post lavage cleared.. Right - Normal.Tympanic Membrane- Left- Normal post lavage Right- Normal. Eye Sclera/Conjunctiva- Left- Normal. Right- Normal. Nose & Sinuses Nasal Mucosa- Left-  Boggy and Congested. Right-  Boggy and  Congested.Bilateral maxillary and frontal sinus pressure. Mouth & Throat Lips: Upper Lip- Normal: no dryness, cracking, pallor, cyanosis, or vesicular eruption. Lower Lip-Normal: no dryness, cracking, pallor, cyanosis or vesicular eruption. Buccal Mucosa- Bilateral- No Aphthous ulcers. Oropharynx- No Discharge  or Erythema. Tonsils: Characteristics- Bilateral- No Erythema or Congestion. Size/Enlargement- Bilateral- No enlargement. Discharge- bilateral-None.     Chest and Lung Exam Auscultation: Breath Sounds:-Clear even and unlabored.  Cardiovascular Auscultation:Rythm- Regular, rate and rhythm. Murmurs & Other Heart Sounds:Ausculatation of the heart reveal- No Murmurs.  Lymphatic Head & Neck General Head & Neck Lymphatics: Bilateral: Description- No Localized lymphadenopathy.       Assessment & Plan:  Your left side ear wax cleared completely. If get blocked again in future could use debrox otc to soften wax prior to appointment.  BP controlled with losartan. Continue losartan.  Flu vaccine today.  Follow up 4 months or as needed  25 spent with pt today. 50% of time spent counseling on treatment plans today and plan for future wax blockage. Discussed bp med tx and benefits flu vaccine this year due to covid pandemic.   I had planned to give pt vaccine today. Saw him last month and explained this. So offered and educated on benefit. Pt ok'd vaccine but then later saw during interim he saw pulmonologist and they gave vaccine. I notified pt and apologized for mistake. He was kind and understanding/accepted my apology. I will ask management to not charge for the vaccine. I signed off incorrect on order and thought could put in explanation but did not have opportuinity on order?  Mackie Pai, PA-C

## 2019-01-09 NOTE — Telephone Encounter (Signed)
I called pt and explained/apologized about flu vaccine. He was gracious/forgiving  about mistake.   What do I do about order so he does not get charged?

## 2019-01-09 NOTE — Addendum Note (Signed)
Addended by: Hinton Dyer on: 01/09/2019 01:59 PM   Modules accepted: Orders

## 2019-01-10 ENCOUNTER — Telehealth: Payer: Self-pay | Admitting: Medical

## 2019-01-10 MED ORDER — MELOXICAM 7.5 MG PO TABS: 7.5000 mg | ORAL_TABLET | Freq: Every day | ORAL | 0 refills | Status: DC

## 2019-01-10 MED FILL — ATORVASTATIN 40 MG TABLET: 40 | 30 days supply | Qty: 30 | Fill #1

## 2019-01-10 MED FILL — MELOXICAM 7.5 MG TABLET: 7.5 | 30 days supply | Qty: 30 | Fill #0

## 2019-01-10 MED FILL — LOSARTAN POTASSIUM 25 MG TA: 25 | 30 days supply | Qty: 30 | Fill #1

## 2019-01-10 NOTE — Telephone Encounter (Signed)
Maudie Mercury already took care of it.

## 2019-01-10 NOTE — Telephone Encounter (Signed)
Okay to fill? 

## 2019-01-10 NOTE — Telephone Encounter (Signed)
Rx refill of meloxicam. But will advise and counsel not to overuse due to potential effect on kidney.

## 2019-02-05 MED FILL — traMADol HCL 50 MG TABS: 50 | 7 days supply | Qty: 28 | Fill #0

## 2019-02-11 MED FILL — LOSARTAN POTASSIUM 25 MG TA: 25 | 30 days supply | Qty: 30 | Fill #2

## 2019-02-11 MED FILL — ATORVASTATIN 40 MG TABLET: 40 | 30 days supply | Qty: 30 | Fill #2

## 2019-02-12 ENCOUNTER — Ambulatory Visit: Payer: Medicare Other | Admitting: Medical

## 2019-02-15 NOTE — Telephone Encounter (Signed)
Patient sent email :   "My shoulders have been giving me a great deal of pain over the last year. I had an appt. with Dr. Veverly Fells @ [facility], who after looking at the CT scan suggested that a reverse shoulder surgery would be the needed next step since Cortisone is no longer proving effective. However, while I am willing to undergo the surgery their office needs to hear from you that this procedure is medically cleared.   [facility] did not specify if I needed paperwork filled out. I had the impression that Dr. Lamonte Sakai would contact Dr. Veverly Fells to discuss my situation and any concerns or risks about my fitness or ability to handle the procedure."  Dr. Lamonte Sakai please advise Patient aware RB not in office until 10/30

## 2019-02-25 MED FILL — traMADol HCL 50 MG TABS: 50 | 7 days supply | Qty: 28 | Fill #1

## 2019-02-26 ENCOUNTER — Telehealth: Payer: Self-pay | Admitting: Emergency Medicine

## 2019-02-26 NOTE — Telephone Encounter (Signed)
"  My shoulders have been giving me a great deal of pain over the last year. I had an appt. with Dr. Veverly Fells @ [facility], who after looking at the CT scan suggested that a reverse shoulder surgery would be the needed next step since Cortisone is no longer proving effective. However, while I am willing to undergo the surgery their office needs to hear from you that this procedure is medically cleared.   [facility] did not specify if I needed paperwork filled out. I had the impression that Dr. Lamonte Sakai would contact Dr. Veverly Fells to discuss my situation and any concerns or risks about my fitness or ability to handle the procedure."  Dr. Lamonte Sakai please advise Patient aware RB not in office until 10/30

## 2019-02-26 NOTE — Telephone Encounter (Signed)
Will call DR Veverly Fells, [phone]

## 2019-02-27 NOTE — Telephone Encounter (Signed)
Spoke with PA at their office, reviewed surgery and risks. I have told them that pt is high risk for procedure but that this does not preclude procedure if benefits outweigh the risks.   They may sen me a surgical risk form to complete.

## 2019-02-27 NOTE — Telephone Encounter (Signed)
Called and left a message for Dr Veverly Fells - their office will return call to our office when free.

## 2019-03-05 MED FILL — traMADol HCL 50 MG TABS: 50 | 7 days supply | Qty: 28 | Fill #1

## 2019-03-05 NOTE — Telephone Encounter (Signed)
Late entry - I did speak with Dr Gilberto Better PA. See telephone note.

## 2019-03-11 MED FILL — ATORVASTATIN 40 MG TABLET: 40 | 30 days supply | Qty: 30 | Fill #3

## 2019-03-11 MED FILL — LOSARTAN POTASSIUM 25 MG TA: 25 | 30 days supply | Qty: 30 | Fill #3

## 2019-03-11 MED FILL — STIOLTO RESPIMAT INHAL SPRY: 2.5-2.5 | 30 days supply | Qty: 4 | Fill #1

## 2019-04-09 MED FILL — traMADol HCL 50 MG TABS: 50 | 15 days supply | Qty: 60 | Fill #0

## 2019-04-12 ENCOUNTER — Other Ambulatory Visit: Payer: Self-pay | Admitting: Medical

## 2019-04-12 MED FILL — LOSARTAN POTASSIUM 25 MG TA: 25 | 30 days supply | Qty: 30 | Fill #0

## 2019-04-12 MED FILL — ATORVASTATIN 40 MG TABLET: 40 | 30 days supply | Qty: 30 | Fill #0

## 2019-04-12 NOTE — Telephone Encounter (Signed)
Last OV 01/09/19 Last refill(s) 12/13/18 #30/3 Next OV 05/13/19

## 2019-05-13 ENCOUNTER — Other Ambulatory Visit: Payer: Self-pay

## 2019-05-13 ENCOUNTER — Ambulatory Visit (INDEPENDENT_AMBULATORY_CARE_PROVIDER_SITE_OTHER): Payer: Medicare Other | Admitting: Medical

## 2019-05-13 VITALS — BP 132/80 | HR 63 | Temp 97.5°F | Resp 16 | Wt 114.0 lb

## 2019-05-13 DIAGNOSIS — E785 Hyperlipidemia, unspecified: Secondary | ICD-10-CM

## 2019-05-13 DIAGNOSIS — J439 Emphysema, unspecified: Secondary | ICD-10-CM | POA: Diagnosis not present

## 2019-05-13 DIAGNOSIS — I10 Essential (primary) hypertension: Secondary | ICD-10-CM | POA: Diagnosis not present

## 2019-05-13 NOTE — Progress Notes (Signed)
Subjective:    Patient ID: Jonathan Harmon, male    DOB: Oct 18, 1933, 86 y.o.   MRN: [id number]  HPI  Pt has high blood pressure. His bp is well controlled today. No cardiac or neurologic signs or symptoms.  Up to date on flu vaccine.  Pt  Has copd. Sees Dr. Lamonte Sakai in the past. He is on stiolto and doing well. Follow up appointment should be in February on review of last note.  Pt has number of covid vaccine clinic.   Review of Systems  Constitutional: Negative for chills, fatigue and fever.  Respiratory: Negative for chest tightness, shortness of breath and wheezing.   Cardiovascular: Negative for chest pain and palpitations.  Gastrointestinal: Negative for abdominal pain.  Genitourinary: Negative for dysuria, frequency, testicular pain and urgency.  Musculoskeletal: Negative for back pain, myalgias and neck stiffness.  Skin: Negative for rash.  Neurological: Negative for dizziness, weakness and headaches.  Hematological: Negative for adenopathy. Does not bruise/bleed easily.  Psychiatric/Behavioral: Negative for behavioral problems, decreased concentration and sleep disturbance.    Past Medical History:  Diagnosis Date  . Asbestos exposure    diminished lung compacity from asbestos exposure 30 years  . COPD (chronic obstructive pulmonary disease) ([place])   . Hyperlipidemia   . Hypertension   . Pneumonia    x 4     Social History   Socioeconomic History  . Marital status: Widowed    Spouse name: Not on file  . Number of children: Not on file  . Years of education: Not on file  . Highest education level: Not on file  Occupational History  . Occupation: Retired Airline pilot  Tobacco Use  . Smoking status: Former Smoker    Packs/day: 1.00    Years: 40.00    Pack years: 40.00    Types: Cigarettes    Quit date: 04/25/2000    Years since quitting: 19.0  . Smokeless tobacco: Never Used  Substance and Sexual Activity  . Alcohol use: No    Comment: rarely  . Drug use: No    . Sexual activity: Not Currently  Other Topics Concern  . Not on file  Social History Narrative  . Not on file   Social Determinants of Health   Financial Resource Strain:   . Difficulty of Paying Living Expenses: Not on file  Food Insecurity:   . Worried About Charity fundraiser in the Last Year: Not on file  . Ran Out of Food in the Last Year: Not on file  Transportation Needs:   . Lack of Transportation (Medical): Not on file  . Lack of Transportation (Non-Medical): Not on file  Physical Activity:   . Days of Exercise per Week: Not on file  . Minutes of Exercise per Session: Not on file  Stress:   . Feeling of Stress : Not on file  Social Connections:   . Frequency of Communication with Friends and Family: Not on file  . Frequency of Social Gatherings with Friends and Family: Not on file  . Attends Religious Services: Not on file  . Active Member of Clubs or Organizations: Not on file  . Attends Archivist Meetings: Not on file  . Marital Status: Not on file  Intimate Partner Violence:   . Fear of Current or Ex-Partner: Not on file  . Emotionally Abused: Not on file  . Physically Abused: Not on file  . Sexually Abused: Not on file    Past Surgical History:  Procedure Laterality  Date  . APPENDECTOMY    . SHOULDER SURGERY Bilateral    x 5 ( 2 on right ; 3 on left)    Family History  Problem Relation Age of Onset  . Heart attack Father     Allergies  Allergen Reactions  . Penicillins Anaphylaxis  . Penicillin G Swelling    Current Outpatient Medications on File Prior to Visit  Medication Sig Dispense Refill  . albuterol (VENTOLIN HFA) 108 (90 Base) MCG/ACT inhaler Inhale 2 puffs into the lungs every 6 (six) hours as needed for wheezing or shortness of breath. 18 g 5  . atorvastatin (LIPITOR) 40 MG tablet TAKE 1 TABLET BY MOUTH ONCE DAILY 30 tablet 3  . losartan (COZAAR) 25 MG tablet TAKE 1 TABLET BY MOUTH ONCE DAILY 30 tablet 3  . tiotropium  (SPIRIVA) 18 MCG inhalation capsule Place 18 mcg into inhaler and inhale daily.    . Tiotropium Bromide-Olodaterol (STIOLTO RESPIMAT) 2.5-2.5 MCG/ACT AERS Inhale 2 puffs into the lungs daily. 4 g 11   No current facility-administered medications on file prior to visit.    BP 132/80 (BP Location: Left Arm, Patient Position: Sitting, Cuff Size: Normal)   Pulse 63   Temp (!) 97.5 F (36.4 C) (Oral)   Resp 16   Wt 114 lb (51.7 kg)   SpO2 94%   BMI 16.83 kg/m       Objective:   Physical Exam  General Mental Status- Alert. General Appearance- Not in acute distress.   Skin General: Color- Normal Color. Moisture- Normal Moisture.  Neck Carotid Arteries- Normal color. Moisture- Normal Moisture. No carotid bruits. No JVD.  Chest and Lung Exam Auscultation: Breath Sounds:-Normal.  Cardiovascular Auscultation:Rythm- Regular. Murmurs & Other Heart Sounds:Auscultation of the heart reveals- No Murmurs.  Abdomen Inspection:-Inspeection Normal. Palpation/Percussion:Note:No mass. Palpation and Percussion of the abdomen reveal- Non Tender, Non Distended + BS, no rebound or guarding.   Neurologic Cranial Nerve exam:- CN III-XII intact(No nystagmus), symmetric smile. Strength:- 5/5 equal and symmetric strength both upper and lower extremities.  Lower ext- no pedal edema.      Assessment & Plan:  Your bp is well controlled today. Continue current medication.   For emphysema, continue with current inhaler and follow up with pulmonologist.  Discussed benefit vs risk of covid vacccine. In older age groups benefits out weight risk.   Future labs placed cmp and lipid panel fasting for early summer.  Follow up early summer or as needed  20 minutes spent with pt. 50% of time spent counseling on plan going forward.  Esperanza Richters, PA-C

## 2019-05-13 NOTE — Patient Instructions (Addendum)
Your bp is well controlled today. Continue current medication.   For emphysema, continue with current inhaler and follow up with pulmonologist.  Discussed benefit vs risk of covid vacccine. In older age groups benefits out weight risk.    Future labs placed cmp and lipid panel fasting for early summer.  Follow up early summer or as needed

## 2019-05-17 MED FILL — LOSARTAN POTASSIUM 25 MG TA: 25 | 30 days supply | Qty: 30 | Fill #1

## 2019-05-17 MED FILL — ATORVASTATIN 40 MG TABLET: 40 | 30 days supply | Qty: 30 | Fill #1

## 2019-06-25 MED FILL — ATORVASTATIN 40 MG TABLET: 40 | 30 days supply | Qty: 30 | Fill #2

## 2019-06-25 MED FILL — LOSARTAN POTASSIUM 25 MG TA: 25 | 30 days supply | Qty: 30 | Fill #2

## 2019-07-29 MED FILL — LOSARTAN POTASSIUM 25 MG TA: 25 | 30 days supply | Qty: 30 | Fill #3

## 2019-07-29 MED FILL — ATORVASTATIN 40 MG TABLET: 40 | 30 days supply | Qty: 30 | Fill #3

## 2019-09-03 ENCOUNTER — Other Ambulatory Visit: Payer: Self-pay | Admitting: Medical

## 2019-09-30 ENCOUNTER — Other Ambulatory Visit (INDEPENDENT_AMBULATORY_CARE_PROVIDER_SITE_OTHER): Payer: Medicare Other

## 2019-09-30 ENCOUNTER — Other Ambulatory Visit: Payer: Self-pay

## 2019-09-30 DIAGNOSIS — E785 Hyperlipidemia, unspecified: Secondary | ICD-10-CM | POA: Diagnosis not present

## 2019-09-30 DIAGNOSIS — I10 Essential (primary) hypertension: Secondary | ICD-10-CM | POA: Diagnosis not present

## 2019-09-30 LAB — LIPID PANEL
Cholesterol: 117 mg/dL (ref 0–200)
HDL: 46.8 mg/dL (ref >39.00)
LDL Cholesterol: 61 mg/dL (ref 0–99)
NonHDL: 70.04
Total CHOL/HDL Ratio: 2
Triglycerides: 45 mg/dL (ref 0.0–149.0)
VLDL: 9 mg/dL (ref 0.0–40.0)

## 2019-09-30 LAB — COMPREHENSIVE METABOLIC PANEL
ALT: 11 U/L (ref 0–53)
AST: 20 U/L (ref 0–37)
Albumin: 3.8 g/dL (ref 3.5–5.2)
Alkaline Phosphatase: 99 U/L (ref 39–117)
BUN: 24 mg/dL — ABNORMAL HIGH (ref 6–23)
CO2: 31 mEq/L (ref 19–32)
Calcium: 9.1 mg/dL (ref 8.4–10.5)
Chloride: 105 mEq/L (ref 96–112)
Creatinine, Ser: 1.18 mg/dL (ref 0.40–1.50)
GFR: 58.85 mL/min — ABNORMAL LOW (ref >60.00)
Glucose, Bld: 95 mg/dL (ref 70–99)
Potassium: 4.4 mEq/L (ref 3.5–5.1)
Sodium: 140 mEq/L (ref 135–145)
Total Bilirubin: 0.4 mg/dL (ref 0.2–1.2)
Total Protein: 6.9 g/dL (ref 6.0–8.3)

## 2019-10-07 MED FILL — LOSARTAN POTASSIUM 25 MG TA: 25 | 30 days supply | Qty: 30 | Fill #1

## 2019-10-07 MED FILL — ATORVASTATIN CALCIUM 40 MG: 40 | 30 days supply | Qty: 30 | Fill #1

## 2019-10-22 ENCOUNTER — Other Ambulatory Visit: Payer: Self-pay

## 2019-10-22 ENCOUNTER — Ambulatory Visit (INDEPENDENT_AMBULATORY_CARE_PROVIDER_SITE_OTHER): Payer: Medicare Other | Admitting: Medical

## 2019-10-22 VITALS — BP 118/77 | HR 80 | Resp 18 | Ht 69.0 in | Wt 116.0 lb

## 2019-10-22 DIAGNOSIS — M79641 Pain in right hand: Secondary | ICD-10-CM

## 2019-10-22 DIAGNOSIS — M79642 Pain in left hand: Secondary | ICD-10-CM | POA: Diagnosis not present

## 2019-10-22 NOTE — Patient Instructions (Signed)
For bilateral hand pain with appearance of osteoarthritis. Will get sports medicine opinion to evaluate non surgical option. Not sure what ortho had proposed. Can try combination of low dose tylenol and ibuprofen 200-400 mg every 8 hours as needed.  Also referred to PT to evaluate for potential adaptive equipment.  Follow up in 3 weeks or as needed

## 2019-10-22 NOTE — Progress Notes (Signed)
Subjective:    Patient ID: Jonathan Harmon, male    DOB: November 04, 1933, 86 y.o.   MRN: [id number]  HPI  Pt in with some left hand pain. Some rt hand pain as well. Left hand pain aw well. Pain when he flexes. Pain throughout the day. Pain present for 4 years. Last year son states he is complaining of pain more when he attempts to eat and grabs utensils.  Pt [facility] 2 weeks ago. Had injections in hands that last only 1-2 weeks at most.  Ortho mentioned possible surgery to give more room for tendons.  Pt describes mostly knuckle pain.  Pt has tried tramadol, mobic, diclofenac. None worked. When using hands has 5-6/10.  Dinner time is time when most painful.    Review of Systems  Constitutional: Negative for chills, fatigue and fever.  Respiratory: Negative for cough, chest tightness, shortness of breath and wheezing.   Cardiovascular: Negative for chest pain and palpitations.  Genitourinary: Negative for difficulty urinating and flank pain.  Musculoskeletal: Negative for back pain.       Hand pain see hpi.  Neurological: Negative for dizziness, seizures, speech difficulty, weakness and headaches.  Hematological: Negative for adenopathy. Does not bruise/bleed easily.  Psychiatric/Behavioral: Negative for behavioral problems and confusion.    Past Medical History:  Diagnosis Date  . Asbestos exposure    diminished lung compacity from asbestos exposure 30 years  . COPD (chronic obstructive pulmonary disease) (HCC)   . Hyperlipidemia   . Hypertension   . Pneumonia    x 4     Social History   Socioeconomic History  . Marital status: Widowed    Spouse name: Not on file  . Number of children: Not on file  . Years of education: Not on file  . Highest education level: Not on file  Occupational History  . Occupation: Retired IT sales professional  Tobacco Use  . Smoking status: Former Smoker    Packs/day: 1.00    Years: 40.00    Pack years: 40.00    Types: Cigarettes    Quit  date: 04/25/2000    Years since quitting: 19.5  . Smokeless tobacco: Never Used  Substance and Sexual Activity  . Alcohol use: No    Comment: rarely  . Drug use: No  . Sexual activity: Not Currently  Other Topics Concern  . Not on file  Social History Narrative  . Not on file   Social Determinants of Health   Financial Resource Strain:   . Difficulty of Paying Living Expenses:   Food Insecurity:   . Worried About Programme researcher, broadcasting/film/video in the Last Year:   . Barista in the Last Year:   Transportation Needs:   . Freight forwarder (Medical):   Marland Kitchen Lack of Transportation (Non-Medical):   Physical Activity:   . Days of Exercise per Week:   . Minutes of Exercise per Session:   Stress:   . Feeling of Stress :   Social Connections:   . Frequency of Communication with Friends and Family:   . Frequency of Social Gatherings with Friends and Family:   . Attends Religious Services:   . Active Member of Clubs or Organizations:   . Attends Banker Meetings:   Marland Kitchen Marital Status:   Intimate Partner Violence:   . Fear of Current or Ex-Partner:   . Emotionally Abused:   Marland Kitchen Physically Abused:   . Sexually Abused:     Past Surgical History:  Procedure Laterality Date  . APPENDECTOMY    . SHOULDER SURGERY Bilateral    x 5 ( 2 on right ; 3 on left)    Family History  Problem Relation Age of Onset  . Heart attack Father     Allergies  Allergen Reactions  . Penicillins Anaphylaxis  . Penicillin G Swelling    Current Outpatient Medications on File Prior to Visit  Medication Sig Dispense Refill  . albuterol (VENTOLIN HFA) 108 (90 Base) MCG/ACT inhaler Inhale 2 puffs into the lungs every 6 (six) hours as needed for wheezing or shortness of breath. 18 g 5  . atorvastatin (LIPITOR) 40 MG tablet TAKE 1 TABLET BY MOUTH ONCE DAILY 30 tablet 3  . losartan (COZAAR) 25 MG tablet TAKE 1 TABLET BY MOUTH ONCE DAILY 30 tablet 3  . tiotropium (SPIRIVA) 18 MCG inhalation  capsule Place 18 mcg into inhaler and inhale daily.    . Tiotropium Bromide-Olodaterol (STIOLTO RESPIMAT) 2.5-2.5 MCG/ACT AERS Inhale 2 puffs into the lungs daily. 4 g 11   No current facility-administered medications on file prior to visit.    BP 118/77   Pulse 80   Resp 18   Ht 5\' 9"  (1.753 m)   Wt 116 lb (52.6 kg)   SpO2 92%   BMI 17.13 kg/m       Objective:   Physical Exam  General- No acute distress. Pleasant patient. Neck- Full range of motion, no jvd Lungs- Clear, even and unlabored. Heart- regular rate and rhythm. Neurologic- CNII- XII grossly intact.  Bilateral hands- osteoarthritic  appearance. On inepection       Assessment & Plan:  For bilateral hand pain with appearance of osteoarthritis. Will get sports medicine opinion to evaluate non surgical option. Not sure what ortho had proposed. Can try combination of low dose tylenol and ibuprofen 200-400 mg every 8 hours as needed.  Also referred to PT to evaluate for potential adaptive equipment.  Follow up in 3 weeks or as needed  , PA-C   Time spent with patient today was  25 minutes which consisted of chart review, discussing diagnosis, referrals, treatment and documentation.

## 2019-10-23 ENCOUNTER — Telehealth: Payer: Self-pay | Admitting: Medical

## 2019-10-23 NOTE — Progress Notes (Signed)
°  Chronic Care Management   Outreach Note  10/23/2019 Name: Jonathan Harmon MRN: [id number] DOB: 08/12/1933  Referred by: Esperanza Richters, PA-C Reason for referral : No chief complaint on file.   An unsuccessful telephone outreach was attempted today. The patient was referred to the pharmacist for assistance with care management and care coordination.   Follow Up Plan:   Alvie He[place] [employer] Scheduler

## 2019-10-29 ENCOUNTER — Ambulatory Visit (INDEPENDENT_AMBULATORY_CARE_PROVIDER_SITE_OTHER): Payer: Medicare Other | Admitting: Family Medicine

## 2019-10-29 ENCOUNTER — Encounter: Payer: Self-pay | Admitting: Family Medicine

## 2019-10-29 ENCOUNTER — Other Ambulatory Visit: Payer: Self-pay

## 2019-10-29 VITALS — BP 153/90 | HR 65 | Ht 69.0 in | Wt 116.0 lb

## 2019-10-29 DIAGNOSIS — M79641 Pain in right hand: Secondary | ICD-10-CM | POA: Diagnosis present

## 2019-10-29 DIAGNOSIS — M25542 Pain in joints of left hand: Secondary | ICD-10-CM | POA: Insufficient documentation

## 2019-10-29 DIAGNOSIS — M79642 Pain in left hand: Secondary | ICD-10-CM | POA: Diagnosis not present

## 2019-10-29 NOTE — Progress Notes (Signed)
Medication Samples have been provided to the patient.  Drug name: Pennsaid       Strength: 2%        Qty: 2 boxes  LOT: [id number]  Exp.Date: 05/2020  Dosing instructions: use a pea size amount and rub gently.  The patient has been instructed regarding the correct time, dose, and frequency of taking this medication, including desired effects and most common side effects.   Kathi Simpers, [place] 3:54 PM 10/29/2019

## 2019-10-29 NOTE — Assessment & Plan Note (Signed)
Seems to be degenerative in nature. Less likely for gout or other form of autoimmune.  - counseled on supportive care - provided pennsaid sample  - could consider lab work or lose dose prednisone daily

## 2019-10-29 NOTE — Patient Instructions (Signed)
Nice to meet you Please try the pennsaid. A peasize to apply  Please try heat  Please try making the grips to things bigger   Please send me a message in MyChart with any questions or updates.  Please see me back in 4 weeks.   --Dr. Jordan Likes

## 2019-10-29 NOTE — Progress Notes (Signed)
Jonathan Harmon - 86 y.o. male MRN [id number]  Date of birth: Feb 21, 1934  SUBJECTIVE:  Including CC & ROS.  Chief Complaint  Patient presents with  . Hand Pain    bilateral     Jonathan Harmon is a 86 y.o. male that is presenting with bilateral hand pain.  The pain is occurring in the PIP and DIP joints of his hands.  Having swelling of his digits as well.  Seems of gotten worse for the past 2 years.  Has tried several over-the-counter medications.  Pain is worse with gripping.   Review of Systems See HPI   HISTORY: Past Medical, Surgical, Social, and Family History Reviewed & Updated per EMR.   Pertinent Historical Findings include:  Past Medical History:  Diagnosis Date  . Asbestos exposure    diminished lung compacity from asbestos exposure 30 years  . COPD (chronic obstructive pulmonary disease) (HCC)   . Hyperlipidemia   . Hypertension   . Pneumonia    x 4    Past Surgical History:  Procedure Laterality Date  . APPENDECTOMY    . SHOULDER SURGERY Bilateral    x 5 ( 2 on right ; 3 on left)    Family History  Problem Relation Age of Onset  . Heart attack Father     Social History   Socioeconomic History  . Marital status: Widowed    Spouse name: Not on file  . Number of children: Not on file  . Years of education: Not on file  . Highest education level: Not on file  Occupational History  . Occupation: Retired IT sales professional  Tobacco Use  . Smoking status: Former Smoker    Packs/day: 1.00    Years: 40.00    Pack years: 40.00    Types: Cigarettes    Quit date: 04/25/2000    Years since quitting: 19.5  . Smokeless tobacco: Never Used  Substance and Sexual Activity  . Alcohol use: No    Comment: rarely  . Drug use: No  . Sexual activity: Not Currently  Other Topics Concern  . Not on file  Social History Narrative  . Not on file   Social Determinants of Health   Financial Resource Strain:   . Difficulty of Paying Living Expenses:   Food Insecurity:   .  Worried About Programme researcher, broadcasting/film/video in the Last Year:   . Barista in the Last Year:   Transportation Needs:   . Freight forwarder (Medical):   Marland Kitchen Lack of Transportation (Non-Medical):   Physical Activity:   . Days of Exercise per Week:   . Minutes of Exercise per Session:   Stress:   . Feeling of Stress :   Social Connections:   . Frequency of Communication with Friends and Family:   . Frequency of Social Gatherings with Friends and Family:   . Attends Religious Services:   . Active Member of Clubs or Organizations:   . Attends Banker Meetings:   Marland Kitchen Marital Status:   Intimate Partner Violence:   . Fear of Current or Ex-Partner:   . Emotionally Abused:   Marland Kitchen Physically Abused:   . Sexually Abused:      PHYSICAL EXAM:  VS: BP (!) 153/90   Pulse 65   Ht 5\' 9"  (1.753 m)   Wt 116 lb (52.6 kg)   BMI 17.13 kg/m  Physical Exam Gen: NAD, alert, cooperative with exam, well-appearing MSK:  Right and left hand: Swelling of his  digits as well as deformities of the DIP and PIP of several fingers.   No redness. Some limitation with flexion. Neurovascularly intact     ASSESSMENT & PLAN:   Bilateral hand pain Seems to be degenerative in nature. Less likely for gout or other form of autoimmune.  - counseled on supportive care - provided pennsaid sample  - could consider lab work or lose dose prednisone daily

## 2019-11-12 ENCOUNTER — Ambulatory Visit: Payer: Medicare Other | Admitting: Physical Therapy

## 2019-11-13 MED FILL — LOSARTAN POTASSIUM 25 MG TA: 25 | 30 days supply | Qty: 30 | Fill #2

## 2019-11-13 MED FILL — ATORVASTATIN CALCIUM 40 MG: 40 | 30 days supply | Qty: 30 | Fill #2

## 2019-11-26 ENCOUNTER — Other Ambulatory Visit: Payer: Self-pay

## 2019-11-26 ENCOUNTER — Ambulatory Visit (INDEPENDENT_AMBULATORY_CARE_PROVIDER_SITE_OTHER): Payer: Medicare Other | Admitting: Family Medicine

## 2019-11-26 ENCOUNTER — Encounter: Payer: Self-pay | Admitting: Family Medicine

## 2019-11-26 DIAGNOSIS — M79641 Pain in right hand: Secondary | ICD-10-CM | POA: Diagnosis present

## 2019-11-26 DIAGNOSIS — M79642 Pain in left hand: Secondary | ICD-10-CM | POA: Diagnosis not present

## 2019-11-26 NOTE — Patient Instructions (Signed)
Good to see you Please try the Rayos. Please take as close to 10 pm as you can  Please try the pennsaid. I can send in voltaren if you run out of this.  Please try ice and tylenol   Please send me a message in MyChart with any questions or updates.  Please see me back in 4 weeks. .   --Dr. Jordan Likes

## 2019-11-26 NOTE — Assessment & Plan Note (Signed)
Pain is degenerative in nature.  Limited improvement with topical thus far. -Counseled supportive care. -Provided Rayos sample. -Could consider rheum work up.

## 2019-11-26 NOTE — Progress Notes (Signed)
Jonathan Harmon - 86 y.o. male MRN [id number]  Date of birth: [birth year]  SUBJECTIVE:  Including CC & ROS.  Chief Complaint  Patient presents with  . Follow-up    bilateral hand    Jonathan Harmon is a 86 y.o. male that is presenting with bilateral hand pain.  Pain localizes the pain to the PIP and DIP joints of his fingers.  No swell from time to time.  He has trouble making a fist.   Review of Systems See HPI   HISTORY: Past Medical, Surgical, Social, and Family History Reviewed & Updated per EMR.   Pertinent Historical Findings include:  Past Medical History:  Diagnosis Date  . Asbestos exposure    diminished lung compacity from asbestos exposure 30 years  . COPD (chronic obstructive pulmonary disease) (HCC)   . Hyperlipidemia   . Hypertension   . Pneumonia    x 4    Past Surgical History:  Procedure Laterality Date  . APPENDECTOMY    . SHOULDER SURGERY Bilateral    x 5 ( 2 on right ; 3 on left)    Family History  Problem Relation Age of Onset  . Heart attack Father     Social History   Socioeconomic History  . Marital status: Widowed    Spouse name: Not on file  . Number of children: Not on file  . Years of education: Not on file  . Highest education level: Not on file  Occupational History  . Occupation: Retired IT sales professional  Tobacco Use  . Smoking status: Former Smoker    Packs/day: 1.00    Years: 40.00    Pack years: 40.00    Types: Cigarettes    Quit date: 04/25/2000    Years since quitting: 19.6  . Smokeless tobacco: Never Used  Substance and Sexual Activity  . Alcohol use: No    Comment: rarely  . Drug use: No  . Sexual activity: Not Currently  Other Topics Concern  . Not on file  Social History Narrative  . Not on file   Social Determinants of Health   Financial Resource Strain:   . Difficulty of Paying Living Expenses:   Food Insecurity:   . Worried About Programme researcher, broadcasting/film/video in the Last Year:   . Barista in the Last Year:     Transportation Needs:   . Freight forwarder (Medical):   Marland Kitchen Lack of Transportation (Non-Medical):   Physical Activity:   . Days of Exercise per Week:   . Minutes of Exercise per Session:   Stress:   . Feeling of Stress :   Social Connections:   . Frequency of Communication with Friends and Family:   . Frequency of Social Gatherings with Friends and Family:   . Attends Religious Services:   . Active Member of Clubs or Organizations:   . Attends Banker Meetings:   Marland Kitchen Marital Status:   Intimate Partner Violence:   . Fear of Current or Ex-Partner:   . Emotionally Abused:   Marland Kitchen Physically Abused:   . Sexually Abused:      PHYSICAL EXAM:  VS: Ht 5\' 9"  (1.753 m)   BMI 17.13 kg/m  Physical Exam Gen: NAD, alert, cooperative with exam, well-appearing MSK:  Right and left hand: Obvious deformities of the PIP and DIP joints of each hand. Has trouble making a fist. Weak grip strength. Neurovascular intact     ASSESSMENT & PLAN:   Bilateral hand pain Pain is  degenerative in nature.  Limited improvement with topical thus far. -Counseled supportive care. -Provided Rayos sample. -Could consider rheum work up.

## 2019-11-26 NOTE — Progress Notes (Signed)
Medication Samples have been provided to the patient.  Drug name: Rayos       Strength: 5mg         Qty: 1 box  LOT: E  Exp.Date: 02/2020  Dosing instructions: take 1 tablet by mouth as close to 10 pm as possible.  The patient has been instructed regarding the correct time, dose, and frequency of taking this medication, including desired effects and most common side effects.   03/2020, Kathi Simpers 12:39 PM 11/26/2019

## 2019-12-03 ENCOUNTER — Telehealth: Payer: Self-pay | Admitting: Medical

## 2019-12-03 NOTE — Progress Notes (Signed)
  Chronic Care Management   Outreach Note  12/03/2019 Name: Creedence Kunesh MRN: [id number] DOB: 1934-02-07  Referred by: Esperanza Richters, PA-C Reason for referral : No chief complaint on file.   A second unsuccessful telephone outreach was attempted today. The patient was referred to pharmacist for assistance with care management and care coordination.  Follow Up Plan:   Carley Perdue [employer] Scheduler

## 2019-12-12 ENCOUNTER — Telehealth: Payer: Self-pay | Admitting: Medical

## 2019-12-12 NOTE — Progress Notes (Signed)
  Chronic Care Management   Outreach Note  12/12/2019 Name: Ronnell Clinger MRN: [id number] DOB: April 12, 1934  Referred by: Esperanza Richters, PA-C Reason for referral : No chief complaint on file.   Third unsuccessful telephone outreach was attempted today. The patient was referred to the pharmacist for assistance with care management and care coordination.   Follow Up Plan:   Carley Perdue [employer] Scheduler

## 2019-12-18 MED FILL — STIOLTO RESPIMAT INHAL SPRY: 2.5-2.5 | 30 days supply | Qty: 4 | Fill #1

## 2019-12-27 ENCOUNTER — Ambulatory Visit (INDEPENDENT_AMBULATORY_CARE_PROVIDER_SITE_OTHER): Payer: Medicare Other | Admitting: Family Medicine

## 2019-12-27 ENCOUNTER — Other Ambulatory Visit: Payer: Self-pay

## 2019-12-27 VITALS — BP 120/82 | Ht 69.0 in | Wt 140.0 lb

## 2019-12-27 DIAGNOSIS — M25542 Pain in joints of left hand: Secondary | ICD-10-CM | POA: Diagnosis not present

## 2019-12-27 DIAGNOSIS — M25541 Pain in joints of right hand: Secondary | ICD-10-CM

## 2019-12-27 NOTE — Patient Instructions (Signed)
Good to see you Please take one tablet of rayos at night around 10 pm. Do this until they run out.  I will call with the results from today   Please send me a message in MyChart with any questions or updates.  Please see me back in 2-3 months.   --Dr. Jordan Likes

## 2019-12-27 NOTE — Progress Notes (Signed)
Medication Samples have been provided to the patient.  Drug name: rayos       Strength: 5mg         Qty: 1 box  LOT: e  Exp.Date: 11/21 Dosing instructions: take one tab at 10pm every night until gone  The patient has been instructed regarding the correct time, dose, and frequency of taking this medication, including desired effects and most common side effects.   12/21 11:50 AM 12/27/2019

## 2019-12-27 NOTE — Progress Notes (Signed)
Jonathan Harmon - 86 y.o. male MRN [id number]  Date of birth: [birth year]  SUBJECTIVE:  Including CC & ROS.  No chief complaint on file.   Jonathan Harmon is a 86 y.o. male that is presenting with ongoing bilateral hand pain.  He is having trouble closing his left hand.  He has degenerative changes in multiple joints of his hand.   Review of Systems See HPI   HISTORY: Past Medical, Surgical, Social, and Family History Reviewed & Updated per EMR.   Pertinent Historical Findings include:  Past Medical History:  Diagnosis Date  . Asbestos exposure    diminished lung compacity from asbestos exposure 30 years  . COPD (chronic obstructive pulmonary disease) (HCC)   . Hyperlipidemia   . Hypertension   . Pneumonia    x 4    Past Surgical History:  Procedure Laterality Date  . APPENDECTOMY    . SHOULDER SURGERY Bilateral    x 5 ( 2 on right ; 3 on left)    Family History  Problem Relation Age of Onset  . Heart attack Father     Social History   Socioeconomic History  . Marital status: Widowed    Spouse name: Not on file  . Number of children: Not on file  . Years of education: Not on file  . Highest education level: Not on file  Occupational History  . Occupation: Retired IT sales professional  Tobacco Use  . Smoking status: Former Smoker    Packs/day: 1.00    Years: 40.00    Pack years: 40.00    Types: Cigarettes    Quit date: 04/25/2000    Years since quitting: 19.6  . Smokeless tobacco: Never Used  Substance and Sexual Activity  . Alcohol use: No    Comment: rarely  . Drug use: No  . Sexual activity: Not Currently  Other Topics Concern  . Not on file  Social History Narrative  . Not on file   Social Determinants of Health   Financial Resource Strain:   . Difficulty of Paying Living Expenses: Not on file  Food Insecurity:   . Worried About Programme researcher, broadcasting/film/video in the Last Year: Not on file  . Ran Out of Food in the Last Year: Not on file  Transportation Needs:   . Lack  of Transportation (Medical): Not on file  . Lack of Transportation (Non-Medical): Not on file  Physical Activity:   . Days of Exercise per Week: Not on file  . Minutes of Exercise per Session: Not on file  Stress:   . Feeling of Stress : Not on file  Social Connections:   . Frequency of Communication with Friends and Family: Not on file  . Frequency of Social Gatherings with Friends and Family: Not on file  . Attends Religious Services: Not on file  . Active Member of Clubs or Organizations: Not on file  . Attends Banker Meetings: Not on file  . Marital Status: Not on file  Intimate Partner Violence:   . Fear of Current or Ex-Partner: Not on file  . Emotionally Abused: Not on file  . Physically Abused: Not on file  . Sexually Abused: Not on file     PHYSICAL EXAM:  VS: BP 120/82   Ht 5\' 9"  (1.753 m)   Wt 140 lb (63.5 kg)   BMI 20.67 kg/m  Physical Exam Gen: NAD, alert, cooperative with exam, well-appearing MSK:  Left hand: Unable to make a complete fist. Some swelling  associated with the MCP joints and the PIP joints. Neurovascular intact     ASSESSMENT & PLAN:   Arthralgia of both hands Symptoms seem to be worsening.  Denies any redness but does have trouble making a fist. -Counseled supportive care. -Provided Rayos sample. -Uric acid, sed rate, CRP, ANA panel.

## 2019-12-29 NOTE — Assessment & Plan Note (Signed)
Symptoms seem to be worsening.  Denies any redness but does have trouble making a fist. -Counseled supportive care. -Provided Rayos sample. -Uric acid, sed rate, CRP, ANA panel.

## 2020-01-23 MED FILL — LOSARTAN POTASSIUM 25 MG TA: 25 | 30 days supply | Qty: 30 | Fill #2

## 2020-01-23 MED FILL — ATORVASTATIN CALCIUM 40 MG: 40 | 30 days supply | Qty: 30 | Fill #2

## 2020-02-18 ENCOUNTER — Other Ambulatory Visit (HOSPITAL_BASED_OUTPATIENT_CLINIC_OR_DEPARTMENT_OTHER): Payer: Self-pay | Admitting: Podiatry

## 2020-02-18 MED FILL — METHYLPREDNISOLONE 4 MG TBP: 4 | 6 days supply | Qty: 21 | Fill #0

## 2020-02-26 ENCOUNTER — Ambulatory Visit: Payer: Medicare Other | Admitting: Family Medicine

## 2020-02-26 NOTE — Progress Notes (Deleted)
°  Jonathan Harmon - 86 y.o. male MRN [id number]  Date of birth: 1934-01-27  SUBJECTIVE:  Including CC & ROS.  No chief complaint on file.   Jonathan Harmon is a 86 y.o. male that is  ***.  ***   Review of Systems See HPI   HISTORY: Past Medical, Surgical, Social, and Family History Reviewed & Updated per EMR.   Pertinent Historical Findings include:  Past Medical History:  Diagnosis Date   Asbestos exposure    diminished lung compacity from asbestos exposure 30 years   COPD (chronic obstructive pulmonary disease) (HCC)    Hyperlipidemia    Hypertension    Pneumonia    x 4    Past Surgical History:  Procedure Laterality Date   APPENDECTOMY     SHOULDER SURGERY Bilateral    x 5 ( 2 on right ; 3 on left)    Family History  Problem Relation Age of Onset   Heart attack Father     Social History   Socioeconomic History   Marital status: Widowed    Spouse name: Not on file   Number of children: Not on file   Years of education: Not on file   Highest education level: Not on file  Occupational History   Occupation: Retired IT sales professional  Tobacco Use   Smoking status: Former Smoker    Packs/day: 1.00    Years: 40.00    Pack years: 40.00    Types: Cigarettes    Quit date: 04/25/2000    Years since quitting: 19.8   Smokeless tobacco: Never Used  Substance and Sexual Activity   Alcohol use: No    Comment: rarely   Drug use: No   Sexual activity: Not Currently  Other Topics Concern   Not on file  Social History Narrative   Not on file   Social Determinants of Health   Financial Resource Strain:    Difficulty of Paying Living Expenses: Not on file  Food Insecurity:    Worried About Programme researcher, broadcasting/film/video in the Last Year: Not on file   [employer] of Food in the Last Year: Not on file  Transportation Needs:    Lack of Transportation (Medical): Not on file   Lack of Transportation (Non-Medical): Not on file  Physical Activity:    Days of Exercise  per Week: Not on file   Minutes of Exercise per Session: Not on file  Stress:    Feeling of Stress : Not on file  Social Connections:    Frequency of Communication with Friends and Family: Not on file   Frequency of Social Gatherings with Friends and Family: Not on file   Attends Religious Services: Not on file   Active Member of Clubs or Organizations: Not on file   Attends Banker Meetings: Not on file   Marital Status: Not on file  Intimate Partner Violence:    Fear of Current or Ex-Partner: Not on file   Emotionally Abused: Not on file   Physically Abused: Not on file   Sexually Abused: Not on file     PHYSICAL EXAM:  VS: There were no vitals taken for this visit. Physical Exam Gen: NAD, alert, cooperative with exam, well-appearing MSK:  ***      ASSESSMENT & PLAN:   No problem-specific Assessment & Plan notes found for this encounter.

## 2020-03-26 MED FILL — ATORVASTATIN CALCIUM 40 MG: 40 | 30 days supply | Qty: 30 | Fill #3

## 2020-03-26 MED FILL — LOSARTAN POTASSIUM 25 MG TA: 25 | 30 days supply | Qty: 30 | Fill #3

## 2020-04-27 ENCOUNTER — Other Ambulatory Visit: Payer: Self-pay | Admitting: Emergency Medicine

## 2020-04-29 ENCOUNTER — Other Ambulatory Visit: Payer: Self-pay | Admitting: Emergency Medicine

## 2020-04-29 ENCOUNTER — Telehealth: Payer: Self-pay | Admitting: Emergency Medicine

## 2020-04-29 MED ORDER — STIOLTO RESPIMAT 2.5-2.5 MCG/ACT IN AERS: 2.0000 | INHALATION_SPRAY | Freq: Every day | RESPIRATORY_TRACT | 11 refills | Status: DC

## 2020-04-29 MED FILL — STIOLTO RESPIMAT INHAL SPRY: 2.5-2.5 | 30 days supply | Qty: 4 | Fill #0

## 2020-04-29 NOTE — Telephone Encounter (Signed)
Called and spoke with son to verify medication that needs to be refilled and for him to verify pharmacy. He verified both. Refill has been sent it. Nothing further needed at this time.

## 2020-05-22 ENCOUNTER — Ambulatory Visit: Payer: Medicare Other | Admitting: Emergency Medicine

## 2020-05-27 ENCOUNTER — Other Ambulatory Visit: Payer: Self-pay

## 2020-05-27 ENCOUNTER — Encounter: Payer: Self-pay | Admitting: Adult Health

## 2020-05-27 ENCOUNTER — Other Ambulatory Visit: Payer: Self-pay | Admitting: Adult Health

## 2020-05-27 ENCOUNTER — Ambulatory Visit (INDEPENDENT_AMBULATORY_CARE_PROVIDER_SITE_OTHER): Payer: Medicare Other | Admitting: Adult Health

## 2020-05-27 DIAGNOSIS — R911 Solitary pulmonary nodule: Secondary | ICD-10-CM

## 2020-05-27 DIAGNOSIS — J920 Pleural plaque with presence of asbestos: Secondary | ICD-10-CM

## 2020-05-27 DIAGNOSIS — J449 Chronic obstructive pulmonary disease, unspecified: Secondary | ICD-10-CM

## 2020-05-27 DIAGNOSIS — E46 Unspecified protein-calorie malnutrition: Secondary | ICD-10-CM | POA: Insufficient documentation

## 2020-05-27 DIAGNOSIS — E440 Moderate protein-calorie malnutrition: Secondary | ICD-10-CM | POA: Diagnosis not present

## 2020-05-27 MED ORDER — ALBUTEROL SULFATE HFA 108 (90 BASE) MCG/ACT IN AERS: 2.0000 | INHALATION_SPRAY | Freq: Four times a day (QID) | RESPIRATORY_TRACT | 6 refills | Status: DC | PRN

## 2020-05-27 MED ORDER — STIOLTO RESPIMAT 2.5-2.5 MCG/ACT IN AERS: 2.0000 | INHALATION_SPRAY | Freq: Every day | RESPIRATORY_TRACT | 6 refills | Status: DC

## 2020-05-27 MED FILL — ALBUTEROL SULFATE HFA 108 (: 108 (90 BAS | 25 days supply | Qty: 9 | Fill #0

## 2020-05-27 MED FILL — STIOLTO RESPIMAT INHAL SPRY: 2.5-2.5 | 30 days supply | Qty: 4 | Fill #0

## 2020-05-27 NOTE — Assessment & Plan Note (Signed)
Pleural plaques secondary to asbestos exposure. Clinically stable.  Check CT chest.

## 2020-05-27 NOTE — Addendum Note (Signed)
Addended by: Charlott Holler on: 05/27/2020 12:21 PM   Modules accepted: Orders

## 2020-05-27 NOTE — Assessment & Plan Note (Signed)
Encouraged on high-protein diet 

## 2020-05-27 NOTE — Progress Notes (Signed)
@Patient  ID: , male    DOB: 1933/07/10, 87 y.o.   MRN: [id number]  Chief Complaint  Patient presents with  . Follow-up    Referring provider: [id number]  HPI: 87 year old male former smoker followed for COPD with emphysema. Has occupational exposure with asbestos as a 89 with widespread pleural plaquing, bibasilar interstitial lung disease with a UIP appearance.  TEST/EVENTS :  Pulmonary function testing 07/2016 reviewed by me, shows severe obstruction, FEV1 56% predicted, with hyperinflated total lung capacity.  05/27/2020 Follow up : COPD with emphysema, asbestos with pleural plaques Patient returns for a follow-up visit. Last seen August 2020. Patient has underlying COPD with emphysema. Patient is maintained on Stiolto. Patient says it is helping well. Feels that it is working well for his breathing. He denies any flare of cough or wheezing. Does get short of breath with heavy activity.  The patient has calcified pleural plaques due to asbestos exposure. Patient was a former September 2020. Patient is due for a follow-up CT scan. Last CT August 2020 showed an unchanged elongated irregular 12 mm pulmonary nodule in the right pulmonary apex. Numerous large thick calcified pleural plaques. And dependent fibrotic changes including bronchiectasis and in the lung bases.  Covid vaccine and booster are up-to-date   Allergies  Allergen Reactions  . Penicillins Anaphylaxis  . Penicillin G Swelling    Immunization History  Administered Date(s) Administered  . Fluad Quad(high Dose 65+) 12/21/2018, 01/09/2019  . Influenza Split 01/20/2015, 01/28/2016  . Influenza, Seasonal, Injecte, Preservative Fre 03/02/2017, 01/23/2018  . Influenza-Unspecified 01/20/2015  . Pneumococcal Conjugate-13 07/20/2016  . Pneumococcal Polysaccharide-23 12/08/2011    Past Medical History:  Diagnosis Date  . Asbestos exposure    diminished lung compacity from asbestos exposure 30  years  . COPD (chronic obstructive pulmonary disease) (HCC)   . Hyperlipidemia   . Hypertension   . Pneumonia    x 4    Tobacco History: Social History   Tobacco Use  Smoking Status Former Smoker  . Packs/day: 1.00  . Years: 40.00  . Pack years: 40.00  . Types: Cigarettes  . Quit date: 04/25/2000  . Years since quitting: 20.1  Smokeless Tobacco Never Used   Counseling given: Not Answered   Outpatient Medications Prior to Visit  Medication Sig Dispense Refill  . albuterol (VENTOLIN HFA) 108 (90 Base) MCG/ACT inhaler Inhale 2 puffs into the lungs every 6 (six) hours as needed for wheezing or shortness of breath. 18 g 5  . atorvastatin (LIPITOR) 40 MG tablet TAKE 1 TABLET BY MOUTH ONCE DAILY 30 tablet 3  . losartan (COZAAR) 25 MG tablet TAKE 1 TABLET BY MOUTH ONCE DAILY 30 tablet 3  . Tiotropium Bromide-Olodaterol (STIOLTO RESPIMAT) 2.5-2.5 MCG/ACT AERS Inhale 2 puffs into the lungs daily. 4 g 11  . tiotropium (SPIRIVA) 18 MCG inhalation capsule Place 18 mcg into inhaler and inhale daily. (Patient not taking: Reported on 05/27/2020)     No facility-administered medications prior to visit.     Review of Systems:   Constitutional:   No  weight loss, night sweats,  Fevers, chills, + fatigue, or  lassitude.  HEENT:   No headaches,  Difficulty swallowing,  Tooth/dental problems, or  Sore throat,                No sneezing, itching, ear ache, nasal congestion, post nasal drip,   CV:  No chest pain,  Orthopnea, PND, swelling in lower extremities, anasarca, dizziness, palpitations, syncope.   GI  No heartburn, indigestion, abdominal pain, nausea, vomiting, diarrhea, change in bowel habits, loss of appetite, bloody stools.   Resp: No shortness of breath with exertion or at rest.  No excess mucus, no productive cough,  No non-productive cough,  No coughing up of blood.  No change in color of mucus.  No wheezing.  No chest wall deformity  Skin: no rash or lesions.  GU: no dysuria,  change in color of urine, no urgency or frequency.  No flank pain, no hematuria   MS:  No joint pain or swelling.  No decreased range of motion.  No back pain.    Physical Exam  BP 132/70 (BP Location: Left Arm, Cuff Size: Normal)   Pulse (!) 54   Temp 97.8 F (36.6 C) (Temporal)   Ht 5\' 7"  (1.702 m)   Wt 114 lb 3.2 oz (51.8 kg)   SpO2 96%   BMI 17.89 kg/m   GEN: A/Ox3; pleasant , NAD, thin , elderly    HEENT:  [place]/AT,   , NOSE-clear, THROAT-clear, no lesions, no postnasal drip or exudate noted.   NECK:  Supple w/ fair ROM; no JVD; normal carotid impulses w/o bruits; no thyromegaly or nodules palpated; no lymphadenopathy.    RESP  Clear  P & A; w/o, wheezes/ rales/ or rhonchi. no accessory muscle use, no dullness to percussion  CARD:  RRR, no m/r/g, no peripheral edema, pulses intact, no cyanosis or clubbing.  GI:   Soft & nt; nml bowel sounds; no organomegaly or masses detected.   Musco: Warm bil, no deformities or joint swelling noted.   Neuro: alert, no focal deficits noted.    Skin: Warm, no lesions or rashes    Lab Results:   BMET  BNP No results found for: BNP  ProBNP No results found for: PROBNP  Imaging: No results found.    No flowsheet data found.  No results found for: NITRICOXIDE      Assessment & Plan:   COPD (chronic obstructive pulmonary disease) (HCC) Compensated on present regimen   Plan  Patient Instructions  Continue on Stiolto 2 puffs daily, rinse after use Activity as tolerated Albuterol as needed High protein diet .  CT chest without contrast to follow lung nodule and pleural plaques Follow-up with Dr. in 6 months and as needed      Calcified pleural plaque due to asbestos exposure Pleural plaques secondary to asbestos exposure. Clinically stable.  Check CT chest.  Protein calorie malnutrition (HCC) Encouraged on high-protein diet     Delton Coombes, NP 05/27/2020

## 2020-05-27 NOTE — Assessment & Plan Note (Signed)
Compensated on present regimen   Plan  Patient Instructions  Continue on Stiolto 2 puffs daily, rinse after use Activity as tolerated Albuterol as needed High protein diet .  CT chest without contrast to follow lung nodule and pleural plaques Follow-up with Dr. Delton Coombes in 6 months and as needed

## 2020-05-27 NOTE — Patient Instructions (Addendum)
Continue on Stiolto 2 puffs daily, rinse after use Activity as tolerated Albuterol as needed High protein diet .  CT chest without contrast to follow lung nodule and pleural plaques Follow-up with Dr. Delton Coombes in 6 months and as needed

## 2020-06-01 ENCOUNTER — Other Ambulatory Visit: Payer: Self-pay | Admitting: Medical

## 2020-06-01 ENCOUNTER — Telehealth: Payer: Self-pay | Admitting: Medical

## 2020-06-01 ENCOUNTER — Ambulatory Visit (INDEPENDENT_AMBULATORY_CARE_PROVIDER_SITE_OTHER): Payer: Medicare Other | Admitting: Medical

## 2020-06-01 ENCOUNTER — Ambulatory Visit (HOSPITAL_BASED_OUTPATIENT_CLINIC_OR_DEPARTMENT_OTHER)
Admission: RE | Admit: 2020-06-01 | Discharge: 2020-06-01 | Disposition: A | Discharge status: Home / Self Care | Payer: Medicare Other | Source: Ambulatory Visit | Attending: Adult Health | Admitting: Adult Health

## 2020-06-01 ENCOUNTER — Other Ambulatory Visit: Payer: Self-pay

## 2020-06-01 VITALS — BP 136/98 | HR 104 | Ht 67.0 in | Wt 121.0 lb

## 2020-06-01 DIAGNOSIS — L0291 Cutaneous abscess, unspecified: Secondary | ICD-10-CM | POA: Diagnosis not present

## 2020-06-01 DIAGNOSIS — R911 Solitary pulmonary nodule: Secondary | ICD-10-CM | POA: Diagnosis not present

## 2020-06-01 DIAGNOSIS — L089 Local infection of the skin and subcutaneous tissue, unspecified: Secondary | ICD-10-CM | POA: Diagnosis not present

## 2020-06-01 LAB — CBC WITH DIFFERENTIAL/PLATELET
Absolute Monocytes: 742 cells/uL (ref 200–950)
Basophils Absolute: 56 cells/uL (ref 0–200)
Basophils Relative: 0.4 %
Eosinophils Absolute: 84 cells/uL (ref 15–500)
Eosinophils Relative: 0.6 %
HCT: 39.6 % (ref 38.5–50.0)
Hemoglobin: 13.1 g/dL — ABNORMAL LOW (ref 13.2–17.1)
Lymphs Abs: 1078 cells/uL (ref 850–3900)
MCH: 31.5 pg (ref 27.0–33.0)
MCHC: 33.1 g/dL (ref 32.0–36.0)
MCV: 95.2 fL (ref 80.0–100.0)
MPV: 8.7 fL (ref 7.5–12.5)
Monocytes Relative: 5.3 %
Neutro Abs: 12040 cells/uL — ABNORMAL HIGH (ref 1500–7800)
Neutrophils Relative %: 86 %
Platelets: 303 10*3/uL (ref 140–400)
RBC: 4.16 10*6/uL — ABNORMAL LOW (ref 4.20–5.80)
RDW: 11.8 % (ref 11.0–15.0)
Total Lymphocyte: 7.7 %
WBC: 14 10*3/uL — ABNORMAL HIGH (ref 3.8–10.8)

## 2020-06-01 MED ORDER — DOXYCYCLINE HYCLATE 100 MG PO TABS: 100.0000 mg | ORAL_TABLET | Freq: Two times a day (BID) | ORAL | 0 refills | Status: DC

## 2020-06-01 MED FILL — DOXYCYCLINE HYCLATE 100 MG: 100 | 10 days supply | Qty: 20 | Fill #0

## 2020-06-01 NOTE — Patient Instructions (Signed)
You have area of skin infection with concern for developing abscess vs infected sebaceous cyst directly over mid upper lumbar spine area. Due to location and size do want to get you in with general surgeon to evaluate and treat.   Will get cbc today. Have you start doxycycline oral antibiotic. Also can apply warm salt water compress twice daily.    Hopefully will give you update on referral by tomorrow morning.  Follow up in 7 days or as needed

## 2020-06-01 NOTE — Telephone Encounter (Signed)
Will you see referral to [place] [facility]. Can pt get in by tomorrow.

## 2020-06-01 NOTE — Progress Notes (Signed)
Subjective:    Patient ID: Jonathan Harmon, male    DOB: 11/07/1933, 87 y.o.   MRN: [id number]  HPI  Pt in for some low back pain. He states has raised area mid upper upper L spine area swollen area. Present for about 5 days. If he does not apply pressure has no pain. Level of pain is about 7-8/10 when area is palpated.  Pt had some cysts on back on and off. But not over spine.    Review of Systems  Constitutional: Negative for chills, fatigue and fever.  Respiratory: Negative for cough, chest tightness, shortness of breath and wheezing.   Cardiovascular: Negative for chest pain and palpitations.  Gastrointestinal: Negative for abdominal pain.  Musculoskeletal: Negative for back pain, myalgias and neck pain.  Skin: Negative for rash.       See hpi.  Neurological: Negative for dizziness, seizures, weakness and headaches.  Hematological: Negative for adenopathy. Does not bruise/bleed easily.  Psychiatric/Behavioral: Negative for behavioral problems and decreased concentration.    Past Medical History:  Diagnosis Date  . Asbestos exposure    diminished lung compacity from asbestos exposure 30 years  . COPD (chronic obstructive pulmonary disease) (HCC)   . Hyperlipidemia   . Hypertension   . Pneumonia    x 4     Social History   Socioeconomic History  . Marital status: Widowed    Spouse name: Not on file  . Number of children: Not on file  . Years of education: Not on file  . Highest education level: Not on file  Occupational History  . Occupation: Retired IT sales professional  Tobacco Use  . Smoking status: Former Smoker    Packs/day: 1.00    Years: 40.00    Pack years: 40.00    Types: Cigarettes    Quit date: 04/25/2000    Years since quitting: 20.1  . Smokeless tobacco: Never Used  Substance and Sexual Activity  . Alcohol use: No    Comment: rarely  . Drug use: No  . Sexual activity: Not Currently  Other Topics Concern  . Not on file  Social History Narrative  . Not  on file   Social Determinants of Health   Financial Resource Strain: Not on file  Food Insecurity: Not on file  Transportation Needs: Not on file  Physical Activity: Not on file  Stress: Not on file  Social Connections: Not on file  Intimate Partner Violence: Not on file    Past Surgical History:  Procedure Laterality Date  . APPENDECTOMY    . SHOULDER SURGERY Bilateral    x 5 ( 2 on right ; 3 on left)    Family History  Problem Relation Age of Onset  . Heart attack Father     Allergies  Allergen Reactions  . Penicillins Anaphylaxis  . Penicillin G Swelling    Current Outpatient Medications on File Prior to Visit  Medication Sig Dispense Refill  . albuterol (VENTOLIN HFA) 108 (90 Base) MCG/ACT inhaler Inhale 2 puffs into the lungs every 6 (six) hours as needed for wheezing or shortness of breath. 18 g 6  . atorvastatin (LIPITOR) 40 MG tablet TAKE 1 TABLET BY MOUTH ONCE DAILY 30 tablet 3  . losartan (COZAAR) 25 MG tablet TAKE 1 TABLET BY MOUTH ONCE DAILY 30 tablet 3  . Tiotropium Bromide-Olodaterol (STIOLTO RESPIMAT) 2.5-2.5 MCG/ACT AERS Inhale 2 puffs into the lungs daily. 4 g 6  . tiotropium (SPIRIVA) 18 MCG inhalation capsule Place 18 mcg into inhaler  and inhale daily. (Patient not taking: No sig reported)     No current facility-administered medications on file prior to visit.    BP (!) 136/98   Pulse (!) 104   Ht 5\' 7"  (1.702 m)   Wt 121 lb (54.9 kg)   SpO2 94%   BMI 18.95 kg/m      Objective:   Physical Exam  General- No acute distress. Pleasant patient. Neck- Full range of motion, no jvd Lungs- Clear, even and unlabored. Heart- regular rate and rhythm. Neurologic- CNII- XII grossly intact.  Back-  2.5 cm x 2.5 cm. Raised area. Red, irregular appearance to skin. Indurated skin. Center to fluctuant presently.      Assessment & Plan:  You have area of skin infection with concern for developing abscess vs infected sebaceous cyst directly over mid  upper lumbar spine area. Due to location and size do want to get you in with general surgeon to evaluate and treat.   Will get cbc today. Have you start doxycycline oral antibiotic. Also can apply warm salt water compress twice daily.    Hopefully will give you update on referral by tomorrow morning.  Follow up in 7 days or as needed  , [employer]

## 2020-06-01 NOTE — Addendum Note (Signed)
Addended by: Mervin Kung A on: 06/01/2020 03:51 PM   Modules accepted: Orders

## 2020-06-03 NOTE — Telephone Encounter (Signed)
Referral was sent Jonathan Harmon was able to get patient in 06/03/20

## 2020-06-03 NOTE — Telephone Encounter (Signed)
Thanks for update

## 2020-06-04 ENCOUNTER — Telehealth: Payer: Self-pay | Admitting: Adult Health

## 2020-06-04 NOTE — Telephone Encounter (Signed)
Called spoke with Son Jonathan Harmon Jonathan Harmon's results   Jonathan Sicks, NP  06/04/2020 8:43 AM EST      Stable extensive pleural plaques and severe emphysema. Mild pulmonary fibrosis appears to be stable since August 2020 most consistent with fibrotic pulmonary asbestosis. Slightly worsened since 2017. Decreased size of the nodular opacity in the right upper lung. (Consistent with previous infection/inflammation)  Incidental finding of atherosclerosis-recommend to discuss with primary care provider  Continue with office visit recommendations and follow-up   Son voiced understanding. Nothing further needed

## 2020-06-04 NOTE — Progress Notes (Signed)
ATC x1.  Left VM to return call regarding CXR results.

## 2020-06-05 NOTE — Progress Notes (Signed)
Spoke with Edmon Crape, patient's son, listed on the DPR, he states he spoke with someone about the results and had no questions.  Nothing further needed.

## 2020-06-08 ENCOUNTER — Ambulatory Visit (INDEPENDENT_AMBULATORY_CARE_PROVIDER_SITE_OTHER): Payer: Medicare Other | Admitting: Medical

## 2020-06-08 ENCOUNTER — Encounter: Payer: Self-pay | Admitting: Medical

## 2020-06-08 ENCOUNTER — Other Ambulatory Visit: Payer: Self-pay

## 2020-06-08 VITALS — BP 130/70 | HR 83 | Temp 98.2°F | Resp 16 | Ht 67.0 in | Wt 116.2 lb

## 2020-06-08 DIAGNOSIS — L0291 Cutaneous abscess, unspecified: Secondary | ICD-10-CM | POA: Diagnosis not present

## 2020-06-08 DIAGNOSIS — I10 Essential (primary) hypertension: Secondary | ICD-10-CM | POA: Diagnosis not present

## 2020-06-08 DIAGNOSIS — E785 Hyperlipidemia, unspecified: Secondary | ICD-10-CM

## 2020-06-08 LAB — COMPREHENSIVE METABOLIC PANEL
ALT: 17 U/L (ref 0–53)
AST: 22 U/L (ref 0–37)
Albumin: 3.7 g/dL (ref 3.5–5.2)
Alkaline Phosphatase: 112 U/L (ref 39–117)
BUN: 22 mg/dL (ref 6–23)
CO2: 28 mEq/L (ref 19–32)
Calcium: 9.4 mg/dL (ref 8.4–10.5)
Chloride: 99 mEq/L (ref 96–112)
Creatinine, Ser: 1.15 mg/dL (ref 0.40–1.50)
GFR: 58.49 mL/min — ABNORMAL LOW (ref >60.00)
Glucose, Bld: 91 mg/dL (ref 70–99)
Potassium: 4.3 mEq/L (ref 3.5–5.1)
Sodium: 136 mEq/L (ref 135–145)
Total Bilirubin: 0.5 mg/dL (ref 0.2–1.2)
Total Protein: 7.7 g/dL (ref 6.0–8.3)

## 2020-06-08 LAB — LIPID PANEL
Cholesterol: 159 mg/dL (ref 0–200)
HDL: 51.9 mg/dL (ref >39.00)
LDL Cholesterol: 93 mg/dL (ref 0–99)
NonHDL: 106.73
Total CHOL/HDL Ratio: 3
Triglycerides: 68 mg/dL (ref 0.0–149.0)
VLDL: 13.6 mg/dL (ref 0.0–40.0)

## 2020-06-08 NOTE — Progress Notes (Signed)
Subjective:    Patient ID: Jonathan Harmon, male    DOB: 09/05/1933, 87 y.o.   MRN: [id number]  HPI   Pt in for follow up.   Pt had mid lumbar region abscess. Pt had I and D. I had given doxycycline 100 mg twice daily to use pending the I and D which took place on Thursday past week. The area was draining a little bit on daily gauze changes.  Pt had not fever or chills.  The packing came out yesterday when dressing was changes.  See prior note.  Pt has follow up appt on June 25, 2020.    Review of Systems  Constitutional: Negative for chills, fatigue and fever.  HENT: Negative for congestion and drooling.   Respiratory: Negative for cough, chest tightness, shortness of breath and wheezing.   Cardiovascular: Negative for chest pain and palpitations.  Gastrointestinal: Negative for abdominal pain.  Genitourinary: Positive for enuresis.  Musculoskeletal: Negative for back pain.  Neurological: Negative for dizziness, numbness and headaches.  Hematological: Negative for adenopathy. Does not bruise/bleed easily.  Psychiatric/Behavioral: Negative for behavioral problems.    Past Medical History:  Diagnosis Date  . Asbestos exposure    diminished lung compacity from asbestos exposure 30 years  . COPD (chronic obstructive pulmonary disease) (HCC)   . Hyperlipidemia   . Hypertension   . Pneumonia    x 4     Social History   Socioeconomic History  . Marital status: Widowed    Spouse name: Not on file  . Number of children: Not on file  . Years of education: Not on file  . Highest education level: Not on file  Occupational History  . Occupation: Retired IT sales professional  Tobacco Use  . Smoking status: Former Smoker    Packs/day: 1.00    Years: 40.00    Pack years: 40.00    Types: Cigarettes    Quit date: 04/25/2000    Years since quitting: 20.1  . Smokeless tobacco: Never Used  Substance and Sexual Activity  . Alcohol use: No    Comment: rarely  . Drug use: No  . Sexual  activity: Not Currently  Other Topics Concern  . Not on file  Social History Narrative  . Not on file   Social Determinants of Health   Financial Resource Strain: Not on file  Food Insecurity: Not on file  Transportation Needs: Not on file  Physical Activity: Not on file  Stress: Not on file  Social Connections: Not on file  Intimate Partner Violence: Not on file    Past Surgical History:  Procedure Laterality Date  . APPENDECTOMY    . SHOULDER SURGERY Bilateral    x 5 ( 2 on right ; 3 on left)    Family History  Problem Relation Age of Onset  . Heart attack Father     Allergies  Allergen Reactions  . Penicillins Anaphylaxis  . Penicillin G Swelling    Current Outpatient Medications on File Prior to Visit  Medication Sig Dispense Refill  . albuterol (VENTOLIN HFA) 108 (90 Base) MCG/ACT inhaler Inhale 2 puffs into the lungs every 6 (six) hours as needed for wheezing or shortness of breath. 18 g 6  . atorvastatin (LIPITOR) 40 MG tablet TAKE 1 TABLET BY MOUTH ONCE DAILY 30 tablet 3  . losartan (COZAAR) 25 MG tablet TAKE 1 TABLET BY MOUTH ONCE DAILY 30 tablet 3  . Tiotropium Bromide-Olodaterol (STIOLTO RESPIMAT) 2.5-2.5 MCG/ACT AERS Inhale 2 puffs into the lungs  daily. 4 g 6  . doxycycline (VIBRA-TABS) 100 MG tablet Take 1 tablet (100 mg total) by mouth 2 (two) times daily. Can give caps or generic. (Patient not taking: Reported on 06/08/2020) 20 tablet 0  . tiotropium (SPIRIVA) 18 MCG inhalation capsule Place 18 mcg into inhaler and inhale daily. (Patient not taking: No sig reported)     No current facility-administered medications on file prior to visit.    BP (!) 154/92   Pulse 83   Temp 98.2 F (36.8 C) (Oral)   Resp 16   Ht 5\' 7"  (1.702 m)   Wt 116 lb 3.2 oz (52.7 kg)   SpO2 97%   BMI 18.20 kg/m       Objective:   Physical Exam  General- No acute distress. Pleasant patient. Neck- Full range of motion, no jvd Lungs- Clear, even and unlabored. Heart-  regular rate and rhythm. Neurologic- CNII- XII grossly intact.  Back- lower lumbar area shows flattened area of prior abscess. Incision area clean.  Mild pink around incision site. Mild bruising around edge.       Assessment & Plan:  Recent I and D for abscess. Area has some features of skin infection by coloration. Continue doxycycline antibiotic. Did get wound culture today and will follow. Expect color to gradual normalize but if change or worsens notify . Keep appointment with surgeon for follow up.  Htn controlled. bp better on recheck. Continue losartan.  Hx of very  ldl elevation in past.  Continue atorvastatin. Recheck cmp and lipid panel today.  Follow up  6 month for routine check up. Sooner if issues with abscess/skin infected area.

## 2020-06-08 NOTE — Patient Instructions (Addendum)
Recent I and D for abscess. Area has some features of skin infection by coloration. Continue doxycycline antibiotic. Did get wound culture today and will follow. Expect color to gradual normalize but if change or worsens notify us. Keep appointment with surgeon for follow up.  Htn controlled. bp better on recheck. Continue losartan.  Hx of very  ldl elevation in past.  Continue atorvastatin. Recheck cmp and lipid panel today.  Follow up  6 month for routine check up. Sooner if issues with abscess/skin infected area.

## 2020-06-11 LAB — WOUND CULTURE
MICRO NUMBER:: 11530473
RESULT:: NO GROWTH
SPECIMEN QUALITY:: ADEQUATE

## 2020-08-10 ENCOUNTER — Emergency Department (HOSPITAL_BASED_OUTPATIENT_CLINIC_OR_DEPARTMENT_OTHER): Payer: Medicare Other

## 2020-08-10 ENCOUNTER — Other Ambulatory Visit: Payer: Self-pay

## 2020-08-10 ENCOUNTER — Encounter (HOSPITAL_BASED_OUTPATIENT_CLINIC_OR_DEPARTMENT_OTHER): Payer: Self-pay | Admitting: *Deleted

## 2020-08-10 ENCOUNTER — Inpatient Hospital Stay (HOSPITAL_BASED_OUTPATIENT_CLINIC_OR_DEPARTMENT_OTHER)
Admission: EM | Admit: 2020-08-10 | Discharge: 2020-08-12 | DRG: 193 | Disposition: A | Discharge status: Home / Self Care | Payer: Medicare Other | Attending: Internal Medicine | Admitting: Internal Medicine

## 2020-08-10 DIAGNOSIS — R509 Fever, unspecified: Secondary | ICD-10-CM | POA: Diagnosis not present

## 2020-08-10 DIAGNOSIS — J449 Chronic obstructive pulmonary disease, unspecified: Secondary | ICD-10-CM | POA: Diagnosis present

## 2020-08-10 DIAGNOSIS — J189 Pneumonia, unspecified organism: Secondary | ICD-10-CM | POA: Diagnosis not present

## 2020-08-10 DIAGNOSIS — Z880 Allergy status to penicillin: Secondary | ICD-10-CM

## 2020-08-10 DIAGNOSIS — I10 Essential (primary) hypertension: Secondary | ICD-10-CM | POA: Diagnosis present

## 2020-08-10 DIAGNOSIS — J9601 Acute respiratory failure with hypoxia: Secondary | ICD-10-CM | POA: Diagnosis present

## 2020-08-10 DIAGNOSIS — E860 Dehydration: Secondary | ICD-10-CM | POA: Diagnosis present

## 2020-08-10 DIAGNOSIS — Z20822 Contact with and (suspected) exposure to covid-19: Secondary | ICD-10-CM | POA: Diagnosis present

## 2020-08-10 DIAGNOSIS — Z87891 Personal history of nicotine dependence: Secondary | ICD-10-CM

## 2020-08-10 DIAGNOSIS — J920 Pleural plaque with presence of asbestos: Secondary | ICD-10-CM

## 2020-08-10 DIAGNOSIS — J9611 Chronic respiratory failure with hypoxia: Secondary | ICD-10-CM | POA: Diagnosis present

## 2020-08-10 DIAGNOSIS — J440 Chronic obstructive pulmonary disease with acute lower respiratory infection: Secondary | ICD-10-CM | POA: Diagnosis present

## 2020-08-10 DIAGNOSIS — Z77090 Contact with and (suspected) exposure to asbestos: Secondary | ICD-10-CM | POA: Diagnosis present

## 2020-08-10 DIAGNOSIS — J61 Pneumoconiosis due to asbestos and other mineral fibers: Secondary | ICD-10-CM | POA: Diagnosis present

## 2020-08-10 DIAGNOSIS — A419 Sepsis, unspecified organism: Secondary | ICD-10-CM

## 2020-08-10 DIAGNOSIS — G929 Unspecified toxic encephalopathy: Secondary | ICD-10-CM | POA: Diagnosis present

## 2020-08-10 DIAGNOSIS — Z79899 Other long term (current) drug therapy: Secondary | ICD-10-CM

## 2020-08-10 DIAGNOSIS — R0602 Shortness of breath: Secondary | ICD-10-CM

## 2020-08-10 DIAGNOSIS — Z8249 Family history of ischemic heart disease and other diseases of the circulatory system: Secondary | ICD-10-CM

## 2020-08-10 DIAGNOSIS — E785 Hyperlipidemia, unspecified: Secondary | ICD-10-CM | POA: Diagnosis present

## 2020-08-10 LAB — COMPREHENSIVE METABOLIC PANEL
ALT: 14 U/L (ref 0–44)
AST: 30 U/L (ref 15–41)
Albumin: 3.5 g/dL (ref 3.5–5.0)
Alkaline Phosphatase: 85 U/L (ref 38–126)
Anion gap: 10 (ref 5–15)
BUN: 25 mg/dL — ABNORMAL HIGH (ref 8–23)
CO2: 28 mmol/L (ref 22–32)
Calcium: 8.9 mg/dL (ref 8.9–10.3)
Chloride: 97 mmol/L — ABNORMAL LOW (ref 98–111)
Creatinine, Ser: 1.34 mg/dL — ABNORMAL HIGH (ref 0.61–1.24)
GFR, Estimated: 52 mL/min — ABNORMAL LOW (ref >60)
Glucose, Bld: 171 mg/dL — ABNORMAL HIGH (ref 70–99)
Potassium: 4.5 mmol/L (ref 3.5–5.1)
Sodium: 135 mmol/L (ref 135–145)
Total Bilirubin: 0.8 mg/dL (ref 0.3–1.2)
Total Protein: 7.8 g/dL (ref 6.5–8.1)

## 2020-08-10 LAB — CBC WITH DIFFERENTIAL/PLATELET
Abs Immature Granulocytes: 0.07 10*3/uL (ref 0.00–0.07)
Basophils Absolute: 0.1 10*3/uL (ref 0.0–0.1)
Basophils Relative: 0 %
Eosinophils Absolute: 0 10*3/uL (ref 0.0–0.5)
Eosinophils Relative: 0 %
HCT: 41.5 % (ref 39.0–52.0)
Hemoglobin: 13.6 g/dL (ref 13.0–17.0)
Immature Granulocytes: 0 %
Lymphocytes Relative: 2 %
Lymphs Abs: 0.4 10*3/uL — ABNORMAL LOW (ref 0.7–4.0)
MCH: 30.8 pg (ref 26.0–34.0)
MCHC: 32.8 g/dL (ref 30.0–36.0)
MCV: 94.1 fL (ref 80.0–100.0)
Monocytes Absolute: 0.7 10*3/uL (ref 0.1–1.0)
Monocytes Relative: 3 %
Neutro Abs: 19.6 10*3/uL — ABNORMAL HIGH (ref 1.7–7.7)
Neutrophils Relative %: 95 %
Platelets: 244 10*3/uL (ref 150–400)
RBC: 4.41 MIL/uL (ref 4.22–5.81)
RDW: 13.1 % (ref 11.5–15.5)
WBC: 20.8 10*3/uL — ABNORMAL HIGH (ref 4.0–10.5)
nRBC: 0 % (ref 0.0–0.2)

## 2020-08-10 LAB — RESP PANEL BY RT-PCR (FLU A&B, COVID) ARPGX2
Influenza A by PCR: NEGATIVE
Influenza B by PCR: NEGATIVE
SARS Coronavirus 2 by RT PCR: NEGATIVE

## 2020-08-10 LAB — LACTIC ACID, PLASMA: Lactic Acid, Venous: 2.7 mmol/L (ref 0.5–1.9)

## 2020-08-10 MED ORDER — LACTATED RINGERS IV BOLUS (SEPSIS)
750.0000 mL | Freq: Once | INTRAVENOUS | Status: AC
  Administered 2020-08-10: 750 mL via INTRAVENOUS

## 2020-08-10 MED ORDER — LEVOFLOXACIN IN D5W 750 MG/150ML IV SOLN
750.0000 mg | Freq: Once | INTRAVENOUS | Status: AC
  Administered 2020-08-10: 750 mg via INTRAVENOUS
  Filled 2020-08-10: qty 150

## 2020-08-10 MED ORDER — LACTATED RINGERS IV SOLN: INTRAVENOUS | Status: DC

## 2020-08-10 MED ORDER — LACTATED RINGERS IV BOLUS
1000.0000 mL | Freq: Once | INTRAVENOUS | Status: AC
  Administered 2020-08-10: 1000 mL via INTRAVENOUS

## 2020-08-10 NOTE — Progress Notes (Signed)
Pharmacy Antibiotic Note  Jonathan Harmon is a 87 y.o. male admitted on 08/10/2020 with sepsis.  Pharmacy has been consulted for levaquin dosing. He has allergy to penicillin.  Unsure if he has tolerated cephalosporins in the past  Plan: Levaquin 750 mg IV ordered in ED Will f/u allergy with patient  Height: 5\' 7"  (170.2 cm) Weight: 50.6 kg (111 lb 9.6 oz) IBW/kg (Calculated) : 66.1  Temp (24hrs), Avg:98.1 F (36.7 C), Min:98.1 F (36.7 C), Max:98.1 F (36.7 C)  Recent Labs  Lab 08/10/20 2127 08/10/20 2132  WBC  --  20.8*  CREATININE  --  1.34*  LATICACIDVEN 2.7*  --     Estimated Creatinine Clearance: 29.4 mL/min (A) (by C-G formula based on SCr of 1.34 mg/dL (H)).    Allergies  Allergen Reactions  . Penicillins Anaphylaxis  . Penicillin G Swelling     Thank you for allowing pharmacy to be a part of this patient's care.  [address] 08/10/2020 11:03 PM

## 2020-08-10 NOTE — Sepsis Progress Note (Signed)
Following for Code Sepsis  

## 2020-08-10 NOTE — ED Triage Notes (Signed)
Fever today. Sob. He was given Tylenol and Naproxen 2 hours ago.

## 2020-08-10 NOTE — ED Notes (Signed)
Patient with  X-ray 

## 2020-08-10 NOTE — ED Notes (Signed)
Placed on 1.5 L/m [place]

## 2020-08-10 NOTE — ED Provider Notes (Signed)
[facility] EMERGENCY DEPARTMENT Provider Note   CSN: [id number] Arrival date & time: 08/10/20  1848     History Chief Complaint  Patient presents with  . Fever    Kobe Ofallon is a 87 y.o. male.  The history is provided by the patient and a relative.  Fever Max temp prior to arrival:  100 Temp source:  Oral Severity:  Moderate Onset quality:  Sudden Duration:  8 hours Timing:  Constant Progression:  Improving Chronicity:  New Relieved by:  Acetaminophen and ibuprofen Worsened by:  Nothing Ineffective treatments:  None tried Associated symptoms: chills and confusion   Associated symptoms: no cough, no diarrhea, no rhinorrhea and no vomiting   Associated symptoms comment:  Fatigue.  Patient has a deep rattly cough all the time but does not think it is any worse. Risk factors: no recent travel and no sick contacts   Risk factors comment:  History of asbestosis, COPD, hypertension and hyperlipidemia.  Does not require home oxygen and no recent worsening shortness of breath.      Past Medical History:  Diagnosis Date  . Asbestos exposure    diminished lung compacity from asbestos exposure 30 years  . COPD (chronic obstructive pulmonary disease) (HCC)   . Hyperlipidemia   . Hypertension   . Pneumonia    x 4    Patient Active Problem List   Diagnosis Date Noted  . Protein calorie malnutrition (HCC) 05/27/2020  . Arthralgia of both hands 10/29/2019  . Allergic rhinitis 08/31/2018  . Exertional dyspnea 07/28/2016  . COPD (chronic obstructive pulmonary disease) (HCC) 07/28/2016  . Calcified pleural plaque due to asbestos exposure 07/28/2016    Past Surgical History:  Procedure Laterality Date  . APPENDECTOMY    . SHOULDER SURGERY Bilateral    x 5 ( 2 on right ; 3 on left)       Family History  Problem Relation Age of Onset  . Heart attack Father     Social History   Tobacco Use  . Smoking status: Former Smoker    Packs/day: 1.00    Years:  40.00    Pack years: 40.00    Types: Cigarettes    Quit date: 04/25/2000    Years since quitting: 20.3  . Smokeless tobacco: Never Used  Substance Use Topics  . Alcohol use: No    Comment: rarely  . Drug use: No    Home Medications Prior to Admission medications   Medication Sig Start Date End Date Taking? Authorizing Provider  albuterol (VENTOLIN HFA) 108 (90 Base) MCG/ACT inhaler INHALE 2 PUFFS BY MOUTH INTO THE LUNGS EVERY 6 (SIX) HOURS AS NEEDED FOR WHEEZING OR SHORTNESS OF BREATH. 05/27/20 05/27/21 Yes Parrett, Tammy S, NP  atorvastatin (LIPITOR) 40 MG tablet TAKE 1 TABLET BY MOUTH ONCE DAILY 09/03/19 09/02/20 Yes Saguier, Ramon Dredge, PA-C  losartan (COZAAR) 25 MG tablet TAKE 1 TABLET BY MOUTH ONCE DAILY 09/03/19 09/02/20 Yes Saguier, Ramon Dredge, PA-C  doxycycline (VIBRA-TABS) 100 MG tablet TAKE 1 TABLET (100 MG TOTAL) BY MOUTH 2 (TWO) TIMES DAILY. Patient not taking: No sig reported 06/01/20 06/01/21  Saguier, Ramon Dredge, PA-C  methylPREDNISolone (MEDROL DOSEPAK) 4 MG TBPK tablet TAKE 6 TABS BY MOUTH ON DAY 1; 5 TABS ON DAY 2; 4 TABS ON DAY 3; 3 TABS ON DAY 4; 2 TABS ON DAY 5; 1 TAB ON DAY 6 THEN STOP 02/18/20 02/17/21  Maisie Fus, DPM  tiotropium (SPIRIVA) 18 MCG inhalation capsule Place 18 mcg into inhaler and inhale  daily. Patient not taking: No sig reported    [provider]  Tiotropium Bromide-Olodaterol 2.5-2.5 MCG/ACT AERS INHALE 2 PUFFS BY MOUTH INTO THE LUNGS DAILY. 05/27/20 05/27/21  Parrett, Virgel Bouquetammy S, NP    Allergies    Penicillins and Penicillin g  Review of Systems   Review of Systems  Constitutional: Positive for chills and fever.  HENT: Negative for rhinorrhea.   Respiratory: Negative for cough.   Gastrointestinal: Negative for diarrhea and vomiting.  Psychiatric/Behavioral: Positive for confusion.  All other systems reviewed and are negative.   Physical Exam Updated Vital Signs BP 113/60 (BP Location: Right Arm)   Pulse 89   Temp 98.1 F (36.7 C) (Oral)   Resp 19    Ht 5\' 7"  (1.702 m)   Wt 50.6 kg   SpO2 94%   BMI 17.48 kg/m   Physical Exam Vitals and nursing note reviewed.  Constitutional:      General: He is not in acute distress.    Appearance: He is well-developed and underweight.  HENT:     Head: Normocephalic and atraumatic.     Right Ear: Tympanic membrane normal.     Left Ear: Tympanic membrane normal.     Mouth/Throat:     Mouth: Mucous membranes are moist.  Eyes:     Conjunctiva/sclera: Conjunctivae normal.     Pupils: Pupils are equal, round, and reactive to light.  Cardiovascular:     Rate and Rhythm: Normal rate and regular rhythm.     Heart sounds: No murmur heard.   Pulmonary:     Effort: Pulmonary effort is normal. No respiratory distress.     Breath sounds: Decreased breath sounds present. No wheezing or rales.  Abdominal:     General: There is no distension.     Palpations: Abdomen is soft.     Tenderness: There is no abdominal tenderness. There is no guarding or rebound.  Musculoskeletal:        General: No tenderness. Normal range of motion.     Cervical back: Normal range of motion and neck supple.     Right lower leg: No edema.     Left lower leg: No edema.  Skin:    General: Skin is warm and dry.     Findings: No erythema or rash.  Neurological:     Mental Status: He is alert and oriented to person, place, and time. Mental status is at baseline.  Psychiatric:        Mood and Affect: Mood normal.        Behavior: Behavior normal.     ED Results / Procedures / Treatments   Labs (all labs ordered are listed, but only abnormal results are displayed) Labs Reviewed  CBC WITH DIFFERENTIAL/PLATELET - Abnormal; Notable for the following components:      Result Value   WBC 20.8 (*)    Neutro Abs 19.6 (*)    Lymphs Abs 0.4 (*)    All other components within normal limits  COMPREHENSIVE METABOLIC PANEL - Abnormal; Notable for the following components:   Chloride 97 (*)    Glucose, Bld 171 (*)    BUN 25 (*)     Creatinine, Ser 1.34 (*)    GFR, Estimated 52 (*)    All other components within normal limits  LACTIC ACID, PLASMA - Abnormal; Notable for the following components:   Lactic Acid, Venous 2.7 (*)    All other components within normal limits  RESP PANEL BY RT-PCR (FLU  A&B, COVID) ARPGX2  URINE CULTURE  CULTURE, BLOOD (ROUTINE X 2)  CULTURE, BLOOD (ROUTINE X 2)  URINALYSIS, ROUTINE W REFLEX MICROSCOPIC    EKG EKG Interpretation  Date/Time:  Monday August 10 2020 21:16:29 EDT Ventricular Rate:  77 PR Interval:  150 QRS Duration: 92 QT Interval:  381 QTC Calculation: 432 R Axis:   87 Text Interpretation: Sinus rhythm Probable left atrial enlargement Borderline right axis deviation No significant change since last tracing Confirmed by Gwyneth Sprout ([id number]) on 08/10/2020 9:33:23 PM   Radiology DG Chest Port 1 View  Result Date: 08/10/2020 CLINICAL DATA:  Fever and shortness of breath EXAM: PORTABLE CHEST 1 VIEW COMPARISON:  06/01/2020 FINDINGS: Cardiac shadows within normal limits. Aortic calcifications are again seen. Diffuse calcified pleural plaques are again identified bilaterally. Some increased density is noted in the right mid lung laterally when compared with the prior CT examination suspicious for superimposed acute pneumonic infiltrate. No sizable effusion is seen. No bony abnormality is noted. IMPRESSION: Diffuse calcified pleural plaques stable from prior CT. Increased density in the right mid lung as described likely representing acute on chronic infiltrate. Electronically Signed   By: Alcide Clever M.D.   On: 08/10/2020 21:16    Procedures Procedures   Medications Ordered in ED Medications  lactated ringers bolus 1,000 mL (has no administration in time range)    ED Course  I have reviewed the triage vital signs and the nursing notes.  Pertinent labs & imaging results that were available during my care of the patient were reviewed by me and considered in my  medical decision making (see chart for details).    MDM Rules/Calculators/A&P                          Patient is an 87 year old male presenting today with fever that started earlier today sometime before 4 PM.  Patient's son gives the majority of the history.  He reports that patient was fine when he woke up this morning and yesterday was a normal day.  When he got home from work about 4:00 patient was in his pajamas sleeping in bed which is very unusual.  When he woke up he was confused and very sleepy.  He had a temperature of 100.  They gave him Tylenol and ibuprofen.  He has seemed to improve but just continues to feel fatigued.  He denies any sore throat, abdominal pain, nausea, vomiting.  He has chronic shortness of breath but reports that is no different than normal.  Patient's son reports they have a pulse ox monitor at home and it usually is about 92 or 93% on room air.  Patient has been vaccinated up against Covid but has not received a flu shot this year.  He has had no known sick contacts.  On exam patient has no focal findings.  He has full range of motion of his neck and no signs concerning for meningitis or encephalitis.  Concern for possible flu versus Covid.  He denies any urinary symptoms or new cough.  Breath sounds are decreased but feel that is most likely chronic.  Sepsis work-up initiated.  However patient's vital signs are reassuring.  Son reported he did not have anything to eat for lunch or dinner.  He was given IV fluid bolus.  11:00 PM Patient's labs have a leukocytosis of 20,000 with normal hemoglobin, CMP with mild AKI with creatinine of 1.34 from baseline of 1.1, lactate of 2.7  and Covid, flu are negative.  Chest x-ray with increased density in the right mid lung as described which is likely representing acute on chronic infection.  No history of aspiration pneumonia.  EKG without acute findings.  Given patient's age, elevated lactate and white count and concern for new  pneumonia will admit for IV antibiotics and blood cultures.  Patient did receive 30/kg of fluid.  He has remained hemodynamically stable.  Patient in the family were made aware of these findings and are comfortable with this plan  MDM Number of Diagnoses or Management Options   Amount and/or Complexity of Data Reviewed Clinical lab tests: ordered and reviewed Tests in the radiology section of CPT: ordered and reviewed Tests in the medicine section of CPT: ordered and reviewed Decide to obtain previous medical records or to obtain history from someone other than the patient: yes Obtain history from someone other than the patient: yes Review and summarize past medical records: yes Discuss the patient with other providers: yes Independent visualization of images, tracings, or specimens: yes  Risk of Complications, Morbidity, and/or Mortality Presenting problems: high Diagnostic procedures: moderate Management options: moderate  Patient Progress Patient progress: stable  Final Clinical Impression(s) / ED Diagnoses Final diagnoses:  Community acquired pneumonia of right middle lobe of lung  Sepsis without acute organ dysfunction, due to unspecified organism [facility])    Rx / DC Orders ED Discharge Orders    None       Gwyneth Sprout, MD 08/10/20 2306

## 2020-08-10 NOTE — ED Notes (Signed)
Assumed care of this patient. Vitals taken. A&Ox4. Respirations regular/unlabored. PIV obtained. Medicated per MAR. Labs collected/labeled and sent to lab. Connected to cardiac monitor, BP and pulse ox. Stretcher low, wheels locked, call bell within reach. Family at bedside.

## 2020-08-10 NOTE — ED Notes (Signed)
Jonathan Harmon (son) phone number: [phone] please call with any updates or concerns.

## 2020-08-11 ENCOUNTER — Encounter (HOSPITAL_COMMUNITY): Payer: Self-pay | Admitting: Family Medicine

## 2020-08-11 ENCOUNTER — Inpatient Hospital Stay (HOSPITAL_COMMUNITY): Payer: Medicare Other

## 2020-08-11 DIAGNOSIS — I10 Essential (primary) hypertension: Secondary | ICD-10-CM | POA: Diagnosis present

## 2020-08-11 DIAGNOSIS — J189 Pneumonia, unspecified organism: Secondary | ICD-10-CM | POA: Diagnosis present

## 2020-08-11 DIAGNOSIS — J9611 Chronic respiratory failure with hypoxia: Secondary | ICD-10-CM | POA: Diagnosis present

## 2020-08-11 DIAGNOSIS — J920 Pleural plaque with presence of asbestos: Secondary | ICD-10-CM

## 2020-08-11 DIAGNOSIS — J61 Pneumoconiosis due to asbestos and other mineral fibers: Secondary | ICD-10-CM | POA: Diagnosis not present

## 2020-08-11 DIAGNOSIS — R509 Fever, unspecified: Secondary | ICD-10-CM | POA: Diagnosis present

## 2020-08-11 DIAGNOSIS — G929 Unspecified toxic encephalopathy: Secondary | ICD-10-CM | POA: Diagnosis present

## 2020-08-11 DIAGNOSIS — Z79899 Other long term (current) drug therapy: Secondary | ICD-10-CM | POA: Diagnosis not present

## 2020-08-11 DIAGNOSIS — R7989 Other specified abnormal findings of blood chemistry: Secondary | ICD-10-CM | POA: Diagnosis not present

## 2020-08-11 DIAGNOSIS — E860 Dehydration: Secondary | ICD-10-CM | POA: Diagnosis present

## 2020-08-11 DIAGNOSIS — Z880 Allergy status to penicillin: Secondary | ICD-10-CM | POA: Diagnosis not present

## 2020-08-11 DIAGNOSIS — Z20822 Contact with and (suspected) exposure to covid-19: Secondary | ICD-10-CM | POA: Diagnosis present

## 2020-08-11 DIAGNOSIS — J440 Chronic obstructive pulmonary disease with acute lower respiratory infection: Secondary | ICD-10-CM | POA: Diagnosis present

## 2020-08-11 DIAGNOSIS — J449 Chronic obstructive pulmonary disease, unspecified: Secondary | ICD-10-CM

## 2020-08-11 DIAGNOSIS — J9601 Acute respiratory failure with hypoxia: Secondary | ICD-10-CM | POA: Diagnosis present

## 2020-08-11 DIAGNOSIS — Z77090 Contact with and (suspected) exposure to asbestos: Secondary | ICD-10-CM | POA: Diagnosis present

## 2020-08-11 DIAGNOSIS — Z8249 Family history of ischemic heart disease and other diseases of the circulatory system: Secondary | ICD-10-CM | POA: Diagnosis not present

## 2020-08-11 DIAGNOSIS — Z87891 Personal history of nicotine dependence: Secondary | ICD-10-CM | POA: Diagnosis not present

## 2020-08-11 DIAGNOSIS — E785 Hyperlipidemia, unspecified: Secondary | ICD-10-CM | POA: Diagnosis present

## 2020-08-11 LAB — LACTIC ACID, PLASMA
Lactic Acid, Venous: 1.9 mmol/L (ref 0.5–1.9)
Lactic Acid, Venous: 1.5 mmol/L (ref 0.5–1.9)

## 2020-08-11 LAB — PROCALCITONIN: Procalcitonin: 13.2 ng/mL

## 2020-08-11 LAB — BASIC METABOLIC PANEL
Anion gap: 6 (ref 5–15)
BUN: 19 mg/dL (ref 8–23)
CO2: 28 mmol/L (ref 22–32)
Calcium: 8.5 mg/dL — ABNORMAL LOW (ref 8.9–10.3)
Chloride: 100 mmol/L (ref 98–111)
Creatinine, Ser: 1.18 mg/dL (ref 0.61–1.24)
GFR, Estimated: >60 mL/min (ref >60)
Glucose, Bld: 109 mg/dL — ABNORMAL HIGH (ref 70–99)
Potassium: 4.3 mmol/L (ref 3.5–5.1)
Sodium: 134 mmol/L — ABNORMAL LOW (ref 135–145)

## 2020-08-11 LAB — CBC
HCT: 34.6 % — ABNORMAL LOW (ref 39.0–52.0)
Hemoglobin: 11.4 g/dL — ABNORMAL LOW (ref 13.0–17.0)
MCH: 30.9 pg (ref 26.0–34.0)
MCHC: 32.9 g/dL (ref 30.0–36.0)
MCV: 93.8 fL (ref 80.0–100.0)
Platelets: 231 10*3/uL (ref 150–400)
RBC: 3.69 MIL/uL — ABNORMAL LOW (ref 4.22–5.81)
RDW: 13.2 % (ref 11.5–15.5)
WBC: 16.8 10*3/uL — ABNORMAL HIGH (ref 4.0–10.5)
nRBC: 0 % (ref 0.0–0.2)

## 2020-08-11 LAB — HIV ANTIBODY (ROUTINE TESTING W REFLEX): HIV Screen 4th Generation wRfx: NONREACTIVE

## 2020-08-11 LAB — URINALYSIS, ROUTINE W REFLEX MICROSCOPIC
Bilirubin Urine: NEGATIVE
Glucose, UA: NEGATIVE mg/dL
Ketones, ur: NEGATIVE mg/dL
Leukocytes,Ua: NEGATIVE
Nitrite: NEGATIVE
Protein, ur: NEGATIVE mg/dL
Specific Gravity, Urine: 1.02 (ref 1.005–1.030)
pH: 5.5 (ref 5.0–8.0)

## 2020-08-11 LAB — URINALYSIS, MICROSCOPIC (REFLEX)

## 2020-08-11 LAB — MAGNESIUM: Magnesium: 1.4 mg/dL — ABNORMAL LOW (ref 1.7–2.4)

## 2020-08-11 LAB — BRAIN NATRIURETIC PEPTIDE: B Natriuretic Peptide: 187.2 pg/mL — ABNORMAL HIGH (ref 0.0–100.0)

## 2020-08-11 LAB — D-DIMER, QUANTITATIVE: D-Dimer, Quant: 2.55 ug/mL-FEU — ABNORMAL HIGH (ref 0.00–0.50)

## 2020-08-11 LAB — MRSA PCR SCREENING: MRSA by PCR: NEGATIVE

## 2020-08-11 LAB — C-REACTIVE PROTEIN: CRP: 14.7 mg/dL — ABNORMAL HIGH (ref <1.0)

## 2020-08-11 MED ORDER — ALBUTEROL SULFATE HFA 108 (90 BASE) MCG/ACT IN AERS: 2.0000 | INHALATION_SPRAY | Freq: Four times a day (QID) | RESPIRATORY_TRACT | Status: DC | PRN

## 2020-08-11 MED ORDER — UMECLIDINIUM BROMIDE 62.5 MCG/INH IN AEPB
1.0000 | INHALATION_SPRAY | Freq: Every day | RESPIRATORY_TRACT | Status: DC
  Administered 2020-08-11 – 2020-08-12 (×2): 1 via RESPIRATORY_TRACT
  Filled 2020-08-11 (×2): qty 7

## 2020-08-11 MED ORDER — ENOXAPARIN SODIUM 40 MG/0.4ML ~~LOC~~ SOLN
40.0000 mg | SUBCUTANEOUS | Status: DC
  Administered 2020-08-11 – 2020-08-12 (×2): 40 mg via SUBCUTANEOUS
  Filled 2020-08-11 (×2): qty 0.4

## 2020-08-11 MED ORDER — ATORVASTATIN CALCIUM 40 MG PO TABS
40.0000 mg | ORAL_TABLET | Freq: Every day | ORAL | Status: DC
  Administered 2020-08-11: 40 mg via ORAL
  Filled 2020-08-11: qty 1

## 2020-08-11 MED ORDER — ARFORMOTEROL TARTRATE 15 MCG/2ML IN NEBU
15.0000 ug | INHALATION_SOLUTION | Freq: Two times a day (BID) | RESPIRATORY_TRACT | Status: DC
  Administered 2020-08-11 – 2020-08-12 (×4): 15 ug via RESPIRATORY_TRACT
  Filled 2020-08-11 (×4): qty 2

## 2020-08-11 MED ORDER — MAGNESIUM SULFATE 2 GM/50ML IV SOLN
2.0000 g | Freq: Once | INTRAVENOUS | Status: AC
  Administered 2020-08-11: 2 g via INTRAVENOUS
  Filled 2020-08-11: qty 50

## 2020-08-11 MED ORDER — ORAL CARE MOUTH RINSE
15.0000 mL | Freq: Two times a day (BID) | OROMUCOSAL | Status: DC
  Administered 2020-08-11 (×2): 15 mL via OROMUCOSAL

## 2020-08-11 MED ORDER — LEVOFLOXACIN 250 MG PO TABS
250.0000 mg | ORAL_TABLET | Freq: Every day | ORAL | Status: DC
  Administered 2020-08-11 – 2020-08-12 (×2): 250 mg via ORAL
  Filled 2020-08-11 (×2): qty 1

## 2020-08-11 MED ORDER — HALOPERIDOL LACTATE 5 MG/ML IJ SOLN
1.0000 mg | Freq: Once | INTRAMUSCULAR | Status: AC | PRN
  Administered 2020-08-11: 1 mg via INTRAVENOUS
  Filled 2020-08-11: qty 1

## 2020-08-11 MED ORDER — LACTATED RINGERS IV SOLN: INTRAVENOUS | Status: AC

## 2020-08-11 NOTE — Progress Notes (Addendum)
PROGRESS NOTE                                                                                                                                                                                                             Patient Demographics:    Jonathan Harmon, is a 87 y.o. male, DOB - Jan 23, 1934, ZOX:[id number]  Outpatient Primary MD for the patient is Marisue Brooklyn    LOS - 0  Admit date - 08/10/2020    Chief Complaint  Patient presents with  . Fever       Brief Narrative (HPI from H&P) - Jonathan Harmon is a 87 y.o. male with medical history significant of COPD, asbestosis, HTN, he presented to the hospital with chief complaints of fever chills and confusion.  Was diagnosed with pneumonia admitted to the hospital.   Subjective:    Jonathan Harmon today has, No headache, No chest pain, No abdominal pain - No Nausea, No new weakness tingling or numbness, no SOB.   Assessment  & Plan :      1. Acute Hypoxic Resp. Failure due to CAP and possibly early sepsis - he appears to improve community-acquired pneumonia in the right middle lobe, aspiration to be ruled out.  He has been placed on IV Levaquin with good response, has been hydrated, sepsis pathophysiology seems to have resolved.  Follow-up cultures along with MRSA nasal PCR, speech evaluation - monitor, clinically better.   Encouraged the patient to sit up in chair in the daytime use I-S and flutter valve for pulmonary toiletry.  Will advance activity and titrate down oxygen as possible.   2.  Toxic encephalopathy.  Question underlying mild dementia.  Supportive care, minimize narcotics and benzodiazepines.  Use Haldol as needed.  3.  Dyslipidemia.  On statin continue.  4.  History of COPD/asbestosis.  Supportive care.  5.  Elevated D-dimer.  Likely due to combination of dehydration and infection.  COVID continue prophylactic dose Lovenox and check leg venous  ultrasound.  6.  Hypomagnesemia..  Replaced.       Condition - Extremely Guarded  Family Communication  : Left message for son [name] [phone]   on 08/11/2020 at 10:05 AM  Code Status :  Full  Consults  :  None  PUD Prophylaxis :    Procedures  :  Disposition Plan  :    Status is: Inpatient  Remains inpatient appropriate because:IV treatments appropriate due to intensity of illness or inability to take PO   Dispo: The patient is from: Home              Anticipated d/c is to: Home              Patient currently is not medically stable to d/c.   Difficult to place patient No  DVT Prophylaxis  :  Lovenox    Lab Results  Component Value Date   PLT 231 08/11/2020    Diet :  Diet Order            Diet Heart Room service appropriate? Yes; Fluid consistency: Thin  Diet effective now                  Inpatient Medications  Scheduled Meds: . arformoterol  15 mcg Nebulization BID  . atorvastatin  40 mg Oral Daily  . enoxaparin (LOVENOX) injection  40 mg Subcutaneous Q24H  . umeclidinium bromide  1 puff Inhalation Daily   Continuous Infusions: . lactated ringers 75 mL/hr at 08/11/20 0753  . magnesium sulfate bolus IVPB     PRN Meds:.albuterol  Antibiotics  :    Anti-infectives (From admission, onward)   Start     Dose/Rate Route Frequency Ordered Stop   08/10/20 2300  levofloxacin (LEVAQUIN) IVPB 750 mg        750 mg 100 mL/hr over 90 Minutes Intravenous  Once 08/10/20 2259 08/11/20 0110       Time Spent in minutes  30   Susa RaringPrashant Sheliah Fiorillo M.D on 08/11/2020 at 10:12 AM  To page go to [email or web address]   [facility] -  Office  [phone]    See all Orders from today for further details    Objective:   Vitals:   08/11/20 0355 08/11/20 0500 08/11/20 0809 08/11/20 0840  BP: (!) 109/55 114/61 109/88   Pulse: 73 78 78   Resp: (!) 21 20 15    Temp:  98.2 F (36.8 C) 98.4 F (36.9 C)   TempSrc:  Axillary Oral   SpO2: 96% 94% 96%  93%  Weight:  51.7 kg    Height:  5\' 8"  (1.727 m)      Wt Readings from Last 3 Encounters:  08/11/20 51.7 kg  06/08/20 52.7 kg  06/01/20 54.9 kg     Intake/Output Summary (Last 24 hours) at 08/11/2020 1012 Last data filed at 08/11/2020 0945 Gross per 24 hour  Intake 2309.47 ml  Output 175 ml  Net 2134.47 ml     Physical Exam  Awake , mildly confused, No new F.N deficits,   [place].AT,PERRAL Supple Neck,No JVD, No cervical lymphadenopathy appriciated.  Symmetrical Chest wall movement, Good air movement bilaterally, few rales RRR,No Gallops,Rubs or new Murmurs, No Parasternal Heave +ve B.Sounds, Abd Soft, No tenderness, No organomegaly appriciated, No rebound - guarding or rigidity. No Cyanosis, Clubbing or edema, No new Rash or bruise      Data Review:    CBC Recent Labs  Lab 08/10/20 2132 08/11/20 0725  WBC 20.8* 16.8*  HGB 13.6 11.4*  HCT 41.5 34.6*  PLT 244 231  MCV 94.1 93.8  MCH 30.8 30.9  MCHC 32.8 32.9  RDW 13.1 13.2  LYMPHSABS 0.4*  --   MONOABS 0.7  --   EOSABS 0.0  --   BASOSABS 0.1  --     Recent Labs  Lab  08/10/20 2127 08/10/20 2132 08/11/20 0453 08/11/20 0725 08/11/20 0735  NA  --  135  --  134*  --   K  --  4.5  --  4.3  --   CL  --  97*  --  100  --   CO2  --  28  --  28  --   GLUCOSE  --  171*  --  109*  --   BUN  --  25*  --  19  --   CREATININE  --  1.34*  --  1.18  --   CALCIUM  --  8.9  --  8.5*  --   AST  --  30  --   --   --   ALT  --  14  --   --   --   ALKPHOS  --  85  --   --   --   BILITOT  --  0.8  --   --   --   ALBUMIN  --  3.5  --   --   --   MG  --   --   --  1.4*  --   CRP  --   --   --  14.7*  --   DDIMER  --   --   --  2.55*  --   LATICACIDVEN 2.7*  --  1.9  --  1.5  BNP  --   --   --  187.2*  --     ------------------------------------------------------------------------------------------------------------------ No results for input(s): CHOL, HDL, LDLCALC, TRIG, CHOLHDL, LDLDIRECT in the last 72 hours.  No  results found for: HGBA1C ------------------------------------------------------------------------------------------------------------------ No results for input(s): TSH, T4TOTAL, T3FREE, THYROIDAB in the last 72 hours.  Invalid input(s): FREET3  Cardiac Enzymes No results for input(s): CKMB, TROPONINI, MYOGLOBIN in the last 168 hours.  Invalid input(s): CK ------------------------------------------------------------------------------------------------------------------    Component Value Date/Time   BNP 187.2 (H) 08/11/2020 0725    Micro Results Recent Results (from the past 240 hour(s))  Resp Panel by RT-PCR (Flu A&B, Covid) Nasopharyngeal Swab     Status: None   Collection Time: 08/10/20  9:27 PM   Specimen: Nasopharyngeal Swab; Nasopharyngeal(NP) swabs in vial transport medium  Result Value Ref Range Status   SARS Coronavirus 2 by RT PCR NEGATIVE NEGATIVE Final    Comment: (NOTE) SARS-CoV-2 target nucleic acids are NOT DETECTED.  The SARS-CoV-2 RNA is generally detectable in upper respiratory specimens during the acute phase of infection. The lowest concentration of SARS-CoV-2 viral copies this assay can detect is 138 copies/mL. A negative result does not preclude SARS-Cov-2 infection and should not be used as the sole basis for treatment or other patient management decisions. A negative result may occur with  improper specimen collection/handling, submission of specimen other than nasopharyngeal swab, presence of viral mutation(s) within the areas targeted by this assay, and inadequate number of viral copies(<138 copies/mL). A negative result must be combined with clinical observations, patient history, and epidemiological information. The expected result is Negative.  Fact Sheet for Patients:  BloggerCourse.com  Fact Sheet for Healthcare Providers:  SeriousBroker.it  This test is no t yet approved or cleared by the  [place] FDA and  has been authorized for detection and/or diagnosis of SARS-CoV-2 by FDA under an Emergency Use Authorization (EUA). This EUA will remain  in effect (meaning this test can be used) for the duration of the COVID-19 declaration under Section 564(b)(1) of the Act, 21 U.S.C.section  360bbb-3(b)(1), unless the authorization is terminated  or revoked sooner.       Influenza A by PCR NEGATIVE NEGATIVE Final   Influenza B by PCR NEGATIVE NEGATIVE Final    Comment: (NOTE) The Xpert Xpress SARS-CoV-2/FLU/RSV plus assay is intended as an aid in the diagnosis of influenza from Nasopharyngeal swab specimens and should not be used as a sole basis for treatment. Nasal washings and aspirates are unacceptable for Xpert Xpress SARS-CoV-2/FLU/RSV testing.  Fact Sheet for Patients: BloggerCourse.com  Fact Sheet for Healthcare Providers: SeriousBroker.it  This test is not yet approved or cleared by the [place] FDA and has been authorized for detection and/or diagnosis of SARS-CoV-2 by FDA under an Emergency Use Authorization (EUA). This EUA will remain in effect (meaning this test can be used) for the duration of the COVID-19 declaration under Section 564(b)(1) of the Act, 21 U.S.C. section 360bbb-3(b)(1), unless the authorization is terminated or revoked.  Performed at [facility], [address] [place], [place] [address]     Radiology Reports DG Chest [place] 1 View  Result Date: 08/11/2020 CLINICAL DATA:  Fever.  Shortness of breath. EXAM: PORTABLE CHEST 1 VIEW COMPARISON:  Chest x-ray 08/10/2020. CT chest 06/01/2020, 09/06/2018. FINDINGS: Mediastinum and hilar structures normal. Heart size stable. Persistent increased density right mid lung suggesting the possibility of pneumonia. No interim change. Persistent bilateral pleural-parenchymal thickening consistent with scarring. Persistent bilateral dense  calcified pleural plaques. Findings consistent with prior asbestos exposure. No pneumothorax. IMPRESSION: 1. Persistent increased density right mid lung. Again pneumonia cannot be excluded. No interim change from 08/10/2020. 2. Persistent bilateral pleural-parenchymal thickening consistent with scarring. Prominent calcified bilateral pleural plaques again noted consistent with prior asbestos exposure. Electronically Signed   By: Maisie Fus  Register   On: 08/11/2020 07:48   DG Chest Port 1 View  Result Date: 08/10/2020 CLINICAL DATA:  Fever and shortness of breath EXAM: PORTABLE CHEST 1 VIEW COMPARISON:  06/01/2020 FINDINGS: Cardiac shadows within normal limits. Aortic calcifications are again seen. Diffuse calcified pleural plaques are again identified bilaterally. Some increased density is noted in the right mid lung laterally when compared with the prior CT examination suspicious for superimposed acute pneumonic infiltrate. No sizable effusion is seen. No bony abnormality is noted. IMPRESSION: Diffuse calcified pleural plaques stable from prior CT. Increased density in the right mid lung as described likely representing acute on chronic infiltrate. Electronically Signed   By: Alcide Clever M.D.   On: 08/10/2020 21:16

## 2020-08-11 NOTE — Progress Notes (Signed)
Pharmacy Antibiotic Note  Jonathan Harmon is a 87 y.o. male admitted on 08/10/2020 with pneumonia.  Pharmacy has been consulted for levaquin dosing.  Pt got a dose of Levaquin in the ED due to a hx of PCN allergy. There is no record of cephalosporin use in the system. Dr. Thedore Mins would like to use levaquin x 5 days.   CrCl~34 ml/min  Plan: Levaquin 250mg  PO qday x 4 more days Rx will sign off  Height: 5\' 8"  (172.7 cm) Weight: 51.7 kg (113 lb 15.7 oz) IBW/kg (Calculated) : 68.4  Temp (24hrs), Avg:98.2 F (36.8 C), Min:98.1 F (36.7 C), Max:98.4 F (36.9 C)  Recent Labs  Lab 08/10/20 2127 08/10/20 2132 08/11/20 0453 08/11/20 0725 08/11/20 0735  WBC  --  20.8*  --  16.8*  --   CREATININE  --  1.34*  --  1.18  --   LATICACIDVEN 2.7*  --  1.9  --  1.5    Estimated Creatinine Clearance: 34.1 mL/min (by C-G formula based on SCr of 1.18 mg/dL).    Allergies  Allergen Reactions  . Penicillins Anaphylaxis  . Penicillin G Swelling    Antimicrobials this admission: 4/18 Levaquin>>4/22  Dose adjustments this admission:   Microbiology results: 4/18 blood>> 4/19 MRSA PCR neg 4/18 urine>>  5/19, PharmD, [place], AAHIVP, CPP Infectious Disease Pharmacist 08/11/2020 11:24 AM

## 2020-08-11 NOTE — Progress Notes (Addendum)
Patient displaying some confusion, A&Ox1. Told me that he walked here from [place]. Repeatedly asking why he is here, and that he is not sick, is removing nasal cannula. Will continue to reorient the patient.

## 2020-08-11 NOTE — Progress Notes (Signed)
Notified provider of need to order repeat lactic acid. ° °

## 2020-08-11 NOTE — Evaluation (Signed)
Occupational Therapy Evaluation Patient Details Name: Jonathan Harmon MRN: [id number] DOB: 12/23/1933 Today's Date: 08/11/2020    History of Present Illness Bates Collington is a 87 y.o. male with medical history significant of COPD, asbestosis, HTN, he presented to the hospital with chief complaints of fever chills and confusion.  Was diagnosed with pneumonia admitted to the hospital.   Clinical Impression   PTA, pt lives with son and daughter in law who work. Pt reports Independence with ADLs and mobility without AD. Pt reports still driving to grocery store, etc. Pt A&Ox2 and presents with deficits in endurance, cognition and safety awareness. Pt overall min guard for mobility without AD with improving steadiness with progressed activity. Pt overall Supervision for ADLs due to need for cues for safety. Pt received on RA though variable pleth and unable to obtain pulse ox reading. No OT follow-up indicated at discharge though pt would benefit from acute OT to maximize safety and independence with ADLs. Recommend assist with med mgmt, supervision initially for community outings due to cognitive deficits. PT/OT contacted family via phone to provide insight into recommendations.     Follow Up Recommendations  No OT follow up;Supervision - Intermittent (assist with med management)    Equipment Recommendations  None recommended by OT    Recommendations for Other Services       Precautions / Restrictions Precautions Precautions: Fall;Other (comment) Precaution Comments: monitor O2 Restrictions Weight Bearing Restrictions: No      Mobility Bed Mobility Overal bed mobility: Needs Assistance Bed Mobility: Supine to Sit     Supine to sit: Supervision;HOB elevated     General bed mobility comments: Supervision for line safety, mgmt    Transfers Overall transfer level: Needs assistance Equipment used: None Transfers: Sit to/from Stand Sit to Stand: Supervision         General  transfer comment: supervision for sit to stand without AD, min guard initially for dynamic tasks without AD for safety due to initial mild unsteadiness.    Balance Overall balance assessment: Needs assistance Sitting-balance support: No upper extremity supported;Feet supported Sitting balance-Leahy Scale: Good     Standing balance support: No upper extremity supported;During functional activity Standing balance-Leahy Scale: Good                             ADL either performed or assessed with clinical judgement   ADL Overall ADL's : Needs assistance/impaired Eating/Feeding: Independent;Sitting   Grooming: Supervision/safety;Standing   Upper Body Bathing: Supervision/ safety   Lower Body Bathing: Supervison/ safety   Upper Body Dressing : Supervision/safety   Lower Body Dressing: Supervision/safety Lower Body Dressing Details (indicate cue type and reason): able to don socks sitting EOB, cues for line mgmt Toilet Transfer: Min guard;Ambulation   Toileting- Clothing Manipulation and Hygiene: Supervision/safety;Sit to/from stand;Sitting/lateral lean       Functional mobility during ADLs: Min guard General ADL Comments: Noted decreased safety awareness though no overt LOB noted, decreased endurance     Vision Baseline Vision/History: Wears glasses Wears Glasses: Reading only Patient Visual Report: No change from baseline Vision Assessment?: No apparent visual deficits     Perception     Praxis      Pertinent Vitals/Pain Pain Assessment: No/denies pain     Hand Dominance Right   Extremity/Trunk Assessment Upper Extremity Assessment Upper Extremity Assessment: Overall WFL for tasks assessed   Lower Extremity Assessment Lower Extremity Assessment: Defer to PT evaluation   Cervical / Trunk  Assessment Cervical / Trunk Assessment: Normal   Communication Communication Communication: No difficulties   Cognition Arousal/Alertness:  Awake/alert Behavior During Therapy: WFL for tasks assessed/performed;Impulsive Overall Cognitive Status: No family/caregiver present to determine baseline cognitive functioning Area of Impairment: Orientation;Attention;Memory;Following commands;Safety/judgement;Awareness;Problem solving                 Orientation Level: Disoriented to;Time;Situation Current Attention Level: Selective Memory: Decreased short-term memory Following Commands: Follows one step commands with increased time;Follows multi-step commands with increased time Safety/Judgement: Decreased awareness of safety Awareness: Emergent Problem Solving: Difficulty sequencing;Requires verbal cues General Comments: Pt with some impulsivity, requiring cues for safety especially with lines. Decreased attention, problem solving. noted in chart question of underlying dementia. When asked orientation questions, such as date - pt looked at newspaper in room though date incorrect   General Comments  Pt on RA on entry, variable pleth on monitor and unable to obtain reading with pulse ox. Pt's son and daughter in law present at start of session though exited prior to obtaining PLOF and OOB assessment    Exercises     Shoulder Instructions      Home Living Family/patient expects to be discharged to:: Private residence Living Arrangements: Children Available Help at Discharge: Family Type of Home: House Home Access: Stairs to enter Secretary/administrator of Steps: 1   Home Layout: Two level;Able to live on main level with bedroom/bathroom     Bathroom Shower/Tub: Producer, television/film/video: Standard     Home Equipment: None          Prior Functioning/Environment Level of Independence: Independent        Comments: pt reports independence with ADLs and mobility. Family assists with meals, laundry, etc. Pt reports still driving short distances. Pt reports walking 1 mile every other day, used to enjoy golfing.  Per daughter in law, she assists with med mgmt.        OT Problem List: Impaired balance (sitting and/or standing);Decreased activity tolerance;Decreased cognition;Decreased safety awareness      OT Treatment/Interventions: Self-care/ADL training;Therapeutic exercise;DME and/or AE instruction;Therapeutic activities;Patient/family education;Balance training    OT Goals(Current goals can be found in the care plan section) Acute Rehab OT Goals Patient Stated Goal: go home tomorrow OT Goal Formulation: With patient Time For Goal Achievement: 08/25/20 Potential to Achieve Goals: Good ADL Goals Pt Will Transfer to Toilet: Independently;ambulating Pt Will Perform Toileting - Clothing Manipulation and hygiene: Independently;sitting/lateral leans;sit to/from stand Additional ADL Goal #1: Pt to increase standing tolerance during ADLs/mobility to > 7 min to maximize endurance/safety with tasks Additional ADL Goal #2: Pt to complete Short Blessed Test for further cognitive assessment to maximize appropriate safety recommendations  OT Frequency: Min 2X/week   Barriers to D/C:            Co-evaluation              AM-PAC OT "6 Clicks" Daily Activity     Outcome Measure Help from another person eating meals?: None Help from another person taking care of personal grooming?: A Little Help from another person toileting, which includes using toliet, bedpan, or urinal?: A Little Help from another person bathing (including washing, rinsing, drying)?: A Little Help from another person to put on and taking off regular upper body clothing?: A Little Help from another person to put on and taking off regular lower body clothing?: A Little 6 Click Score: 19   End of Session Equipment Utilized During Treatment: Gait belt Nurse Communication: Mobility  status;Other (comment) (unable to obtain O2 reading)  Activity Tolerance: Patient tolerated treatment well Patient left: in bed;with call bell/phone  within reach;Other (comment) (sitting EOB with PT)  OT Visit Diagnosis: Unsteadiness on feet (R26.81);Other abnormalities of gait and mobility (R26.89);Other symptoms and signs involving cognitive function                Time: 1318-1340 OT Time Calculation (min): 22 min Charges:  OT General Charges $OT Visit: 1 Visit OT Evaluation $OT Eval Moderate Complexity: 1 Mod  Bradd Canary, OTR/L Acute Rehab Services Office: [phone]  Lorre Munroe 08/11/2020, 2:19 PM
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
## 2020-08-11 NOTE — H&P (Signed)
History and Physical    Jonathan Harmon ZOX:[id number] DOB: [birth year] DOA: 08/10/2020  PCP: Esperanza Richters, PA-C  Patient coming from: Home  I have personally briefly reviewed patient's old medical records in [facility]  Chief Complaint: Fever  HPI: Jonathan Harmon is a 87 y.o. male with medical history significant of COPD, asbestosis, HTN.  Pt presents to ED at [facility] with c/o fever, chills, confusion.  No sick contacts.  Symptoms onset suddenly earlier yesterday about 8h PTA.  Symptoms are constant, got somewhat better with tylenol and ibuprofen.  Has cough, but this seems to be baseline per patient.  No diarrhea, no abd pain, no vomiting.   ED Course: Satting 87% on RA (normally 92-94% on RA per son).  Improved on 2L via [facility].  CXR reveals right mid lung PNA.  WBC 20k.  No other definate SIRS documented in ED.  Lactate 2.7.  Pt given sepsis fluid bolus, put on empiric levaquin.   Review of Systems: As per HPI, otherwise all review of systems negative.  Past Medical History:  Diagnosis Date  . Asbestos exposure    diminished lung compacity from asbestos exposure 30 years  . COPD (chronic obstructive pulmonary disease) (HCC)   . Hyperlipidemia   . Hypertension   . Pneumonia    x 4    Past Surgical History:  Procedure Laterality Date  . APPENDECTOMY    . SHOULDER SURGERY Bilateral    x 5 ( 2 on right ; 3 on left)     reports that he quit smoking about 20 years ago. His smoking use included cigarettes. He has a 40.00 pack-year smoking history. He has never used smokeless tobacco. He reports that he does not drink alcohol and does not use drugs.  Allergies  Allergen Reactions  . Penicillins Anaphylaxis  . Penicillin G Swelling    Family History  Problem Relation Age of Onset  . Heart attack Father      Prior to Admission medications   Medication Sig Start Date End Date Taking? Authorizing Provider  albuterol (VENTOLIN HFA) 108 (90 Base) MCG/ACT inhaler  INHALE 2 PUFFS BY MOUTH INTO THE LUNGS EVERY 6 (SIX) HOURS AS NEEDED FOR WHEEZING OR SHORTNESS OF BREATH. 05/27/20 05/27/21 Yes Parrett, Tammy S, NP  atorvastatin (LIPITOR) 40 MG tablet TAKE 1 TABLET BY MOUTH ONCE DAILY 09/03/19 09/02/20 Yes Saguier, Ramon Dredge, PA-C  losartan (COZAAR) 25 MG tablet TAKE 1 TABLET BY MOUTH ONCE DAILY 09/03/19 09/02/20 Yes Saguier, Ramon Dredge, PA-C  methylPREDNISolone (MEDROL DOSEPAK) 4 MG TBPK tablet TAKE 6 TABS BY MOUTH ON DAY 1; 5 TABS ON DAY 2; 4 TABS ON DAY 3; 3 TABS ON DAY 4; 2 TABS ON DAY 5; 1 TAB ON DAY 6 THEN STOP 02/18/20 02/17/21  Maisie Fus, DPM  Tiotropium Bromide-Olodaterol 2.5-2.5 MCG/ACT AERS INHALE 2 PUFFS BY MOUTH INTO THE LUNGS DAILY. 05/27/20 05/27/21  Julio Sicks, NP    Physical Exam: Vitals:   08/11/20 0145 08/11/20 0300 08/11/20 0355 08/11/20 0500  BP: (!) 107/55 (!) 109/50 (!) 109/55 114/61  Pulse: 71 73 73 78  Resp: 14 (!) 25 (!) 21 20  Temp:    98.2 F (36.8 C)  TempSrc:    Axillary  SpO2: 91% 100% 96% 94%  Weight:    51.7 kg  Height:    5\' 8"  (1.727 m)    Constitutional: NAD, calm, comfortable Eyes: PERRL, lids and conjunctivae normal ENMT: Mucous membranes are moist. Posterior pharynx clear of any exudate or lesions.Normal  dentition.  Neck: normal, supple, no masses, no thyromegaly Respiratory: Diminished BS Cardiovascular: Regular rate and rhythm, no murmurs / rubs / gallops. No extremity edema. 2+ pedal pulses. No carotid bruits.  Abdomen: no tenderness, no masses palpated. No hepatosplenomegaly. Bowel sounds positive.  Musculoskeletal: no clubbing / cyanosis. No joint deformity upper and lower extremities. Good ROM, no contractures. Normal muscle tone.  Skin: no rashes, lesions, ulcers. No induration Neurologic: CN 2-12 grossly intact. Sensation intact, DTR normal. Strength 5/5 in all 4.  Psychiatric: Normal judgment and insight. Alert and oriented x 3. Normal mood.    Labs on Admission: I have personally reviewed following labs and  imaging studies  CBC: Recent Labs  Lab 08/10/20 2132  WBC 20.8*  NEUTROABS 19.6*  HGB 13.6  HCT 41.5  MCV 94.1  PLT 244   Basic Metabolic Panel: Recent Labs  Lab 08/10/20 2132  NA 135  K 4.5  CL 97*  CO2 28  GLUCOSE 171*  BUN 25*  CREATININE 1.34*  CALCIUM 8.9   GFR: Estimated Creatinine Clearance: 30 mL/min (A) (by C-G formula based on SCr of 1.34 mg/dL (H)). Liver Function Tests: Recent Labs  Lab 08/10/20 2132  AST 30  ALT 14  ALKPHOS 85  BILITOT 0.8  PROT 7.8  ALBUMIN 3.5   No results for input(s): LIPASE, AMYLASE in the last 168 hours. No results for input(s): AMMONIA in the last 168 hours. Coagulation Profile: No results for input(s): INR, PROTIME in the last 168 hours. Cardiac Enzymes: No results for input(s): CKTOTAL, CKMB, CKMBINDEX, TROPONINI in the last 168 hours. BNP (last 3 results) No results for input(s): PROBNP in the last 8760 hours. HbA1C: No results for input(s): HGBA1C in the last 72 hours. CBG: No results for input(s): GLUCAP in the last 168 hours. Lipid Profile: No results for input(s): CHOL, HDL, LDLCALC, TRIG, CHOLHDL, LDLDIRECT in the last 72 hours. Thyroid Function Tests: No results for input(s): TSH, T4TOTAL, FREET4, T3FREE, THYROIDAB in the last 72 hours. Anemia Panel: No results for input(s): VITAMINB12, FOLATE, FERRITIN, TIBC, IRON, RETICCTPCT in the last 72 hours. Urine analysis:    Component Value Date/Time   COLORURINE YELLOW 08/10/2020 2127   APPEARANCEUR CLEAR 08/10/2020 2127   LABSPEC 1.020 08/10/2020 2127   PHURINE 5.5 08/10/2020 2127   GLUCOSEU NEGATIVE 08/10/2020 2127   HGBUR MODERATE (A) 08/10/2020 2127   BILIRUBINUR NEGATIVE 08/10/2020 2127   KETONESUR NEGATIVE 08/10/2020 2127   PROTEINUR NEGATIVE 08/10/2020 2127   NITRITE NEGATIVE 08/10/2020 2127   LEUKOCYTESUR NEGATIVE 08/10/2020 2127    Radiological Exams on Admission: DG Chest Port 1 View  Result Date: 08/10/2020 CLINICAL DATA:  Fever and shortness  of breath EXAM: PORTABLE CHEST 1 VIEW COMPARISON:  06/01/2020 FINDINGS: Cardiac shadows within normal limits. Aortic calcifications are again seen. Diffuse calcified pleural plaques are again identified bilaterally. Some increased density is noted in the right mid lung laterally when compared with the prior CT examination suspicious for superimposed acute pneumonic infiltrate. No sizable effusion is seen. No bony abnormality is noted. IMPRESSION: Diffuse calcified pleural plaques stable from prior CT. Increased density in the right mid lung as described likely representing acute on chronic infiltrate. Electronically Signed   By: Alcide Clever M.D.   On: 08/10/2020 21:16    EKG: Independently reviewed.  Assessment/Plan Principal Problem:   Community acquired pneumonia of right lung Active Problems:   COPD (chronic obstructive pulmonary disease) (HCC)   Calcified pleural plaque due to asbestos exposure   Asbestosis (HCC)  Acute respiratory failure with hypoxia (HCC)   HTN (hypertension)    1. CAP of R lung - 1. PNA pathway 2. levaquin (anaphalaxis to cephalosporins) 3. Tele monitor and cont pulse ox 4. IVF: 1750cc bolus in ED + 125cc/hr for 20h due to hypotension + WBC (probably was septic but we dont technically have a documented fever on file to complete the 2/4 required SIRS) 5. COVID and flu neg 6. If pt not improving rapidly from PNA standpoint: consider CT chest given pulmonary history. 2. New O2 requirement - Secondary to #1 above 3. Chronic pulmonary disease - 1. Asbestosis and emphysema 2. Last CT scan of chest was Feb this year.  Asbestosis largely unchanged from 2020 (though is worse than 2017), a benign pulm nodule that's unchanged. 3. Cont home Nebs 4. HTN - 1. Holding losartan in setting of soft BPs in ED.  DVT prophylaxis: Lovenox Code Status: Full Family Communication: No family in room Disposition Plan: Home after PNA improved and O2 requirement resolved Consults  called: None Admission status: Admit to inpatient  Severity of Illness: The appropriate patient status for this patient is INPATIENT. Inpatient status is judged to be reasonable and necessary in order to provide the required intensity of service to ensure the patient's safety. The patient's presenting symptoms, physical exam findings, and initial radiographic and laboratory data in the context of their chronic comorbidities is felt to place them at high risk for further clinical deterioration. Furthermore, it is not anticipated that the patient will be medically stable for discharge from the hospital within 2 midnights of admission. The following factors support the patient status of inpatient.   IP status due to: 1) PNA with new O2 requirement 2) PNA with PSI score of 94   * I certify that at the point of admission it is my clinical judgment that the patient will require inpatient hospital care spanning beyond 2 midnights from the point of admission due to high intensity of service, high risk for further deterioration and high frequency of surveillance required.*    Bowe Sidor M. DO [facility]  How to contact the [facility] Attending or Consulting provider 7A - 7P or covering provider during after hours 7P -7A, for this patient?  1. Check the care team in [facility] and look for a) attending/consulting [facility] provider listed and b) the [facility] team listed 2. Log into [email or web address]  Amion Physician Scheduling and messaging for groups and whole hospitals  On call and physician scheduling software for group practices, residents, hospitalists and other medical providers for call, clinic, rotation and shift schedules. On[employer] is a hospital-wide system for scheduling doctors and paging doctors on call. EasyPlot is for scientific plotting and data analysis.  [email or web address]  and use [facility]'s universal password to access. If you do not have the password, please contact the hospital operator.   3. Locate the [facility] provider you are looking for under [facility] and page to a number that you can be directly reached. 4. If you still have difficulty reaching the provider, please page the [facility] (Director on Call) for the Hospitalists listed on amion for assistance.  08/11/2020, 5:20 AM

## 2020-08-11 NOTE — Evaluation (Signed)
Physical Therapy Evaluation Patient Details Name: Jonathan Harmon MRN: [id number] DOB: [birth year] Today's Date: 08/11/2020   History of Present Illness  Sander Remedios is a 87 y.o. male with medical history significant of COPD, asbestosis, HTN, he presented to the hospital with chief complaints of fever chills and confusion.  Was diagnosed with pneumonia admitted to the hospital.  Clinical Impression  PTA pt living with son and daughter in-law that work during the day in multistory home with 1 step to enter and bed and bath on main level. Per conversation with daughter in-law pt independent with ambulation, still driving in his familiar community to grocery store and church. Pt is limited in safe mobility by decreased cognition, repeating things multiple times and difficulty with way finding in unfamiliar environment, in presence of increased O2 demand, as well as some generalized weakness and decreased balance. Pt is supervision for bed mobility and transfers and min guard for straight line and balance challenged ambulation. Pt will not need any additional PT services or equipment at discharge, however PT will continue to follow acutely to progress high level balance activities.      Follow Up Recommendations No PT follow up;Supervision - Intermittent (for memory issues)    Equipment Recommendations  None recommended by PT       Precautions / Restrictions Precautions Precautions: Fall;Other (comment) Precaution Comments: monitor O2 Restrictions Weight Bearing Restrictions: No      Mobility  Bed Mobility Overal bed mobility: Needs Assistance Bed Mobility: Sit to Supine     Supine to sit: Supervision;HOB elevated Sit to supine: Supervision   General bed mobility comments: Supervision for line safety, mgmt    Transfers Overall transfer level: Needs assistance Equipment used: None Transfers: Sit to/from Stand Sit to Stand: Supervision         General transfer comment:  supervision for sit to stand without AD, min guard initially for dynamic tasks without AD for safety due to initial mild unsteadiness.  Ambulation/Gait Ambulation/Gait assistance: Min guard Gait Distance (Feet): 450 Feet Assistive device: None Gait Pattern/deviations: Step-through pattern;Drifts right/left Gait velocity: WFL Gait velocity interpretation: >2.62 ft/sec, indicative of community ambulatory General Gait Details: min guard for safety, initial drifting in hallwa, improved with distance and able to perform higher level balance activities with gait      Balance Overall balance assessment: Needs assistance Sitting-balance support: No upper extremity supported;Feet supported Sitting balance-Leahy Scale: Good     Standing balance support: No upper extremity supported;During functional activity Standing balance-Leahy Scale: Good               High level balance activites: Head turns;Other (comment);Direction changes High Level Balance Comments: pt able to complete head turns, with 1x LOB, able to self correct, better stability with looking up and down             Pertinent Vitals/Pain Pain Assessment: No/denies pain    Home Living Family/patient expects to be discharged to:: Private residence Living Arrangements: Children Available Help at Discharge: Family Type of Home: House Home Access: Stairs to enter   Secretary/administrator of Steps: 1 Home Layout: Two level;Able to live on main level with bedroom/bathroom Home Equipment: None      Prior Function Level of Independence: Independent         Comments: pt reports independence with ADLs and mobility. Family assists with meals, laundry, etc. Pt reports still driving short distances. Pt reports walking 1 mile every other day, used to enjoy golfing. Per daughter in law,  she assists with med mgmt.     Hand Dominance   Dominant Hand: Right    Extremity/Trunk Assessment   Upper Extremity  Assessment Upper Extremity Assessment: Defer to OT evaluation    Lower Extremity Assessment Lower Extremity Assessment: Overall WFL for tasks assessed    Cervical / Trunk Assessment Cervical / Trunk Assessment: Normal  Communication   Communication: No difficulties  Cognition Arousal/Alertness: Awake/alert Behavior During Therapy: WFL for tasks assessed/performed;Impulsive Overall Cognitive Status: No family/caregiver present to determine baseline cognitive functioning Area of Impairment: Orientation;Attention;Memory;Following commands;Safety/judgement;Awareness;Problem solving                 Orientation Level: Disoriented to;Time;Situation Current Attention Level: Selective Memory: Decreased short-term memory Following Commands: Follows one step commands with increased time;Follows multi-step commands with increased time Safety/Judgement: Decreased awareness of safety Awareness: Emergent Problem Solving: Difficulty sequencing;Requires verbal cues General Comments: Pt with some impulsivity, requiring cues for safety especially with lines. Decreased attention, problem solving. noted in chart question of underlying dementia. When asked orientation questions, such as date - pt looked at newspaper in room though date incorrect      General Comments General comments (skin integrity, edema, etc.): SaO2 at rest 92%O2 with ambulation dropped to 85%O2 with HR in 120s, recovered with deep breathing back to low 90s    Exercises Other Exercises Other Exercises: IS x 5 max inhalation 1250 mL   Assessment/Plan    PT Assessment Patient needs continued PT services  PT Problem List Decreased strength;Cardiopulmonary status limiting activity       PT Treatment Interventions Gait training;Stair training;Functional mobility training;Therapeutic activities;Therapeutic exercise;Balance training;Cognitive remediation;Patient/family education    PT Goals (Current goals can be found in the  Care Plan section)  Acute Rehab PT Goals Patient Stated Goal: go home tomorrow PT Goal Formulation: With patient Time For Goal Achievement: 08/25/20 Potential to Achieve Goals: Good    Frequency Min 2X/week    AM-PAC PT "6 Clicks" Mobility  Outcome Measure Help needed turning from your back to your side while in a flat bed without using bedrails?: None Help needed moving from lying on your back to sitting on the side of a flat bed without using bedrails?: None Help needed moving to and from a bed to a chair (including a wheelchair)?: None Help needed standing up from a chair using your arms (e.g., wheelchair or bedside chair)?: None Help needed to walk in hospital room?: None Help needed climbing 3-5 steps with a railing? : A Little 6 Click Score: 23    End of Session Equipment Utilized During Treatment: Gait belt Activity Tolerance: Patient tolerated treatment well Patient left: in bed;with call bell/phone within reach Nurse Communication: Mobility status;Other (comment) (O2 drop with ambulation) PT Visit Diagnosis: Unsteadiness on feet (R26.81);Muscle weakness (generalized) (M62.81)    Time: 1410-1440 PT Time Calculation (min) (ACUTE ONLY): 30 min   Charges:   PT Evaluation $PT Eval Moderate Complexity: 1 Mod PT Treatments $Gait Training: 8-22 mins        Nasario Czerniak B. Beverely Risen PT, DPT Acute Rehabilitation Services Pager [phone] Office [phone]   Elon Alas Fleet 08/11/2020, 3:41 PM

## 2020-08-11 NOTE — Progress Notes (Signed)
Lower extremity venous has been completed.   Preliminary results in CV Proc.   Blanch Media 08/11/2020 1:50 PM

## 2020-08-12 ENCOUNTER — Other Ambulatory Visit (HOSPITAL_COMMUNITY): Payer: Self-pay

## 2020-08-12 LAB — CBC WITH DIFFERENTIAL/PLATELET
Abs Immature Granulocytes: 0.08 10*3/uL — ABNORMAL HIGH (ref 0.00–0.07)
Basophils Absolute: 0 10*3/uL (ref 0.0–0.1)
Basophils Relative: 0 %
Eosinophils Absolute: 0 10*3/uL (ref 0.0–0.5)
Eosinophils Relative: 0 %
HCT: 33.8 % — ABNORMAL LOW (ref 39.0–52.0)
Hemoglobin: 11.3 g/dL — ABNORMAL LOW (ref 13.0–17.0)
Immature Granulocytes: 1 %
Lymphocytes Relative: 7 %
Lymphs Abs: 1.1 10*3/uL (ref 0.7–4.0)
MCH: 31.2 pg (ref 26.0–34.0)
MCHC: 33.4 g/dL (ref 30.0–36.0)
MCV: 93.4 fL (ref 80.0–100.0)
Monocytes Absolute: 0.9 10*3/uL (ref 0.1–1.0)
Monocytes Relative: 6 %
Neutro Abs: 14.2 10*3/uL — ABNORMAL HIGH (ref 1.7–7.7)
Neutrophils Relative %: 86 %
Platelets: 205 10*3/uL (ref 150–400)
RBC: 3.62 MIL/uL — ABNORMAL LOW (ref 4.22–5.81)
RDW: 13.2 % (ref 11.5–15.5)
WBC: 16.4 10*3/uL — ABNORMAL HIGH (ref 4.0–10.5)
nRBC: 0 % (ref 0.0–0.2)

## 2020-08-12 LAB — COMPREHENSIVE METABOLIC PANEL
ALT: 11 U/L (ref 0–44)
AST: 24 U/L (ref 15–41)
Albumin: 2.6 g/dL — ABNORMAL LOW (ref 3.5–5.0)
Alkaline Phosphatase: 81 U/L (ref 38–126)
Anion gap: 7 (ref 5–15)
BUN: 18 mg/dL (ref 8–23)
CO2: 27 mmol/L (ref 22–32)
Calcium: 8.6 mg/dL — ABNORMAL LOW (ref 8.9–10.3)
Chloride: 99 mmol/L (ref 98–111)
Creatinine, Ser: 1.17 mg/dL (ref 0.61–1.24)
GFR, Estimated: >60 mL/min (ref >60)
Glucose, Bld: 94 mg/dL (ref 70–99)
Potassium: 4.1 mmol/L (ref 3.5–5.1)
Sodium: 133 mmol/L — ABNORMAL LOW (ref 135–145)
Total Bilirubin: 0.6 mg/dL (ref 0.3–1.2)
Total Protein: 6.1 g/dL — ABNORMAL LOW (ref 6.5–8.1)

## 2020-08-12 LAB — URINE CULTURE: Culture: NO GROWTH

## 2020-08-12 LAB — PROCALCITONIN: Procalcitonin: 10.67 ng/mL

## 2020-08-12 LAB — MAGNESIUM: Magnesium: 1.8 mg/dL (ref 1.7–2.4)

## 2020-08-12 LAB — C-REACTIVE PROTEIN: CRP: 23.9 mg/dL — ABNORMAL HIGH (ref <1.0)

## 2020-08-12 LAB — BRAIN NATRIURETIC PEPTIDE: B Natriuretic Peptide: 207.3 pg/mL — ABNORMAL HIGH (ref 0.0–100.0)

## 2020-08-12 MED ORDER — HALOPERIDOL LACTATE 5 MG/ML IJ SOLN
1.0000 mg | Freq: Four times a day (QID) | INTRAMUSCULAR | Status: DC | PRN
  Administered 2020-08-12: 1 mg via INTRAMUSCULAR
  Filled 2020-08-12: qty 1

## 2020-08-12 MED ORDER — LEVOFLOXACIN 250 MG PO TABS
250.0000 mg | ORAL_TABLET | Freq: Every day | ORAL | 0 refills | Status: DC
  Filled 2020-08-12: qty 3, 3d supply, fill #0

## 2020-08-12 MED ORDER — HALOPERIDOL LACTATE 5 MG/ML IJ SOLN: 1.0000 mg | Freq: Once | INTRAMUSCULAR | Status: DC

## 2020-08-12 NOTE — Progress Notes (Signed)
SATURATION QUALIFICATIONS: (This note is used to comply with regulatory documentation for home oxygen)  Patient Saturations on Room Air at Rest = 90%  Patient Saturations on Room Air while Ambulating = 86%  Patient Saturations on 3 Liters of oxygen while Ambulating = 93%   

## 2020-08-12 NOTE — Discharge Summary (Signed)
Physician Discharge Summary  Ivo Moga ZOX:[id number] DOB: April 02, 1934 DOA: 08/10/2020  PCP: Esperanza Richters, PA-C  Admit date: 08/10/2020 Discharge date: 08/12/2020  Admitted From: Home.  Disposition:  Home.   Recommendations for Outpatient Follow-up:  1. Follow up with PCP in 1-2 weeks 2. Please obtain BMP/CBC in one week 3.          Please follow up with Dr Delton Coombes and recommend getting repeat imaging of the chest to evaluate for resolution of the pneumonia.      Discharge Condition: stable.  CODE STATUS: FULL CODE.  Diet recommendation: Heart Healthy    Brief/Interim Summary:  Xavier Knorris a 87 y.o.malewith medical history significant ofCOPD, asbestosis, HTN, he presented to the hospital with chief complaints of fever chills and confusion.  Was diagnosed with pneumonia admitted to the hospital.   Discharge Diagnoses:  Principal Problem:   Community acquired pneumonia of right lung Active Problems:   COPD (chronic obstructive pulmonary disease) (HCC)   Calcified pleural plaque due to asbestos exposure   Asbestosis (HCC)   Acute respiratory failure with hypoxia (HCC)   HTN (hypertension)   Acute Hypoxic Resp. Failure due to CAP and possibly early sepsis - he appears to improve community-acquired pneumonia in the right middle lobe,  He has been placed on IV Levaquin with good response, has been hydrated, sepsis pathophysiology seems to have resolved.  pts ambulating oxygen levels around 86%. He was discharged on oral antibiotics to complete the course.  He was discharged on 3lit of [place] oxygen to keep sats greater than 90%on ambulation.  In view of his exposure to asbestosis and underlying copd, recommend getting repeat imaging after the completion of antibiotics to evaluate for resolution of pneumonia.    2.  Toxic encephalopathy.  Question underlying mild dementia.  Supportive care, minimize narcotics and benzodiazepines.    3.  Dyslipidemia.  On statin  continue.  4.  History of COPD/asbestosis.  Supportive care.  5.  Elevated D-dimer.  Likely due to combination of dehydration and infection. Venous duplex is negative for DVT.   6.  Hypomagnesemia..  Replaced.  Discharge Instructions  Discharge Instructions    Diet - low sodium heart healthy   Complete by: As directed    Discharge instructions   Complete by: As directed    Please follow up with Dr Delton Coombes and get repeat imaging to evaluate for resolution of the pneumonia.   Increase activity slowly   Complete by: As directed      Allergies as of 08/12/2020      Reactions   Penicillin G Swelling   Penicillins Rash   Reported to family member - occurred when he was in his 87s       Medication List    STOP taking these medications   losartan 25 MG tablet Commonly known as: COZAAR     TAKE these medications   albuterol 108 (90 Base) MCG/ACT inhaler Commonly known as: VENTOLIN HFA INHALE 2 PUFFS BY MOUTH INTO THE LUNGS EVERY 6 (SIX) HOURS AS NEEDED FOR WHEEZING OR SHORTNESS OF BREATH. What changed:   how much to take  how to take this  when to take this  reasons to take this   atorvastatin 40 MG tablet Commonly known as: LIPITOR TAKE 1 TABLET BY MOUTH ONCE DAILY What changed: how much to take   levofloxacin 250 MG tablet Commonly known as: LEVAQUIN Take 1 tablet (250 mg total) by mouth daily at 6 PM.   Stiolto Respimat 2.5-2.5  MCG/ACT Aers Generic drug: Tiotropium Bromide-Olodaterol INHALE 2 PUFFS BY MOUTH INTO THE LUNGS DAILY. What changed:   how much to take  how to take this  when to take this  reasons to take this            Durable Medical Equipment  (From admission, onward)         Start     Ordered   08/12/20 1405  For home use only DME oxygen  Once       Question Answer Comment  Length of Need Lifetime   Mode or (Route) Nasal cannula   Liters per Minute 3   Frequency Continuous (stationary and portable oxygen unit needed)    Oxygen conserving device Yes   Oxygen delivery system Gas      08/12/20 1404          Follow-up Information    Saguier, Ramon Dredge, PA-C. Schedule an appointment as soon as possible for a visit in 1 week(s).   Specialties: Internal Medicine, Family Medicine Contact information: [address] [place] [address] [phone]        Leslye Peer, MD. Schedule an appointment as soon as possible for a visit in 1 week(s).   Specialty: Pulmonary Disease Why: please obtain imaging in 2 weeks to evalute for resolution of the pneumonia. Contact information: [address] [place] [address] [phone]              Allergies  Allergen Reactions  . Penicillin G Swelling  . Penicillins Rash    Reported to family member - occurred when he was in his 74s     Consultations:  None.    Procedures/Studies: DG Chest Port 1 View  Result Date: 08/11/2020 CLINICAL DATA:  Fever.  Shortness of breath. EXAM: PORTABLE CHEST 1 VIEW COMPARISON:  Chest x-ray 08/10/2020. CT chest 06/01/2020, 09/06/2018. FINDINGS: Mediastinum and hilar structures normal. Heart size stable. Persistent increased density right mid lung suggesting the possibility of pneumonia. No interim change. Persistent bilateral pleural-parenchymal thickening consistent with scarring. Persistent bilateral dense calcified pleural plaques. Findings consistent with prior asbestos exposure. No pneumothorax. IMPRESSION: 1. Persistent increased density right mid lung. Again pneumonia cannot be excluded. No interim change from 08/10/2020. 2. Persistent bilateral pleural-parenchymal thickening consistent with scarring. Prominent calcified bilateral pleural plaques again noted consistent with prior asbestos exposure. Electronically Signed   By: Maisie Fus  Register   On: 08/11/2020 07:48   DG Chest Port 1 View  Result Date: 08/10/2020 CLINICAL DATA:  Fever and shortness of breath EXAM: PORTABLE CHEST 1  VIEW COMPARISON:  06/01/2020 FINDINGS: Cardiac shadows within normal limits. Aortic calcifications are again seen. Diffuse calcified pleural plaques are again identified bilaterally. Some increased density is noted in the right mid lung laterally when compared with the prior CT examination suspicious for superimposed acute pneumonic infiltrate. No sizable effusion is seen. No bony abnormality is noted. IMPRESSION: Diffuse calcified pleural plaques stable from prior CT. Increased density in the right mid lung as described likely representing acute on chronic infiltrate. Electronically Signed   By: Alcide Clever M.D.   On: 08/10/2020 21:16   VAS [name] LOWER EXTREMITY VENOUS (DVT)  Result Date: 08/11/2020  Lower Venous DVT Study Indications: Rapidly rising D-dimer.  Comparison Study: NO PRIOR Performing Technologist: Blanch Media RVS  Examination Guidelines: A complete evaluation includes B-mode imaging, spectral Doppler, color Doppler, and power Doppler as needed of all accessible portions of each vessel. Bilateral testing  is considered an integral part of a complete examination. Limited examinations for reoccurring indications may be performed as noted. The reflux portion of the exam is performed with the patient in reverse Trendelenburg.  +---------+---------------+---------+-----------+----------+--------------+ RIGHT    CompressibilityPhasicitySpontaneityPropertiesThrombus Aging +---------+---------------+---------+-----------+----------+--------------+ CFV      Full           Yes      Yes                                 +---------+---------------+---------+-----------+----------+--------------+ SFJ      Full                                                        +---------+---------------+---------+-----------+----------+--------------+ FV Prox  Full                                                        +---------+---------------+---------+-----------+----------+--------------+ FV  Mid   Full                                                        +---------+---------------+---------+-----------+----------+--------------+ FV DistalFull                                                        +---------+---------------+---------+-----------+----------+--------------+ PFV      Full                                                        +---------+---------------+---------+-----------+----------+--------------+ POP      Full           Yes      Yes                                 +---------+---------------+---------+-----------+----------+--------------+ PTV      Full                                                        +---------+---------------+---------+-----------+----------+--------------+ PERO     Full                                                        +---------+---------------+---------+-----------+----------+--------------+   +---------+---------------+---------+-----------+----------+--------------+ LEFT     CompressibilityPhasicitySpontaneityPropertiesThrombus Aging +---------+---------------+---------+-----------+----------+--------------+ CFV  Full           Yes      Yes                                 +---------+---------------+---------+-----------+----------+--------------+ SFJ      Full                                                        +---------+---------------+---------+-----------+----------+--------------+ FV Prox  Full                                                        +---------+---------------+---------+-----------+----------+--------------+ FV Mid   Full                                                        +---------+---------------+---------+-----------+----------+--------------+ FV DistalFull                                                        +---------+---------------+---------+-----------+----------+--------------+ PFV      Full                                                         +---------+---------------+---------+-----------+----------+--------------+ POP      Full           Yes      Yes                                 +---------+---------------+---------+-----------+----------+--------------+ PTV      Full                                                        +---------+---------------+---------+-----------+----------+--------------+ PERO     Full                                                        +---------+---------------+---------+-----------+----------+--------------+     Summary: BILATERAL: - No evidence of deep vein thrombosis seen in the lower extremities, bilaterally. - No evidence of superficial venous thrombosis in the lower extremities, bilaterally. -No evidence of popliteal cyst, bilaterally.   *See table(s) above for measurements and observations. Electronically signed by Gretta Began MD on 08/11/2020 at 4:59:39 PM.    Final  Subjective: No new complaints.   Discharge Exam: Vitals:   08/11/20 2049 08/12/20 0445  BP: (!) 141/71 (!) 127/54  Pulse: 90 79  Resp: 17 18  Temp: 99.6 F (37.6 C) 98.6 F (37 C)  SpO2: 90% 99%   Vitals:   08/11/20 1221 08/11/20 1933 08/11/20 2049 08/12/20 0445  BP: (!) 102/53  (!) 141/71 (!) 127/54  Pulse: 76  90 79  Resp: Temp: (!) 97.3 F (36.3 C)  99.6 F (37.6 C) 98.6 F (37 C)  TempSrc: Oral  Oral Oral  SpO2: 91% 93% 90% 99%  Weight:      Height:        General: Pt is alert, awake, not in acute distress Cardiovascular: RRR, S1/S2 +, no rubs, no gallops Respiratory: CTA bilaterally, no wheezing, no rhonchi Abdominal: Soft, NT, ND, bowel sounds + Extremities: no edema, no cyanosis    The results of significant diagnostics from this hospitalization (including imaging, microbiology, ancillary and laboratory) are listed below for reference.     Microbiology: Recent Results (from the past 240 hour(s))  Resp Panel by RT-PCR (Flu A&B, Covid)  Nasopharyngeal Swab     Status: None   Collection Time: 08/10/20  9:27 PM   Specimen: Nasopharyngeal Swab; Nasopharyngeal(NP) swabs in vial transport medium  Result Value Ref Range Status   SARS Coronavirus 2 by RT PCR NEGATIVE NEGATIVE Final    Comment: (NOTE) SARS-CoV-2 target nucleic acids are NOT DETECTED.  The SARS-CoV-2 RNA is generally detectable in upper respiratory specimens during the acute phase of infection. The lowest concentration of SARS-CoV-2 viral copies this assay can detect is 138 copies/mL. A negative result does not preclude SARS-Cov-2 infection and should not be used as the sole basis for treatment or other patient management decisions. A negative result may occur with  improper specimen collection/handling, submission of specimen other than nasopharyngeal swab, presence of viral mutation(s) within the areas targeted by this assay, and inadequate number of viral copies(<138 copies/mL). A negative result must be combined with clinical observations, patient history, and epidemiological information. The expected result is Negative.  Fact Sheet for Patients:  BloggerCourse.com  Fact Sheet for Healthcare Providers:  SeriousBroker.it  This test is no t yet approved or cleared by the [place] FDA and  has been authorized for detection and/or diagnosis of SARS-CoV-2 by FDA under an Emergency Use Authorization (EUA). This EUA will remain  in effect (meaning this test can be used) for the duration of the COVID-19 declaration under Section 564(b)(1) of the Act, 21 U.S.C.section 360bbb-3(b)(1), unless the authorization is terminated  or revoked sooner.       Influenza A by PCR NEGATIVE NEGATIVE Final   Influenza B by PCR NEGATIVE NEGATIVE Final    Comment: (NOTE) The Xpert Xpress SARS-CoV-2/FLU/RSV plus assay is intended as an aid in the diagnosis of influenza from Nasopharyngeal swab specimens and should not be  used as a sole basis for treatment. Nasal washings and aspirates are unacceptable for Xpert Xpress SARS-CoV-2/FLU/RSV testing.  Fact Sheet for Patients: BloggerCourse.com  Fact Sheet for Healthcare Providers: SeriousBroker.it  This test is not yet approved or cleared by the [place] FDA and has been authorized for detection and/or diagnosis of SARS-CoV-2 by FDA under an Emergency Use Authorization (EUA). This EUA will remain in effect (meaning this test can be used) for the duration of the COVID-19 declaration under Section 564(b)(1) of the Act, 21 U.S.C. section 360bbb-3(b)(1), unless the authorization is terminated  or revoked.  Performed at [facility], [address] [place], [place] [address]   Blood culture (routine x 2)     Status: None (Preliminary result)   Collection Time: 08/10/20  9:32 PM   Specimen: BLOOD  Result Value Ref Range Status   Specimen Description   Final    BLOOD Blood Culture results may not be optimal due to an excessive volume of blood received in culture bottles Performed at [facility], [address] [place], [place] [address]    Special Requests   Final    BOTTLES DRAWN AEROBIC AND ANAEROBIC LEFT ANTECUBITAL Performed at [facility], [address] [place], [place] [address]    Culture   Final    NO GROWTH < 24 HOURS Performed at [facility], [address], [place], [place] [address]    Report Status PENDING  Incomplete  Blood culture (routine x 2)     Status: None (Preliminary result)   Collection Time: 08/10/20  9:45 PM   Specimen: BLOOD  Result Value Ref Range Status   Specimen Description   Final    BLOOD Blood Culture adequate volume Performed at [facility], [address] [place], [place] [address]    Special Requests   Final    BOTTLES DRAWN AEROBIC AND ANAEROBIC BLOOD RIGHT FOREARM Performed at [facility],  [address] [place], [place] [address]    Culture   Final    NO GROWTH < 24 HOURS Performed at [facility], [address], [place], [place] [address]    Report Status PENDING  Incomplete  Urine Culture     Status: None   Collection Time: 08/10/20 11:39 PM   Specimen: Urine, Random  Result Value Ref Range Status   Specimen Description   Final    URINE, RANDOM Performed at [facility], [address] [place], [place] [address]    Special Requests   Final    NONE Performed at [facility], [address] [place], [place] [address]    Culture   Final    NO GROWTH Performed at [facility], [address] [place], [place] [address]    Report Status 08/12/2020 FINAL  Final  MRSA PCR Screening     Status: None   Collection Time: 08/11/20  9:02 AM   Specimen: Nasopharyngeal  Result Value Ref Range Status   MRSA by PCR NEGATIVE NEGATIVE Final    Comment:        The GeneXpert MRSA Assay (FDA approved for NASAL specimens only), is one component of a comprehensive MRSA colonization surveillance program. It is not intended to diagnose MRSA infection nor to guide or monitor treatment for MRSA infections. Performed at [facility], [address] [place], [place] [address]      Labs: BNP (last 3 results) Recent Labs    08/11/20 0725 08/12/20 0152  BNP 187.2* 207.3*   Basic Metabolic Panel: Recent Labs  Lab 08/10/20 2132 08/11/20 0725 08/12/20 0152  NA 135 134* 133*  K 4.5 4.3 4.1  CL 97* 100 99  CO2 28 28 27   GLUCOSE 171* 109* 94  BUN 25* 19 18  CREATININE 1.34* 1.18 1.17  CALCIUM 8.9 8.5* 8.6*  MG  --  1.4* 1.8   Liver Function Tests: Recent Labs  Lab 08/10/20 2132 08/12/20 0152  AST 30 24  ALT 14 11  ALKPHOS 85 81  BILITOT 0.8 0.6  PROT 7.8 6.1*  ALBUMIN 3.5 2.6*   No results for input(s): LIPASE, AMYLASE in the last 168 hours. No results for input(s): AMMONIA in the last 168 hours. CBC: Recent  Labs  Lab 08/10/20 2132 08/11/20 0725 08/12/20 0152  WBC 20.8* 16.8* 16.4*  NEUTROABS 19.6*  --  14.2*  HGB 13.6 11.4* 11.3*  HCT 41.5 34.6* 33.8*  MCV 94.1 93.8 93.4  PLT 244 231 205   Cardiac Enzymes: No results for input(s): CKTOTAL, CKMB, CKMBINDEX, TROPONINI in the last 168 hours. BNP: Invalid input(s): POCBNP CBG: No results for input(s): GLUCAP in the last 168 hours. D-Dimer Recent Labs    08/11/20 0725  DDIMER 2.55*   Hgb A1c No results for input(s): HGBA1C in the last 72 hours. Lipid Profile No results for input(s): CHOL, HDL, LDLCALC, TRIG, CHOLHDL, LDLDIRECT in the last 72 hours. Thyroid function studies No results for input(s): TSH, T4TOTAL, T3FREE, THYROIDAB in the last 72 hours.  Invalid input(s): FREET3 Anemia work up No results for input(s): VITAMINB12, FOLATE, FERRITIN, TIBC, IRON, RETICCTPCT in the last 72 hours. Urinalysis    Component Value Date/Time   COLORURINE YELLOW 08/10/2020 2127   APPEARANCEUR CLEAR 08/10/2020 2127   LABSPEC 1.020 08/10/2020 2127   PHURINE 5.5 08/10/2020 2127   GLUCOSEU NEGATIVE 08/10/2020 2127   HGBUR MODERATE (A) 08/10/2020 2127   BILIRUBINUR NEGATIVE 08/10/2020 2127   KETONESUR NEGATIVE 08/10/2020 2127   PROTEINUR NEGATIVE 08/10/2020 2127   NITRITE NEGATIVE 08/10/2020 2127   LEUKOCYTESUR NEGATIVE 08/10/2020 2127   Sepsis Labs Invalid input(s): PROCALCITONIN,  WBC,  LACTICIDVEN Microbiology Recent Results (from the past 240 hour(s))  Resp Panel by RT-PCR (Flu A&B, Covid) Nasopharyngeal Swab     Status: None   Collection Time: 08/10/20  9:27 PM   Specimen: Nasopharyngeal Swab; Nasopharyngeal(NP) swabs in vial transport medium  Result Value Ref Range Status   SARS Coronavirus 2 by RT PCR NEGATIVE NEGATIVE Final    Comment: (NOTE) SARS-CoV-2 target nucleic acids are NOT DETECTED.  The SARS-CoV-2 RNA is generally detectable in upper respiratory specimens during the acute phase of infection. The  lowest concentration of SARS-CoV-2 viral copies this assay can detect is 138 copies/mL. A negative result does not preclude SARS-Cov-2 infection and should not be used as the sole basis for treatment or other patient management decisions. A negative result may occur with  improper specimen collection/handling, submission of specimen other than nasopharyngeal swab, presence of viral mutation(s) within the areas targeted by this assay, and inadequate number of viral copies(<138 copies/mL). A negative result must be combined with clinical observations, patient history, and epidemiological information. The expected result is Negative.  Fact Sheet for Patients:  BloggerCourse.comhttps://www.fda.gov/media/152166/download  Fact Sheet for Healthcare Providers:  SeriousBroker.ithttps://www.fda.gov/media/152162/download  This test is no t yet approved or cleared by the [place] FDA and  has been authorized for detection and/or diagnosis of SARS-CoV-2 by FDA under an Emergency Use Authorization (EUA). This EUA will remain  in effect (meaning this test can be used) for the duration of the COVID-19 declaration under Section 564(b)(1) of the Act, 21 U.S.C.section 360bbb-3(b)(1), unless the authorization is terminated  or revoked sooner.       Influenza A by PCR NEGATIVE NEGATIVE Final   Influenza B by PCR NEGATIVE NEGATIVE Final    Comment: (NOTE) The Xpert Xpress SARS-CoV-2/FLU/RSV plus assay is intended as an aid in the diagnosis of influenza from  Nasopharyngeal swab specimens and should not be used as a sole basis for treatment. Nasal washings and aspirates are unacceptable for Xpert Xpress SARS-CoV-2/FLU/RSV testing.  Fact Sheet for Patients: BloggerCourse.com  Fact Sheet for Healthcare Providers: SeriousBroker.it  This test is not yet approved or cleared by the [place] FDA and has been authorized for detection and/or diagnosis of SARS-CoV-2 by FDA under  an Emergency Use Authorization (EUA). This EUA will remain in effect (meaning this test can be used) for the duration of the COVID-19 declaration under Section 564(b)(1) of the Act, 21 U.S.C. section 360bbb-3(b)(1), unless the authorization is terminated or revoked.  Performed at [facility], [address] [place], [place] [address]   Blood culture (routine x 2)     Status: None (Preliminary result)   Collection Time: 08/10/20  9:32 PM   Specimen: BLOOD  Result Value Ref Range Status   Specimen Description   Final    BLOOD Blood Culture results may not be optimal due to an excessive volume of blood received in culture bottles Performed at [facility], [address] [place], [place] [address]    Special Requests   Final    BOTTLES DRAWN AEROBIC AND ANAEROBIC LEFT ANTECUBITAL Performed at [facility], [address] [place], [place] [address]    Culture   Final    NO GROWTH < 24 HOURS Performed at [facility], [address] [place], [place] [address]    Report Status PENDING  Incomplete  Blood culture (routine x 2)     Status: None (Preliminary result)   Collection Time: 08/10/20  9:45 PM   Specimen: BLOOD  Result Value Ref Range Status   Specimen Description   Final    BLOOD Blood Culture adequate volume Performed at [facility], [address] [place], [place] [address]    Special Requests   Final    BOTTLES DRAWN AEROBIC AND ANAEROBIC BLOOD RIGHT FOREARM Performed at [facility], [address] [place], [place] [address]    Culture   Final    NO GROWTH < 24 HOURS Performed at [facility], [address] [place], [place] [address]    Report Status PENDING  Incomplete  Urine Culture     Status: None   Collection Time: 08/10/20 11:39 PM   Specimen: Urine, Random  Result Value Ref Range Status   Specimen Description   Final    URINE, RANDOM Performed at [facility], [address] [place], [place] [address]    Special Requests   Final    NONE Performed at [facility], [address] [place], [place] [address]    Culture   Final    NO GROWTH Performed at [facility], [address], [place], [place] [address]    Report Status 08/12/2020 FINAL  Final  MRSA PCR Screening     Status: None   Collection Time: 08/11/20  9:02 AM   Specimen: Nasopharyngeal  Result Value Ref Range Status   MRSA by PCR NEGATIVE NEGATIVE Final    Comment:        The GeneXpert MRSA Assay (FDA approved for NASAL specimens only), is one component of a comprehensive MRSA colonization surveillance program. It is not intended to diagnose MRSA infection nor to guide or monitor treatment for MRSA infections. Performed at [facility],  [address] [place], [place] [address]      Time coordinating discharge: 36 minutes.   SIGNED:   Kathlen Mody, MD  [facility] Hospitalists 08/12/2020, 4:18 PM

## 2020-08-12 NOTE — Progress Notes (Signed)
Patient discharged at this time via wheelchair escorted by this RN with supplemental oxygen to [employer]. Daughter in law taking patient home at this time. Patient denies having any complaints, discharged in stable condition, no acute distress noted.

## 2020-08-12 NOTE — Progress Notes (Addendum)
Patient constantly leaving bed despite reorientation and staff attempting to be in room as much as possible. Patient has removed both peripheral IVs. Patient has been removing nasal cannula and pulse oximeter. 1mg  Haldol IM given.

## 2020-08-12 NOTE — Progress Notes (Signed)
Patient-reported Beta-Lactam Allergy Assessment  Specific drug that caused reaction: Penicillin Reaction(s) that occurred: rash  Antibiotics tolerated in the past: . Patient reported:  did not report any antibiotics . Historical data obtained from the EMR:  n/a  Information received from:  Family member   Due to reported unknown reaction, the following questions were asked about the Penicillin allergy:  How long after taking the drug did the reaction occur?  Unknown/ cannot recall  How long ago did the reaction occur?  Over 10 years ago (approximately 56-92 years old)  What was done to manage the reaction?  Unknown   Based on the above interview, it has been deemed appropriate for patient to be administered beta lactams as the risk for cross-reactivity to documented reaction is low.

## 2020-08-12 NOTE — Care Management (Signed)
Spoke with Jonathan Harmon at [facility] and they will be able to arrange home oxygen set up for this evening, RNCM spoke with patient's daughter-in-law in room who agreeable with transitional care plan. Attempted to contact floor nurse no answer.

## 2020-08-12 NOTE — Care Management (Signed)
ED RNCM received call from [facility] on 5W concerning patient being discharged needing oxygen set up.  RNCM reviewed record noted order and qualifying oxygen note but no TOC consult was placed to notify [facility] team.  ED CM contacted patient's family in room to discuss arranging home oxygen set up tonight.  RNCM will notify [facility] with this referral.

## 2020-08-13 ENCOUNTER — Telehealth: Payer: Self-pay

## 2020-08-13 NOTE — Telephone Encounter (Signed)
Transition Care Management Unsuccessful Follow-up Telephone Call  Date of discharge and from where:  08/12/20-[facility]  Attempts:  1st Attempt  Reason for unsuccessful TCM follow-up call:  No answer/busy

## 2020-08-14 ENCOUNTER — Telehealth: Payer: Self-pay

## 2020-08-14 NOTE — Telephone Encounter (Signed)
Transition Care Management Follow-up Telephone Call  Date of discharge and from where: 08/12/2020-[facility]  How have you been since you were released from the hospital? Very tired the first day home but perked up a little last night per son.  Any questions or concerns? No  Items Reviewed:  Did the pt receive and understand the discharge instructions provided? Yes   Medications obtained and verified? Yes   Other? Yes   Any new allergies since your discharge? No   Dietary orders reviewed? Yes  Do you have support at home? Yes   Home Care and Equipment/Supplies: Were home health services ordered? no If so, what is the name of the agency? n/a  Has the agency set up a time to come to the patient's home? not applicable Were any new equipment or medical supplies ordered?  Yes: oxygen What is the name of the medical supply agency? [facility] Were you able to get the supplies/equipment? yes Do you have any questions related to the use of the equipment or supplies? No  Functional Questionnaire: (I = Independent and D = Dependent) ADLs: I with asisstance  Bathing/Dressing- I  Meal Prep- D  Eating- I  Maintaining continence- I  Transferring/Ambulation- I  Managing Meds- D  Follow up appointments reviewed:   PCP Hospital f/u appt confirmed? Yes  Scheduled to see Esperanza Richters on 08/24/2020 @ 1;20.  Specialist Hospital f/u appt confirmed? No  Pulmonary to schedule  Are transportation arrangements needed? No   If their condition worsens, is the pt aware to call PCP or go to the Emergency Dept.? Yes  Was the patient provided with contact information for the PCP's office or ED? Yes  Was to pt encouraged to call back with questions or concerns? Yes

## 2020-08-16 LAB — CULTURE, BLOOD (ROUTINE X 2)
Culture: NO GROWTH
Culture: NO GROWTH
Specimen Description: ADEQUATE

## 2020-08-21 ENCOUNTER — Encounter: Payer: Self-pay | Admitting: Emergency Medicine

## 2020-08-21 ENCOUNTER — Ambulatory Visit (INDEPENDENT_AMBULATORY_CARE_PROVIDER_SITE_OTHER): Payer: Medicare Other | Admitting: Emergency Medicine

## 2020-08-21 ENCOUNTER — Other Ambulatory Visit: Payer: Self-pay

## 2020-08-21 DIAGNOSIS — J189 Pneumonia, unspecified organism: Secondary | ICD-10-CM

## 2020-08-21 DIAGNOSIS — J920 Pleural plaque with presence of asbestos: Secondary | ICD-10-CM | POA: Diagnosis not present

## 2020-08-21 DIAGNOSIS — J449 Chronic obstructive pulmonary disease, unspecified: Secondary | ICD-10-CM | POA: Diagnosis not present

## 2020-08-21 NOTE — Patient Instructions (Signed)
We will perform a walking oximetry today on room air. Our goal is to keep your oxygen saturations greater than 90% at all times.  This may mean that you need to be wearing your oxygen before getting up to exert yourself so that we can prevent a drop below 90%. Please continue your Stiolto 2 puffs once daily. Keep your albuterol available to use 2 puffs when needed for shortness of breath, chest tightness, wheezing. Follow Dr. Delton Coombes in 1 month with a chest x-ray on the same day.

## 2020-08-21 NOTE — Assessment & Plan Note (Signed)
Please continue your Stiolto 2 puffs once daily. Keep your albuterol available to use 2 puffs when needed for shortness of breath, chest tightness, wheezing. Follow Dr. Delton Coombes in 1 month

## 2020-08-21 NOTE — Assessment & Plan Note (Addendum)
Improving clinically but not back to his usual baseline.  He developed hypoxemia with the pneumonia.  He might benefit from pulmonary rehab going forward.  Plan to repeat his chest x-ray in a month to look for clearance

## 2020-08-21 NOTE — Assessment & Plan Note (Signed)
Most recent CT scan overall stable, there is been some progression of his UIP since 2017.  No new alarming nodules or other findings.  Continue surveillance

## 2020-08-21 NOTE — Progress Notes (Signed)
Subjective:    Patient ID: Jonathan Harmon, male    DOB: 08/06/1933, 87 y.o.   MRN: [id number]  HPI 87 year old former smoker (40 pack years) with COPD, emphysematous change by imaging.  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  He has been seen in our office by Dr DeDios, most recently by B Mack.  Was treated by phone for an acute exacerbation in April with prednisone and doxycycline.  Saw B Mack in May with increased dyspnea. He was on Spiriva, changed to Anoro and then ultimately to [facility] in May 2020 for increased dyspnea. He has apparently been   He is able to walk over a mile, does this every other day.   Repeat CT scan of his chest was done on 12/12/2018 which I reviewed.  This shows significant pleural involvement with scarring and calcifications including an unchanged elongated 12 mm pulmonary nodule at his right pulmonary apex.  He has some bibasilar fibrotic change with honeycomb formation, no apparent groundglass.  This is slightly increased when compared back to 11/2015  Pulmonary function testing 07/2016 reviewed by me, shows severe obstruction, FEV1 56% predicted, with hyperinflated total lung capacity.  ROV 08/21/20 --follow-up visit for 88 year old gentleman, former smoker with emphysema and COPD, occupational exposure to asbestos as a IT sales professional with findings consistent with asbestosis and pleural plaques, UIP changes on CT chest.  Last seen here by TP 05/27/2020, was continued on Stiolto and albuterol as needed - using rarely. He was admitted for community-acquired pneumonia earlier this month with hypoxic respiratory failure.  He was discharged on 3 L/min. He has completed his abx - levaquin. They have seen some SpO2 in the mid 80's. He remains lethargic, has some low appetite.   Most recent CT chest 06/01/2020 reviewed by me, showed extensive  pleural plaquing, severe emphysema, mild interstitial disease in a UIP pattern that was stable compared with 11/2018 but slightly worse compared with 2017.  There was a decrease in size of right upper lobe nodular opacity consistent with prior inflammation.  MDM: Reviewed hospital notes from 08/10/2020 admission  Review of Systems  Past Medical History:  Diagnosis Date  . Asbestos exposure    diminished lung compacity from asbestos exposure 30 years  . COPD (chronic obstructive pulmonary disease) (HCC)   . Hyperlipidemia   . Hypertension   . Pneumonia    x 5     Family History  Problem Relation Age of Onset  . Heart attack Father      Social History   Socioeconomic History  . Marital status: Widowed    Spouse name: Not on file  . Number of children: Not on file  . Years of education: Not on file  . Highest education level: Not on file  Occupational History  . Occupation: Retired IT sales professional  Tobacco Use  . Smoking status: Former Smoker    Packs/day: 1.00    Years: 40.00    Pack years: 40.00    Types: Cigarettes    Quit date: 04/25/2000    Years since quitting: 20.3  . Smokeless tobacco: Never Used  Substance and Sexual Activity  . Alcohol use: No    Comment: rarely  . Drug use: No  . Sexual activity: Not Currently  Other Topics Concern  . Not on file  Social History Narrative  . Not on file   Social Determinants of Health   Financial Resource  Strain: Not on file  Food Insecurity: Not on file  Transportation Needs: Not on file  Physical Activity: Not on file  Stress: Not on file  Social Connections: Not on file  Intimate Partner Violence: Not on file  From [place], worked as a International aid/development worker. Former Cabin crew.   Allergies  Allergen Reactions  . Penicillin G Swelling  . Penicillins Rash    Reported to family member - occurred when he was in his 62s      Outpatient Medications Prior to Visit  Medication Sig Dispense Refill  . albuterol  (VENTOLIN HFA) 108 (90 Base) MCG/ACT inhaler INHALE 2 PUFFS BY MOUTH INTO THE LUNGS EVERY 6 (SIX) HOURS AS NEEDED FOR WHEEZING OR SHORTNESS OF BREATH. (Patient taking differently: Inhale 2 puffs into the lungs every 6 (six) hours as needed for wheezing or shortness of breath.) 8.5 g 6  . atorvastatin (LIPITOR) 40 MG tablet TAKE 1 TABLET BY MOUTH ONCE DAILY (Patient taking differently: Take 40 mg by mouth daily.) 30 tablet 3  . levofloxacin (LEVAQUIN) 250 MG tablet Take 1 tablet (250 mg total) by mouth daily at 6 PM. 3 tablet 0  . Tiotropium Bromide-Olodaterol 2.5-2.5 MCG/ACT AERS INHALE 2 PUFFS BY MOUTH INTO THE LUNGS DAILY. (Patient not taking: Reported on 08/21/2020) 4 g 6   No facility-administered medications prior to visit.        Objective:   Physical Exam Vitals:   08/21/20 1031  BP: 128/78  Pulse: 93  Temp: 98 F (36.7 C)  TempSrc: Temporal  SpO2: 93%  Weight: 109 lb 3.2 oz (49.5 kg)  Height: 5\' 8"  (1.727 m)   Gen: Pleasant, thin, in no distress,  normal affect  ENT: No lesions,  mouth clear,  oropharynx clear, no postnasal drip  Neck: No JVD, no stridor  Lungs: No use of accessory muscles, no crackles or wheezing on normal respiration, he does wheeze on forced expiration  Cardiovascular: RRR, heart sounds normal, no murmur or gallops, no peripheral edema  Musculoskeletal: No deformities, no cyanosis or clubbing  Neuro: alert, awake, non focal  Skin: Warm, no lesions or rash      Assessment & Plan:  COPD (chronic obstructive pulmonary disease) (HCC) Please continue your Stiolto 2 puffs once daily. Keep your albuterol available to use 2 puffs when needed for shortness of breath, chest tightness, wheezing. Follow Dr. in 1 month  Community acquired pneumonia of right lung Improving clinically but not back to his usual baseline.  He developed hypoxemia with the pneumonia.  He might benefit from pulmonary rehab going forward.  Plan to repeat his chest x-ray in  a month to look for clearance  Calcified pleural plaque due to asbestos exposure Most recent CT scan overall stable, there is been some progression of his UIP since 2017.  No new alarming nodules or other findings.  Continue surveillance  2018, MD, PhD 08/21/2020, 10:55 AM [facility] [phone] or if no answer [phone]

## 2020-08-24 ENCOUNTER — Other Ambulatory Visit (HOSPITAL_BASED_OUTPATIENT_CLINIC_OR_DEPARTMENT_OTHER): Payer: Self-pay

## 2020-08-24 ENCOUNTER — Ambulatory Visit (INDEPENDENT_AMBULATORY_CARE_PROVIDER_SITE_OTHER): Payer: Medicare Other | Admitting: Medical

## 2020-08-24 ENCOUNTER — Other Ambulatory Visit: Payer: Self-pay

## 2020-08-24 ENCOUNTER — Encounter: Payer: Self-pay | Admitting: Medical

## 2020-08-24 VITALS — BP 123/73 | HR 101 | Temp 98.1°F | Wt 105.8 lb

## 2020-08-24 DIAGNOSIS — J189 Pneumonia, unspecified organism: Secondary | ICD-10-CM

## 2020-08-24 DIAGNOSIS — E871 Hypo-osmolality and hyponatremia: Secondary | ICD-10-CM | POA: Diagnosis not present

## 2020-08-24 DIAGNOSIS — D72829 Elevated white blood cell count, unspecified: Secondary | ICD-10-CM

## 2020-08-24 DIAGNOSIS — J449 Chronic obstructive pulmonary disease, unspecified: Secondary | ICD-10-CM | POA: Diagnosis not present

## 2020-08-24 DIAGNOSIS — R413 Other amnesia: Secondary | ICD-10-CM

## 2020-08-24 MED ORDER — ALBUTEROL SULFATE HFA 108 (90 BASE) MCG/ACT IN AERS
INHALATION_SPRAY | RESPIRATORY_TRACT | 11 refills | Status: AC
  Filled 2020-08-24: qty 8.5, 25d supply, fill #0
  Filled 2020-11-18: qty 8.5, 25d supply, fill #1

## 2020-08-24 NOTE — Progress Notes (Signed)
Subjective:    Patient ID: Jonathan Harmon, male    DOB: 23-Aug-1933, 87 y.o.   MRN: [id number]  HPI   Pt in for follow up for pneumonia.  Admit date: 08/10/2020 Discharge date: 08/12/2020  Admitted From: Home.  Disposition:  Home.   Pt stopped 20 years ago.   Recommendations for Outpatient Follow-up:  1. Follow up with PCP in 1-2 weeks 2. Please obtain BMP/CBC in one week 3.          Please follow up with Dr Delton Coombes and recommend getting repeat imaging of the chest to evaluate for resolution of the pneumonia.   Pt feels a lot better. No fever, no chills or sweats.  Pt has hx of copd and random cough. He still has random cough but not severe. No sob.  Pt did see pulmonologist. Pt will repeat chest in a month.   Brief/Interim Summary: Jonathan Harmon a 87 y.o.malewith medical history significant ofCOPD, asbestosis, HTN,he presented to the hospital with chief complaints of fever chills and confusion. Was diagnosed with pneumonia admitted to the hospital.   Discharge Diagnoses:  Principal Problem:   Community acquired pneumonia of right lung Active Problems:   COPD (chronic obstructive pulmonary disease) (HCC)   Calcified pleural plaque due to asbestos exposure   Asbestosis (HCC)   Acute respiratory failure with hypoxia (HCC)   HTN (hypertension)  Acute Hypoxic Resp. Failure due toCAPand possibly early sepsis -heappears to improve community-acquired pneumonia in the right middle lobe, He has been placed on IV Levaquin with good response, has been hydrated, sepsis pathophysiology seems to have resolved.  pts ambulating oxygen levels around 86%. He was discharged on oral antibiotics to complete the course.  He was discharged on 3lit of [place] oxygen to keep sats greater than 90%on ambulation.  In view of his exposure to asbestosis and underlying copd, recommend getting repeat imaging after the completion of antibiotics to evaluate for resolution of pneumonia.     2.Toxic encephalopathy. Question underlying mild dementia. Supportive care, minimize narcotics and benzodiazepines.   3.Dyslipidemia. On statin continue.  4.History of COPD/asbestosis. Supportive care.  5.Elevated D-dimer. Likely due to combination of dehydration and infection. Venous duplex is negative for DVT.   6.Hypomagnesemia.. Replaced.   Pt discharge on 3 liters oxygen to keep 02 sats a above 90%. Also advised to please continue your Stiolto 2 puffs once daily. Keep your albuterol available to use 2 puffs when needed for shortness of breath, chest tightness, wheezing.  Since hospitalization son states dad is very forgetful. Had some memory issues before but is worse now.  Review of Systems  Constitutional: Negative for chills, fatigue and fever.  Respiratory: Positive for cough. Negative for chest tightness and shortness of breath.        Random cough on and off still but not severe.  Cardiovascular: Negative for chest pain and palpitations.  Gastrointestinal: Negative for abdominal pain, constipation, diarrhea and nausea.  Genitourinary: Negative for dysuria, flank pain and frequency.  Musculoskeletal: Negative for back pain.  Skin: Negative for rash.  Neurological: Negative for dizziness, seizures, weakness and headaches.  Hematological: Negative for adenopathy. Does not bruise/bleed easily.  Psychiatric/Behavioral: Negative for behavioral problems and confusion.    Past Medical History:  Diagnosis Date  . Asbestos exposure    diminished lung compacity from asbestos exposure 30 years  . COPD (chronic obstructive pulmonary disease) (HCC)   . Hyperlipidemia   . Hypertension   . Pneumonia    x 5  Social History   Socioeconomic History  . Marital status: Widowed    Spouse name: Not on file  . Number of children: Not on file  . Years of education: Not on file  . Highest education level: Not on file  Occupational History  .  Occupation: Retired IT sales professional  Tobacco Use  . Smoking status: Former Smoker    Packs/day: 1.00    Years: 40.00    Pack years: 40.00    Types: Cigarettes    Quit date: 04/25/2000    Years since quitting: 20.3  . Smokeless tobacco: Never Used  Substance and Sexual Activity  . Alcohol use: No    Comment: rarely  . Drug use: No  . Sexual activity: Not Currently  Other Topics Concern  . Not on file  Social History Narrative  . Not on file   Social Determinants of Health   Financial Resource Strain: Not on file  Food Insecurity: Not on file  Transportation Needs: Not on file  Physical Activity: Not on file  Stress: Not on file  Social Connections: Not on file  Intimate Partner Violence: Not on file    Past Surgical History:  Procedure Laterality Date  . APPENDECTOMY    . SHOULDER SURGERY Bilateral    x 5 ( 2 on right ; 3 on left)    Family History  Problem Relation Age of Onset  . Heart attack Father     Allergies  Allergen Reactions  . Penicillin G Swelling  . Penicillins Rash    Reported to family member - occurred when he was in his 84s     Current Outpatient Medications on File Prior to Visit  Medication Sig Dispense Refill  . atorvastatin (LIPITOR) 40 MG tablet TAKE 1 TABLET BY MOUTH ONCE DAILY (Patient taking differently: Take 40 mg by mouth daily.) 30 tablet 3   No current facility-administered medications on file prior to visit.    BP 123/73   Pulse (!) 101   Temp 98.1 F (36.7 C)   Wt 105 lb 12.8 oz (48 kg)   SpO2 93% Comment: 94%  BMI 16.09 kg/m       Objective:   Physical Exam  General Mental Status- Alert. General Appearance- Not in acute distress.   Skin General: Color- Normal Color. Moisture- Normal Moisture.  Neck Carotid Arteries- Normal color. Moisture- Normal Moisture. No carotid bruits. No JVD.  Chest and Lung Exam Auscultation: Breath Sounds:-Normal.  Cardiovascular Auscultation:Rythm- Regular. Murmurs & Other Heart  Sounds:Auscultation of the heart reveals- No Murmurs.  Abdomen Inspection:-Inspeection Normal. Palpation/Percussion:Note:No mass. Palpation and Percussion of the abdomen reveal- Non Tender, Non Distended + BS, no rebound or guarding.   Neurologic Cranial Nerve exam:- CN III-XII intact(No nystagmus), symmetric smile. Strength:- 5/5 equal and symmetric strength both upper and lower extremities.      Assessment & Plan:  Recent hospitalized for pneumonia and overall describing clinically doing better in that regard.  Chest x-ray will be repeated in 1 month.  If signs symptoms worsen or change could repeat sooner.  History of COPD with O2 saturations less than 90% when hospitalized.  Now majority time O2 sats above 90% when using 2 to 3 L O2 at home.  Today O2 sat was 93 to 94% at rest on room air.  Refilled albuterol to use as needed.  Continue Stiolto inhaler.  Reminded patient not to try to walk daily as he is accustomed to.  Wait and get clearance from pulmonologist.  Recent worsening  memory.  We did Mini-Mental status exam today.  We will also go ahead and refer to neurologist.  Elevated WBC and mild anemia on lab review.  Repeat CBC today.  Metabolic panel showed low sodium repeat CMP today.  Follow-up in 6 weeks or as needed.  Esperanza Richters, PA-C   Time spent with patient today was 35  minutes which consisted of chart review, discussing diagnoses, work up, treatment reviewing mini mental status exam score  and documentation.

## 2020-08-24 NOTE — Patient Instructions (Signed)
Recent hospitalized for pneumonia and overall describing clinically doing better in that regard.  Chest x-ray will be repeated in 1 month.  If signs symptoms worsen or change could repeat sooner.  History of COPD with O2 saturations less than 90% when hospitalized.  Now majority time O2 sats above 90% when using 2 to 3 L O2 at home.  Today O2 sat was 93 to 94% at rest on room air.  Refilled albuterol to use as needed.  Continue Stiolto inhaler.  Reminded patient not to try to walk daily as he is accustomed to.  Wait and get clearance from pulmonologist.  Recent worsening memory.  We did Mini-Mental status exam today.  We will also go ahead and refer to neurologist.  Elevated WBC and mild anemia on lab review.  Repeat CBC today.  Metabolic panel showed low sodium repeat CMP today.  Follow-up in 6 weeks or as needed.

## 2020-08-25 LAB — CBC WITH DIFFERENTIAL/PLATELET
Basophils Absolute: 0.2 10*3/uL — ABNORMAL HIGH (ref 0.0–0.1)
Basophils Relative: 1.2 % (ref 0.0–3.0)
Eosinophils Absolute: 0.1 10*3/uL (ref 0.0–0.7)
Eosinophils Relative: 0.9 % (ref 0.0–5.0)
HCT: 35.4 % — ABNORMAL LOW (ref 39.0–52.0)
Hemoglobin: 11.8 g/dL — ABNORMAL LOW (ref 13.0–17.0)
Lymphocytes Relative: 8.8 % — ABNORMAL LOW (ref 12.0–46.0)
Lymphs Abs: 1.2 10*3/uL (ref 0.7–4.0)
MCHC: 33.4 g/dL (ref 30.0–36.0)
MCV: 92.8 fl (ref 78.0–100.0)
Monocytes Absolute: 1.1 10*3/uL — ABNORMAL HIGH (ref 0.1–1.0)
Monocytes Relative: 7.9 % (ref 3.0–12.0)
Neutro Abs: 11.3 10*3/uL — ABNORMAL HIGH (ref 1.4–7.7)
Neutrophils Relative %: 81.2 % — ABNORMAL HIGH (ref 43.0–77.0)
Platelets: 506 10*3/uL — ABNORMAL HIGH (ref 150.0–400.0)
RBC: 3.82 Mil/uL — ABNORMAL LOW (ref 4.22–5.81)
RDW: 13.8 % (ref 11.5–15.5)
WBC: 13.9 10*3/uL — ABNORMAL HIGH (ref 4.0–10.5)

## 2020-08-25 LAB — COMPREHENSIVE METABOLIC PANEL
ALT: 15 U/L (ref 0–53)
AST: 17 U/L (ref 0–37)
Albumin: 3.3 g/dL — ABNORMAL LOW (ref 3.5–5.2)
Alkaline Phosphatase: 92 U/L (ref 39–117)
BUN: 23 mg/dL (ref 6–23)
CO2: 29 mEq/L (ref 19–32)
Calcium: 9.2 mg/dL (ref 8.4–10.5)
Chloride: 98 mEq/L (ref 96–112)
Creatinine, Ser: 1.21 mg/dL (ref 0.40–1.50)
GFR: 54.94 mL/min — ABNORMAL LOW (ref >60.00)
Glucose, Bld: 100 mg/dL — ABNORMAL HIGH (ref 70–99)
Potassium: 5.1 mEq/L (ref 3.5–5.1)
Sodium: 136 mEq/L (ref 135–145)
Total Bilirubin: 0.5 mg/dL (ref 0.2–1.2)
Total Protein: 7.5 g/dL (ref 6.0–8.3)

## 2020-09-09 ENCOUNTER — Telehealth: Payer: Self-pay | Admitting: Medical

## 2020-09-09 NOTE — Telephone Encounter (Signed)
I received certificate of medical necessity from for oxygen. Complicated form. Pt sees pulmonologist on 09/20/2020 per last specialist note. I don't see comments about 02 use.  Will you ask pt son to get opinion from Dr Delton Coombes on his specific o2 needs. If son could stop by our office and take form to specialist.  Dr. Delton Coombes might not think he needs?  Placing form on your desk.

## 2020-09-10 ENCOUNTER — Ambulatory Visit: Payer: Medicare Other | Admitting: Emergency Medicine

## 2020-09-10 ENCOUNTER — Telehealth: Payer: Self-pay | Admitting: *Deleted

## 2020-09-10 NOTE — Telephone Encounter (Signed)
Caller Name Jrake Rodriquez Caller Phone Number [phone] Patient Name Jonathan Harmon Patient DOB April 07, 1934 Call Type Message Only Information Provided Reason for Call Request to Schedule Office Appointment Initial Comment Caller states his dad received a referral and would like to schedule an appt.

## 2020-09-10 NOTE — Telephone Encounter (Signed)
Will forward to Dr.Byrum's office for help.

## 2020-09-10 NOTE — Telephone Encounter (Signed)
Left message on sons machine with the number to [facility] [phone]).

## 2020-09-10 NOTE — Telephone Encounter (Signed)
We will plan to assess for home O2 needs at the 5/29 visit

## 2020-09-11 NOTE — Telephone Encounter (Signed)
o2 orders completed and faxed with confirmation.

## 2020-09-13 ENCOUNTER — Telehealth: Payer: Self-pay | Admitting: Medical

## 2020-09-13 NOTE — Telephone Encounter (Signed)
Neurology office wanted to know pt mental status score  on day of visit. Did you put that in epic or was it scanned to media.

## 2020-09-13 NOTE — Patient Instructions (Addendum)
It was a pleasure to see you today at our office.   Recommendations:    Neurocognitive evaluation at our office  MRI of the brain, the office will call you to arrange you appointment  Check B12 and TSH at the lab   We will start donepezil (Aricept) 5mg  daily (half tablet)  for 2 weeks then increase the dose to 10mg  daily.  Side effects include nausea, vomiting, diarrhea, vivid dreams, and muscle cramps.    Will follow up after results become available.   RECOMMENDATIONS FOR ALL PATIENTS WITH MEMORY PROBLEMS: 1. Continue to exercise (Recommend 30 minutes of walking everyday, or 3 hours every week) 2. Increase social interactions - continue going to [place] and enjoy social gatherings with friends and family 3. Eat healthy, avoid fried foods and eat more fruits and vegetables 4. Maintain adequate blood pressure, blood sugar, and blood cholesterol level. Reducing the risk of stroke and cardiovascular disease also helps promoting better memory. 5. Avoid stressful situations. Live a simple life and avoid aggravations. Organize your time and prepare for the next day in anticipation. 6. Sleep well, avoid any interruptions of sleep and avoid any distractions in the bedroom that may interfere with adequate sleep quality 7. Avoid sugar, avoid sweets as there is a strong link between excessive sugar intake, diabetes, and cognitive impairment We discussed the Mediterranean diet, which has been shown to help patients reduce the risk of progressive memory disorders and reduces cardiovascular risk. This includes eating fish, eat fruits and green leafy vegetables, nuts like almonds and hazelnuts, walnuts, and also use olive oil. Avoid fast foods and fried foods as much as possible. Avoid sweets and sugar as sugar use has been linked to worsening of memory function.  There is always a concern of gradual progression of memory problems. If this is the case, then we may need to adjust level of care according  to patient needs. Support, both to the patient and caregiver, should then be put into place.      You have been referred for a neuropsychological evaluation (i.e., evaluation of memory and thinking abilities). Please bring someone with you to this appointment if possible, as it is helpful for the doctor to hear from both you and another adult who knows you well. Please bring eyeglasses and hearing aids if you wear them.    The evaluation will take approximately 3 hours and has two parts:   . The first part is a clinical interview with the neuropsychologist (Dr. or Dr. Caspe). During the interview, the neuropsychologist will speak with you and the individual you brought to the appointment.    . The second part of the evaluation is testing with the doctor's technician Milbert Coulter or Roseanne Reno). During the testing, the technician will ask you to remember different types of material, solve problems, and answer some questionnaires. Your family member will not be present for this portion of the evaluation.   Please note: We must reserve several hours of the neuropsychologist's time and the psychometrician's time for your evaluation appointment. As such, there is a No-Show fee of $100. If you are unable to attend any of your appointments, please contact our office as soon as possible to reschedule.    FALL PRECAUTIONS: Be cautious when walking. Scan the area for obstacles that may increase the risk of trips and falls. When getting up in the mornings, sit up at the edge of the bed for a few minutes before getting out of bed. Consider elevating the  bed at the head end to avoid drop of blood pressure when getting up. Walk always in a well-lit room (use night lights in the walls). Avoid area rugs or power cords from appliances in the middle of the walkways. Use a walker or a cane if necessary and consider physical therapy for balance exercise. Get your eyesight checked regularly.  FINANCIAL OVERSIGHT: Supervision,  especially oversight when making financial decisions or transactions is also recommended.  HOME SAFETY: Consider the safety of the kitchen when operating appliances like stoves, microwave oven, and blender. Consider having supervision and share cooking responsibilities until no longer able to participate in those. Accidents with firearms and other hazards in the house should be identified and addressed as well.   ABILITY TO BE LEFT ALONE: If patient is unable to contact 911 operator, consider using LifeLine, or when the need is there, arrange for someone to stay with patients. Smoking is a fire hazard, consider supervision or cessation. Risk of wandering should be assessed by caregiver and if detected at any point, supervision and safe proof recommendations should be instituted.  MEDICATION SUPERVISION: Inability to self-administer medication needs to be constantly addressed. Implement a mechanism to ensure safe administration of the medications.   DRIVING: Regarding driving, in patients with progressive memory problems, driving will be impaired. We advise to have someone else do the driving if trouble finding directions or if minor accidents are reported. Independent driving assessment is available to determine safety of driving.   If you are interested in the driving assessment, you can contact the following:  [employer] in [place] [phone]  [facility] [phone]  [facility] [phone] or [phone]    Mediterranean Diet A Mediterranean diet refers to food and lifestyle choices that are based on the traditions of countries located on the [employer]. This way of eating has been shown to help prevent certain conditions and improve outcomes for people who have chronic diseases, like kidney disease and heart disease. What are tips for following this plan? Lifestyle   Cook and eat meals together  with your family, when possible.  Drink enough fluid to keep your urine clear or pale yellow.  Be physically active every day. This includes:  Aerobic exercise like running or swimming.  Leisure activities like gardening, walking, or housework.  Get 7-8 hours of sleep each night.  If recommended by your health care provider, drink red wine in moderation. This means 1 glass a day for nonpregnant women and 2 glasses a day for men. A glass of wine equals 5 oz (150 mL). Reading food labels   Check the serving size of packaged foods. For foods such as rice and pasta, the serving size refers to the amount of cooked product, not dry.  Check the total fat in packaged foods. Avoid foods that have saturated fat or trans fats.  Check the ingredients list for added sugars, such as corn syrup. Shopping   At the grocery store, buy most of your food from the areas near the walls of the store. This includes:  Fresh fruits and vegetables (produce).  Grains, beans, nuts, and seeds. Some of these may be available in unpackaged forms or large amounts (in bulk).  Fresh seafood.  Poultry and eggs.  Low-fat dairy products.  Buy whole ingredients instead of prepackaged foods.  Buy fresh fruits and vegetables in-season from local farmers markets.  Buy frozen fruits and vegetables in resealable bags.  If you do  not have access to quality fresh seafood, buy precooked frozen shrimp or canned fish, such as tuna, salmon, or sardines.  Buy small amounts of raw or cooked vegetables, salads, or olives from the deli or salad bar at your store.  Stock your pantry so you always have certain foods on hand, such as olive oil, canned tuna, canned tomatoes, rice, pasta, and beans. Cooking   Cook foods with extra-virgin olive oil instead of using butter or other vegetable oils.  Have meat as a side dish, and have vegetables or grains as your main dish. This means having meat in small portions or adding small  amounts of meat to foods like pasta or stew.  Use beans or vegetables instead of meat in common dishes like chili or lasagna.  Experiment with different cooking methods. Try roasting or broiling vegetables instead of steaming or sauteing them.  Add frozen vegetables to soups, stews, pasta, or rice.  Add nuts or seeds for added healthy fat at each meal. You can add these to yogurt, salads, or vegetable dishes.  Marinate fish or vegetables using olive oil, lemon juice, garlic, and fresh herbs. Meal planning   Plan to eat 1 vegetarian meal one day each week. Try to work up to 2 vegetarian meals, if possible.  Eat seafood 2 or more times a week.  Have healthy snacks readily available, such as:  Vegetable sticks with hummus.  Greek yogurt.  Fruit and nut trail mix.  Eat balanced meals throughout the week. This includes:  Fruit: 2-3 servings a day  Vegetables: 4-5 servings a day  Low-fat dairy: 2 servings a day  Fish, poultry, or lean meat: 1 serving a day  Beans and legumes: 2 or more servings a week  Nuts and seeds: 1-2 servings a day  Whole grains: 6-8 servings a day  Extra-virgin olive oil: 3-4 servings a day  Limit red meat and sweets to only a few servings a month What are my food choices?  Mediterranean diet  Recommended  Grains: Whole-grain pasta. Brown rice. Bulgar wheat. Polenta. Couscous. Whole-wheat bread. Orpah Cobbatmeal. Quinoa.  Vegetables: Artichokes. Beets. Broccoli. Cabbage. Carrots. Eggplant. Green beans. Chard. Kale. Spinach. Onions. Leeks. Peas. Squash. Tomatoes. Peppers. Radishes.  Fruits: Apples. Apricots. Avocado. Berries. Bananas. Cherries. Dates. Figs. Grapes. Lemons. Melon. Oranges. Peaches. Plums. Pomegranate.  Meats and other protein foods: Beans. Almonds. Sunflower seeds. Pine nuts. Peanuts. Cod. Salmon. Scallops. Shrimp. Tuna. Tilapia. Clams. Oysters. Eggs.  Dairy: Low-fat milk. Cheese. Greek yogurt.  Beverages: Water. Red wine. Herbal  tea.  Fats and oils: Extra virgin olive oil. Avocado oil. Grape seed oil.  Sweets and desserts: [place] yogurt with honey. Baked apples. Poached pears. Trail mix.  Seasoning and other foods: Basil. Cilantro. Coriander. Cumin. Mint. Parsley. Sage. Rosemary. Tarragon. Garlic. Oregano. Thyme. Pepper. Balsalmic vinegar. Tahini. Hummus. Tomato sauce. Olives. Mushrooms.  Limit these  Grains: Prepackaged pasta or rice dishes. Prepackaged cereal with added sugar.  Vegetables: Deep fried potatoes (french fries).  Fruits: Fruit canned in syrup.  Meats and other protein foods: Beef. Pork. Lamb. Poultry with skin. Hot dogs. Tomasa BlaseBacon.  Dairy: Ice cream. Sour cream. Whole milk.  Beverages: Juice. Sugar-sweetened soft drinks. Beer. Liquor and spirits.  Fats and oils: Butter. Canola oil. Vegetable oil. Beef fat (tallow). Lard.  Sweets and desserts: Cookies. Cakes. Pies. Candy.  Seasoning and other foods: Mayonnaise. Premade sauces and marinades.  The items listed may not be a complete list. Talk with your dietitian about what dietary choices are right for you.  Summary  The Mediterranean diet includes both food and lifestyle choices.  Eat a variety of fresh fruits and vegetables, beans, nuts, seeds, and whole grains.  Limit the amount of red meat and sweets that you eat.  Talk with your health care provider about whether it is safe for you to drink red wine in moderation. This means 1 glass a day for nonpregnant women and 2 glasses a day for men. A glass of wine equals 5 oz (150 mL). This information is not intended to replace advice given to you by your health care provider. Make sure you discuss any questions you have with your health care provider. Document Released: 12/03/2015 Document Revised: 01/05/2016 Document Reviewed: 12/03/2015 Elsevier Interactive Patient Education  2017 [employer].

## 2020-09-13 NOTE — Progress Notes (Signed)
Assessment/Plan:   87 y.o. year old male with risk factors including hypertension, hyperlipidemia, COPD O2 dependent/history of asbestosis, protein calorie malnutrition, CAD, recent hospitalization for pneumonia, anemia, seen intimally [facility] in 02/2018 at which time he had a Neurpsychological evaluation, confirming the presence of Mild Neurocognitive Disorder likely due to Alzheimer's Disease. Apparently "purposely antagonize the tester so he does not feel he got a fair shake as far as his evaluation" and he did not want further appts. Upon moving to [facility], son wished to have patient further evaluated for cognitive decline.  Results from his PCP office showed MMSE was 20/30. Today's MoCA is 14/30 especially in delayed recall, language, with some visuospatial /executive deficiency.    Will proceed with further workup.  Recommendations are as follows :  Memory Loss  . MRI of the brain to evaluate for bleeding, brain size and other abnormalities . Neurocognitive testing to further determine what causes memory changes including sleep, stress, anxiety depression . Check B12, TSH  .  Will start Donepezil 10 mg as follows: Take half tablet (5 mg) daily for 2 weeks, then increase to the full tablet at 10 mg daily. Side effects were discussed, including  include nausea, vomiting, diarrhea, vivid dreams, and muscle cramps.  He was instructed to call the clinic if experiencing these symptoms  . Discussed safety both in and out of the home.  . Discussed the importance of regular daily schedule with inclusion of crossword puzzles to maintain brain function.  . Stay active at least 30 minutes at least 3 times a week.  . Naps should be scheduled and should be no longer than 60 minutes and should not occur after 2 PM.  . Mediterranean diet is recommended  . Follow up once results above are available   Subjective:    The patient is seen in neurologic consultation at the  request of Saguier, Kateri Mc for the evaluation of memory.  The patient is accompanied by his Son [name], who supplements the history.  The patient is a 87 y.o. year old right-handed male who has had memory issues for about  3-4 years. He was seen at [facility] [facility] in 02/2018 at which time he had a Neurpsych evaluation, confirming the presence of Mild Neurocognitive Disorder likely due to Alzheimer's Disease. "Patient states that he did go to the neuro psych appointment but he apparently "purposely antagonize the tester so he does not feel he got a fair shake as far as his evaluation" and he did not want further appts. his primary provider at the time did not feel starting him on medication such as Aricept, as he was moving out of [place], [place].  When he moved to [place] to live with his son, he reported a cognitive decline to his PCP, MMSE was 20/30. He was referred to [name] for further work-up.   Patient feels that memory is "good at times, but he may be getting worse", Son feels that he has definite trouble with retrieving words, especially with short-term memory, and to some degree with long-term memory, unable to remember names of family members that he knew throughout his life. Mood is good, without depression or irritability. Denies hallucinations or paranoia. Sleeps 8 to 9 hours a night, occasionally takes a nap while watching TV.  He likes to read, playing hearts on the computer, watching a lot of TV and walking with his son daily. Denies vivid dreams or sleepwalking. Patient  dresses up and bathes without help.  Denies missing medications and uses a pillbox.   Denies living objects in unusual places. Patient's does his own finances with his sons supervision.  Ambulating without difficulty without walker or cane. Patient continues to drive and denies getting lost.  Denies headaches, trauma, or injuries to the head, double vision,  dizziness, focal numbness or tingling,  unilateral weakness or tremors. Denies urine incontinence or retention. Denies constipation or diarrhea. Denies  ETOH or tobacco use.  Family History remarkable for mother having been diagnosed with Alzheimer's disease, died at age 29. He is widower and has 2 children.      Allergies  Allergen Reactions  . Penicillin G Swelling  . Penicillins Rash    Reported to family member - occurred when he was in his 27s     Current Outpatient Medications  Medication Instructions  . albuterol (VENTOLIN HFA) 108 (90 Base) MCG/ACT inhaler inhale 2 puffs by mouth into the lungs every 6 hours as needed for wheezing and shortness of breath  . atorvastatin (LIPITOR) 40 MG tablet TAKE 1 TABLET BY MOUTH ONCE DAILY  . donepezil (ARICEPT) 10 MG tablet take 5mg  daily (1/2 tab) by mouth  for 2 weeks, then  increase the dose to 1 tablet  (10mg ) daily  . Tiotropium Bromide-Olodaterol (STIOLTO RESPIMAT) 2.5-2.5 MCG/ACT AERS Inhalation, Daily     VITALS:   Vitals:   09/14/20 1036  BP: (!) 144/79  Pulse: 79  SpO2: 97%  Weight: 106 lb 6.4 oz (48.3 kg)  Height: 5\' 9"  (1.753 m)    HEENT:  Normocephalic, atraumatic. The mucous membranes are moist. The superficial temporal arteries are without ropiness or tenderness. Temporal wasting noted  Cardiovascular: Regular rate and rhythm. Lungs: Clear to auscultation bilaterally. Neck: There are no carotid bruits noted bilaterally.  NEUROLOGICAL:  Orientation:   Montreal Cognitive Assessment  09/14/2020  Visuospatial/ Executive (0/5) 2  Naming (0/3) 2  Attention: Read list of digits (0/2) 1  Attention: Read list of letters (0/1) 1  Attention: Serial 7 subtraction starting at 100 (0/3) 3  Language: Repeat phrase (0/2) 0  Language : Fluency (0/1) 1  Abstraction (0/2) 2  Delayed Recall (0/5) 0  Orientation (0/6) 2  Total 14   Cranial nerves: There is good facial symmetry. Extraocular muscles are intact and visual fields are full to confrontational testing.  Speech is fluent and clear. Soft palate rises symmetrically and there is no tongue deviation. Hearing is intact to conversational tone. Tone: Tone is good throughout. Sensation: Sensation is intact to light touch and pinprick throughout. Vibration is intact at the bilateral big toe. There is no extinction with double simultaneous stimulation. There is no sensory dermatomal level identified. Coordination:  The patient has no difficulty with RAM's or FNF bilaterally. Motor: Strength is 5/5 in the bilateral upper and lower extremities. There is no pronator drift.  There are no fasciculations noted. DTR's: Deep tendon reflexes are 2/4 at the bilateral biceps, triceps, brachioradialis, patella and achilles.  Plantar responses are downgoing bilaterally. Gait and Station: The patient is able to ambulate without difficulty. The patient is able to heel toe walk without any difficulty. The patient is able to ambulate in a tandem fashion. The patient is able to stand in the Romberg position.    Total time spent on today's visit was  60 minutes, including both face-to-face time and nonface-to-face time.  Time included that spent on review of records (prior notes available to me/labs/imaging if pertinent),  discussing treatment and goals, answering patient's questions and coordinating care.  Cc:  Saguier, Legrand Pitts [facility] 09/14/2020 12:02 PM

## 2020-09-14 ENCOUNTER — Other Ambulatory Visit (INDEPENDENT_AMBULATORY_CARE_PROVIDER_SITE_OTHER): Payer: Medicare Other

## 2020-09-14 ENCOUNTER — Encounter: Payer: Self-pay | Admitting: Physician Assistant

## 2020-09-14 ENCOUNTER — Ambulatory Visit (INDEPENDENT_AMBULATORY_CARE_PROVIDER_SITE_OTHER): Payer: Medicare Other | Admitting: Physician Assistant

## 2020-09-14 ENCOUNTER — Other Ambulatory Visit (HOSPITAL_BASED_OUTPATIENT_CLINIC_OR_DEPARTMENT_OTHER): Payer: Self-pay

## 2020-09-14 ENCOUNTER — Other Ambulatory Visit: Payer: Self-pay

## 2020-09-14 VITALS — BP 144/79 | HR 79 | Ht 69.0 in | Wt 106.4 lb

## 2020-09-14 DIAGNOSIS — R413 Other amnesia: Secondary | ICD-10-CM

## 2020-09-14 LAB — VITAMIN B12: Vitamin B-12: 489 pg/mL (ref 211–911)

## 2020-09-14 LAB — TSH: TSH: 3.13 u[IU]/mL (ref 0.35–4.50)

## 2020-09-14 MED ORDER — DONEPEZIL HCL 10 MG PO TABS
ORAL_TABLET | ORAL | 11 refills | Status: DC
  Filled 2020-09-14: qty 30, 37d supply, fill #0

## 2020-09-14 NOTE — Telephone Encounter (Signed)
Its in epic , under media ( assessment )

## 2020-09-15 ENCOUNTER — Encounter: Payer: Self-pay | Admitting: Physician Assistant

## 2020-09-24 ENCOUNTER — Other Ambulatory Visit: Payer: Self-pay

## 2020-09-24 ENCOUNTER — Ambulatory Visit (INDEPENDENT_AMBULATORY_CARE_PROVIDER_SITE_OTHER): Payer: Medicare Other | Admitting: Emergency Medicine

## 2020-09-24 ENCOUNTER — Encounter: Payer: Self-pay | Admitting: Emergency Medicine

## 2020-09-24 ENCOUNTER — Ambulatory Visit (INDEPENDENT_AMBULATORY_CARE_PROVIDER_SITE_OTHER): Payer: Medicare Other

## 2020-09-24 DIAGNOSIS — J449 Chronic obstructive pulmonary disease, unspecified: Secondary | ICD-10-CM | POA: Diagnosis not present

## 2020-09-24 DIAGNOSIS — J9611 Chronic respiratory failure with hypoxia: Secondary | ICD-10-CM | POA: Diagnosis not present

## 2020-09-24 DIAGNOSIS — J920 Pleural plaque with presence of asbestos: Secondary | ICD-10-CM | POA: Diagnosis not present

## 2020-09-24 DIAGNOSIS — J189 Pneumonia, unspecified organism: Secondary | ICD-10-CM

## 2020-09-24 NOTE — Assessment & Plan Note (Signed)
He will need surveillance imaging.  I will see him in 6 months and we will decide whether to perform chest x-ray or the timing of any repeat CT chest.

## 2020-09-24 NOTE — Progress Notes (Signed)
Subjective:    Patient ID: Jonathan Harmon, male    DOB: Feb 05, 1934, 87 y.o.   MRN: [id number]  HPI 87 year old former smoker (40 pack years) with COPD, emphysematous change by imaging.  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  He has been seen in our office by Dr DeDios, most recently by B Mack.  Was treated by phone for an acute exacerbation in April with prednisone and doxycycline.  Saw B Mack in May with increased dyspnea. He was on Spiriva, changed to Anoro and then ultimately to [facility] in May 2020 for increased dyspnea. He has apparently been   He is able to walk over a mile, does this every other day.   Repeat CT scan of his chest was done on 12/12/2018 which I reviewed.  This shows significant pleural involvement with scarring and calcifications including an unchanged elongated 12 mm pulmonary nodule at his right pulmonary apex.  He has some bibasilar fibrotic change with honeycomb formation, no apparent groundglass.  This is slightly increased when compared back to 11/2015  Pulmonary function testing 07/2016 reviewed by me, shows severe obstruction, FEV1 56% predicted, with hyperinflated total lung capacity.  ROV 08/21/20 --follow-up visit for 87 year old gentleman, former smoker with emphysema and COPD, occupational exposure to asbestos as a IT sales professional with findings consistent with asbestosis and pleural plaques, UIP changes on CT chest.  Last seen here by TP 05/27/2020, was continued on Stiolto and albuterol as needed - using rarely. He was admitted for community-acquired pneumonia earlier this month with hypoxic respiratory failure.  He was discharged on 3 L/min. He has completed his abx - levaquin. They have seen some SpO2 in the mid 80's. He remains lethargic, has some low appetite.   Most recent CT chest 06/01/2020 reviewed by me, showed extensive  pleural plaquing, severe emphysema, mild interstitial disease in a UIP pattern that was stable compared with 11/2018 but slightly worse compared with 2017.  There was a decrease in size of right upper lobe nodular opacity consistent with prior inflammation.   ROV 09/24/2020 --87 year old man with COPD/emphysema, occupational asbestos exposure with pleural plaques, UIP changes on CT chest.  He had pneumonia in April for which she was still recovering when I saw him just over 1 month ago.  We continue to [employer]. He is more active, appears to be less SOB. Not fully compliant with his O2 w exertion. He has cough through the day, occasionally productive.   CXR today reviewed shows that RUL infiltrate has resolved.    Review of Systems As per HPI      Objective:   Physical Exam Vitals:   09/24/20 1139  BP: 110/70  Pulse: 92  Temp: 98.1 F (36.7 C)  TempSrc: Temporal  SpO2: 92%  Weight: 105 lb 6.4 oz (47.8 kg)  Height: 5\' 8"  (1.727 m)   Gen: Pleasant, thin, in no distress,  normal affect  ENT: No lesions,  mouth clear,  oropharynx clear, no postnasal drip  Neck: No JVD, no stridor  Lungs: No use of accessory muscles, no crackles or wheezing on normal respiration, he does wheeze on forced expiration  Cardiovascular: RRR, heart sounds normal, no murmur or gallops, no peripheral edema  Musculoskeletal: No deformities, no cyanosis or clubbing  Neuro: alert, awake, non focal  Skin: Warm, no lesions or rash      Assessment & Plan:  Community acquired pneumonia of right  lung He has improved clinically and his chest x-ray shows resolution of the right upper lobe infiltrate.  Calcified pleural plaque due to asbestos exposure He will need surveillance imaging.  I will see him in 6 months and we will decide whether to perform chest x-ray or the timing of any repeat CT chest.  COPD (chronic obstructive pulmonary disease) (HCC) Severe obstructive disease.  Plan to continue Stiolto.  He  has albuterol available to use but does not require it frequently.  Plan to continue same regimen  Chronic respiratory failure with hypoxia (HCC) Documented exertional desaturations.  His compliance with exertional oxygen is poor.  He does sometimes wear oxygen at home when he is sitting still.  Discussed the importance of adequate saturation, wearing his oxygen with exertion with him and his son today.  Goal is to keep his oxygen saturations greater than 90%  Levy Pupa, MD, PhD 09/24/2020, 1:36 PM [facility] [phone] or if no answer [phone]

## 2020-09-24 NOTE — Assessment & Plan Note (Signed)
He has improved clinically and his chest x-ray shows resolution of the right upper lobe infiltrate.

## 2020-09-24 NOTE — Assessment & Plan Note (Signed)
Documented exertional desaturations.  His compliance with exertional oxygen is poor.  He does sometimes wear oxygen at home when he is sitting still.  Discussed the importance of adequate saturation, wearing his oxygen with exertion with him and his son today.  Goal is to keep his oxygen saturations greater than 90%

## 2020-09-24 NOTE — Patient Instructions (Addendum)
Continue Stiolto 2 puffs once daily. Use albuterol (Ventolin) 2 puffs if you need it for shortness of breath, chest tightness, wheezing. Your chest x-ray today shows that your pneumonia has resolved. You would benefit from wearing your oxygen when you exert yourself. Follow Dr. Delton Coombes in 6 months.  At that time we will decide when we need to do any repeat chest imaging to follow changes associated with asbestos.

## 2020-09-24 NOTE — Assessment & Plan Note (Signed)
Severe obstructive disease.  Plan to continue Stiolto.  He has albuterol available to use but does not require it frequently.  Plan to continue same regimen

## 2020-09-28 ENCOUNTER — Other Ambulatory Visit: Payer: Self-pay

## 2020-09-28 ENCOUNTER — Ambulatory Visit
Admission: RE | Admit: 2020-09-28 | Discharge: 2020-09-28 | Disposition: A | Discharge status: Home / Self Care | Payer: Medicare Other | Source: Ambulatory Visit | Attending: Physician Assistant | Admitting: Physician Assistant

## 2020-09-28 DIAGNOSIS — R413 Other amnesia: Secondary | ICD-10-CM

## 2020-09-30 ENCOUNTER — Encounter: Payer: Self-pay | Admitting: Physician Assistant

## 2020-10-02 ENCOUNTER — Other Ambulatory Visit: Payer: Self-pay | Admitting: Adult Health

## 2020-10-02 ENCOUNTER — Other Ambulatory Visit (HOSPITAL_BASED_OUTPATIENT_CLINIC_OR_DEPARTMENT_OTHER): Payer: Self-pay

## 2020-10-05 ENCOUNTER — Other Ambulatory Visit (HOSPITAL_BASED_OUTPATIENT_CLINIC_OR_DEPARTMENT_OTHER): Payer: Self-pay

## 2020-10-06 ENCOUNTER — Other Ambulatory Visit (HOSPITAL_BASED_OUTPATIENT_CLINIC_OR_DEPARTMENT_OTHER): Payer: Self-pay

## 2020-10-07 ENCOUNTER — Other Ambulatory Visit (HOSPITAL_BASED_OUTPATIENT_CLINIC_OR_DEPARTMENT_OTHER): Payer: Self-pay

## 2020-10-08 ENCOUNTER — Telehealth: Payer: Self-pay | Admitting: Emergency Medicine

## 2020-10-08 ENCOUNTER — Other Ambulatory Visit: Payer: Self-pay

## 2020-10-08 ENCOUNTER — Other Ambulatory Visit (HOSPITAL_BASED_OUTPATIENT_CLINIC_OR_DEPARTMENT_OTHER): Payer: Self-pay

## 2020-10-08 ENCOUNTER — Encounter: Payer: Self-pay | Admitting: Physician Assistant

## 2020-10-08 MED ORDER — STIOLTO RESPIMAT 2.5-2.5 MCG/ACT IN AERS
1.0000 | INHALATION_SPRAY | Freq: Every day | RESPIRATORY_TRACT | 11 refills | Status: AC
  Filled 2020-10-08: qty 4, 30d supply, fill #0
  Filled 2020-11-18: qty 4, 30d supply, fill #1
  Filled 2021-03-08: qty 4, 30d supply, fill #2
  Filled 2021-05-14: qty 4, 30d supply, fill #3
  Filled 2021-07-05: qty 4, 30d supply, fill #4

## 2020-10-08 NOTE — Telephone Encounter (Signed)
Called and spoke with [facility]. Advised her that refills have been sent in. States she will call patient to have him pick it up tomorrow.  Nothing further needed at this time

## 2020-10-08 NOTE — Progress Notes (Signed)
Refill sent in to pharmacy 

## 2020-10-09 ENCOUNTER — Other Ambulatory Visit (HOSPITAL_BASED_OUTPATIENT_CLINIC_OR_DEPARTMENT_OTHER): Payer: Self-pay

## 2020-10-12 ENCOUNTER — Other Ambulatory Visit: Payer: Self-pay

## 2020-10-12 ENCOUNTER — Telehealth: Payer: Self-pay | Admitting: Medical

## 2020-10-12 ENCOUNTER — Ambulatory Visit (INDEPENDENT_AMBULATORY_CARE_PROVIDER_SITE_OTHER): Payer: Medicare Other | Admitting: Medical

## 2020-10-12 ENCOUNTER — Encounter: Payer: Self-pay | Admitting: Medical

## 2020-10-12 VITALS — BP 120/60 | HR 85 | Temp 98.1°F | Resp 18 | Ht 68.0 in | Wt 105.4 lb

## 2020-10-12 DIAGNOSIS — J449 Chronic obstructive pulmonary disease, unspecified: Secondary | ICD-10-CM | POA: Diagnosis not present

## 2020-10-12 DIAGNOSIS — Z7185 Encounter for immunization safety counseling: Secondary | ICD-10-CM

## 2020-10-12 DIAGNOSIS — E785 Hyperlipidemia, unspecified: Secondary | ICD-10-CM

## 2020-10-12 DIAGNOSIS — I10 Essential (primary) hypertension: Secondary | ICD-10-CM

## 2020-10-12 DIAGNOSIS — F09 Unspecified mental disorder due to known physiological condition: Secondary | ICD-10-CM

## 2020-10-12 NOTE — Patient Instructions (Addendum)
History of htn. Pt bp 130/80 today and on recheck 120/60. Used to be on losartan but stopped taking. Stay off and continue to check. May put back on in future if needed.  For mild-moderate dementia continue aricept.  For copd continue stiolto inhaler daily, abluterol if needed and 02 at home.  For high cholesterol continue atorvastatin.  Follow up in 6 months or as needed

## 2020-10-12 NOTE — Telephone Encounter (Signed)
Sarah and Dr. Karel Jarvis,  Pt had probable dementia. Your practice has been seeing Mr. Mccleod. Neuropsychological testing is pending. Son is requesting letter explaining degree of pt diminished mental capacity.   I think son needs to start getting some of dads affairs in order.  He asked that I write the letter then saw your correspondence in epic.  I think he is scheduled for testing in September. Can testing date be expedited?  Thanks,  Esperanza Richters, PA-C

## 2020-10-12 NOTE — Progress Notes (Signed)
Subjective:    Patient ID: Jonathan Harmon, male    DOB: Mar 27, 1934, 87 y.o.   MRN: [id number]  HPI  Pt in for follow up.  I did refer to neurology for memory concerns.  "Recommendations:   Neurocognitive evaluation at our office MRI of the brain, the office will call you to arrange you appointment Check B12 and TSH at the lab  We will start donepezil (Aricept) 5mg  daily (half tablet)  for 2 weeks then increase the dose to 10mg  daily.  Side effects include nausea, vomiting, diarrhea, vivid dreams, and muscle cramps.   Will follow up after results become available."   Son thinks dad is functionally getting worse.  Son indicates he needs power of attorney due to dad diminished mental capacity as well as getting into adult day care potentially. Pt son has not had neuropsychological evaluation yet.  Per son he is more agitated and for forgetful.   Pt had appointment with pulmonologist as well. CXR done early June. Pt on stiolto daily. Rarely uses albuterol. Admits not using oxygen at home. Uses if very short of breath. Rarely his drop less than 88%.   IMPRESSION: Stable appearance of large bilateral calcified pleural plaques consistent with asbestos exposure. No definite acute abnormality is noted.    Review of Systems  Constitutional:  Negative for appetite change, diaphoresis and fatigue.  HENT:  Negative for dental problem.   Respiratory:  Negative for cough, chest tightness, shortness of breath and wheezing.   Cardiovascular:  Negative for chest pain and palpitations.  Gastrointestinal:  Negative for abdominal distention, abdominal pain, blood in stool, constipation and diarrhea.  Genitourinary:  Negative for dysuria.  Musculoskeletal:  Negative for back pain.  Skin:  Negative for rash.  Neurological:  Negative for dizziness and headaches.  Hematological:  Negative for adenopathy. Does not bruise/bleed easily.  Psychiatric/Behavioral:  Negative for behavioral problems  and confusion.     Past Medical History:  Diagnosis Date   Asbestos exposure    diminished lung compacity from asbestos exposure 30 years   COPD (chronic obstructive pulmonary disease) (HCC)    Hyperlipidemia    Hypertension    Pneumonia    x 5     Social History   Socioeconomic History   Marital status: Widowed    Spouse name: Not on file   Number of children: Not on file   Years of education: Not on file   Highest education level: Not on file  Occupational History   Occupation: Retired  Tobacco Use   Smoking status: Former    Packs/day: 1.00    Years: 40.00    Pack years: 40.00    Types: Cigarettes    Quit date: 04/25/2000    Years since quitting: 20.4   Smokeless tobacco: Never  Vaping Use   Vaping Use: Never used  Substance and Sexual Activity   Alcohol use: No    Comment: rarely   Drug use: No   Sexual activity: Not Currently  Other Topics Concern   Not on file  Social History Narrative   Right handed    Lives with his son   Social Determinants of Health   Financial Resource Strain: Not on file  Food Insecurity: Not on file  Transportation Needs: Not on file  Physical Activity: Not on file  Stress: Not on file  Social Connections: Not on file  Intimate Partner Violence: Not on file    Past Surgical History:  Procedure Laterality Date  APPENDECTOMY     SHOULDER SURGERY Bilateral    x 5 ( 2 on right ; 3 on left)    Family History  Problem Relation Age of Onset   Heart attack Father     Allergies  Allergen Reactions   Penicillin G Swelling   Penicillins Rash    Reported to family member - occurred when he was in his 56s     Current Outpatient Medications on File Prior to Visit  Medication Sig Dispense Refill   albuterol (VENTOLIN HFA) 108 (90 Base) MCG/ACT inhaler inhale 2 puffs by mouth into the lungs every 6 hours as needed for wheezing and shortness of breath 8.5 g 11   atorvastatin (LIPITOR) 40 MG tablet TAKE 1 TABLET BY  MOUTH ONCE DAILY (Patient taking differently: Take 40 mg by mouth daily.) 30 tablet 3   donepezil (ARICEPT) 10 MG tablet take 5mg  daily (1/2 tab) by mouth  for 2 weeks, then  increase the dose to 1 tablet  (10mg ) daily (Patient taking differently: take 5mg  daily (1/2 tab) by mouth  for 2 weeks, then  increase the dose to 1 tablet  (10mg ) daily) 30 tablet 11   Tiotropium Bromide-Olodaterol (STIOLTO RESPIMAT) 2.5-2.5 MCG/ACT AERS Inhale 1 puff into the lungs daily. 4 g 11   No current facility-administered medications on file prior to visit.    BP 120/60   Pulse 85   Temp 98.1 F (36.7 C)   Resp 18   Ht 5\' 8"  (1.727 m)   Wt 105 lb 6.4 oz (47.8 kg)   SpO2 93%   BMI 16.03 kg/m        Objective:   Physical Exam  General Mental Status- Alert. General Appearance- Not in acute distress.   Skin General: Color- Normal Color. Moisture- Normal Moisture.    Chest and Lung Exam Auscultation: Breath Sounds:-Normal.  Cardiovascular Auscultation:Rythm- Regular. Murmurs & Other Heart Sounds:Auscultation of the heart reveals- No Murmurs.  Abdomen Inspection:-Inspeection Normal. Palpation/Percussion:Note:No mass. Palpation and Percussion of the abdomen reveal- Non Tender, Non Distended + BS, no rebound or guarding.    Neurologic Cranial Nerve exam:- CN III-XII intact(No nystagmus), symmetric smile. Strength:- 5/5 equal and symmetric strength both upper and lower extremities.       Assessment & Plan:  History of htn. Pt bp 130/80 today and on recheck 120/60. Used to be on losartan but stopped taking. Stay off and continue to check. May put back on in future if needed.  For mil-moderate dementia continue aricept.  For copd continue stiolto inhaler daily, abluterol if needed and 02 at home.  For high cholesterol continue atorvastatin.  Follow up in 6 months or as needed  , 

## 2020-10-14 ENCOUNTER — Ambulatory Visit (INDEPENDENT_AMBULATORY_CARE_PROVIDER_SITE_OTHER): Payer: Medicare Other | Admitting: Counselor

## 2020-10-14 ENCOUNTER — Encounter: Payer: Self-pay | Admitting: Counselor

## 2020-10-14 ENCOUNTER — Ambulatory Visit: Payer: Medicare Other | Admitting: Psychology

## 2020-10-14 ENCOUNTER — Other Ambulatory Visit: Payer: Self-pay

## 2020-10-14 DIAGNOSIS — F02818 Dementia in other diseases classified elsewhere, unspecified severity, with other behavioral disturbance: Secondary | ICD-10-CM

## 2020-10-14 DIAGNOSIS — F0280 Dementia in other diseases classified elsewhere without behavioral disturbance: Secondary | ICD-10-CM

## 2020-10-14 DIAGNOSIS — R4189 Other symptoms and signs involving cognitive functions and awareness: Secondary | ICD-10-CM

## 2020-10-14 DIAGNOSIS — G3101 Pick's disease: Secondary | ICD-10-CM

## 2020-10-14 DIAGNOSIS — F0281 Dementia in other diseases classified elsewhere with behavioral disturbance: Secondary | ICD-10-CM

## 2020-10-14 DIAGNOSIS — G301 Alzheimer's disease with late onset: Secondary | ICD-10-CM

## 2020-10-14 NOTE — Progress Notes (Signed)
NEUROPSYCHOLOGICAL EVALUATION [facility]  Patient Name: Jonathan Harmon MRN: [id number] Date of Birth: 1934-04-02 Age: 87 y.o. Education: 16 years  Referral Circumstances and Background Information  Mr. Gabriel Conry is a 87 y.o., right-hand dominant, widowed man with a history of mild cognitive impairment ("probable early Alzheimer's") diagnosed after a neuropsychological evaluation in 2019. I do not see the report in the computer, although there is mention of it in notes from Dr. Jori Moll ([facility]). More recently, Mr. Rewerts followed up with Marlowe Kays, PA-C with our outpatient neurology practice who referred him for updated evaluation.    On interview, the patient reported that he doesn't think that he has problems with memory and thinking. He denied that anybody is concerned about his memory and thinking. I was able to talk with his son [name] apart from him with his assent. He reported that the patient has very significant memory problems and that they have really accelerated over the past several months. He had pneumonia in April and was in the hospital and it has been downhill since then. His son wasn't sure when exactly the changes started, but he first noticed them in 2019 if not a little before. Over the past several years, since he has been living with them, there has been a significant decline and he frequently repeats himself and forgets things. He also has very poor insight and thinks that he has no problems. He gets disoriented when they change his routine and will get sticky on things. He rapidly forgets things, within about 10 minutes. He has repeated things for years and as of late, he has had a harder time with recall of remote events. He is somewhat more restless since the pneumonia and is also more argumentative. He reported that his mood is good, he is eating well, and he is sleeping adequately. His weight is low, however, with a BMI of 16. He  stated that he usually gets about 9 hours of sleep. There is no dream enactment behavior.   The patient was living with his wife in [place] and she passed in 2017. He was living on his own until 2019 and then moved in with the patient in November of 2019. His son reported that it was more loneliness than anything, although he did have some subtle changes at that time. He has been getting help with his finances from his son for some time, although he has very minimal expenses. He has two recurring bills, both on auto pay. He was having problems and repeatedly forgot his pin so many times his son had to get a new card. He doesn't remember his account numbers. He says he is active in the [employer] and does have an account but his son looks at his statements and he does not place any trades. He is still driving, although it is essentially within a 3 mile radius of the house. He knows how to go to [employer], the grocery store and that is about it. His son is somewhat concerned about his driving although admits he generally does well within the circumscribed area. He has had some thoughts of visiting [place] and will get ornery about it when his son tells him that he can't drive their himself. He has essentially no function around the house, although he will do his laundry and fold his clothes. He seems less interested in that over the past two months and is not as attentive to his appearance.  He does not cook for himself and never has. He cannot use a smart phone, he has a flip phone. He has a computer but cannot do anything other than playing hearts. He was previously sending and checking e-mails and he doesn't do that anymore.   Past Medical History and Review of Relevant Studies   Patient Active Problem List   Diagnosis Date Noted   Community acquired pneumonia of right lung 08/11/2020   Asbestosis (HCC) 08/11/2020   Chronic respiratory failure with hypoxia (HCC) 08/11/2020   HTN (hypertension)  08/11/2020   Protein calorie malnutrition (HCC) 05/27/2020   Arthralgia of both hands 10/29/2019   Allergic rhinitis 08/31/2018   Cognitive disorder 01/07/2018   Exertional dyspnea 07/28/2016   COPD (chronic obstructive pulmonary disease) (HCC) 07/28/2016   Calcified pleural plaque due to asbestos exposure 07/28/2016   Review of Neuroimaging and Relevant Medical History: The patient has an MRI of the brain from 09/28/2020 with findings as described in the report, although the extent of his temporal lobe volume loss is advanced and there is very clear temporal lobar predominance to his volume loss. His temporal lobes measure at 3 bilaterally on the Sheltens scale, which is abnormal for his age. The imaging is strongly suggestive of Alzheimer's (specifically a limbic-predominate imaging presentation).   Current Outpatient Medications  Medication Sig Dispense Refill   albuterol (VENTOLIN HFA) 108 (90 Base) MCG/ACT inhaler inhale 2 puffs by mouth into the lungs every 6 hours as needed for wheezing and shortness of breath 8.5 g 11   atorvastatin (LIPITOR) 40 MG tablet TAKE 1 TABLET BY MOUTH ONCE DAILY (Patient taking differently: Take 40 mg by mouth daily.) 30 tablet 3   donepezil (ARICEPT) 10 MG tablet take 5mg  daily (1/2 tab) by mouth  for 2 weeks, then  increase the dose to 1 tablet  (10mg ) daily (Patient taking differently: take 5mg  daily (1/2 tab) by mouth  for 2 weeks, then  increase the dose to 1 tablet  (10mg ) daily) 30 tablet 11   Tiotropium Bromide-Olodaterol (STIOLTO RESPIMAT) 2.5-2.5 MCG/ACT AERS Inhale 1 puff into the lungs daily. 4 g 11   No current facility-administered medications for this visit.   Family History  Problem Relation Age of Onset   Heart attack Father    There is a family history of dementia. The patient's son is saying that his mother had Alzheimer's although the patient says that isn't the case (it sounds like he is mistaken). This was later in life. There is no   family history of psychiatric illness.  Psychosocial History  Developmental, Educational and Employment History: The patient reported that he liked school and did very well. I asked what his grades where and he presented as uncertain. He denied ever being held back or having any learning problem. He got a degree in general business from a 4 year college in . 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 He wasn't sure how many men he had under him but estimated it was around 50. He retired from that and then worked as a for a company that . He retired at 48. 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   Psychiatric History: The patient denied any significant psychiatric history.   Substance Use History: The patient smoked for many years, he quit  when he moved to [place] in 2002.   Relationship History and Living Cimcumstances: The patient was married for many years. His son stated that he was married for 54 years.   Mental Status and Behavioral Observations  Sensorium/Arousal: The patient's level of arousal was awake and alert. Hearing and vision were adequate for testing purposes. Orientation: The patient was oriented to person only and reported that we were in [facility], that it was 2023, although he did get the day of the week.  Appearance: Gaunt appearing man dressed in casual clothing with appropriate grooming and hygiene.  Behavior: The patient constantly took his mask off when there were hearing problems or he wanted to talk, at least 30 times throughout the encounter. He presented as significantly impaired and was somewhat argumentative at times.  Speech/language: Normal in rate, rhythm, volume and prosody.  Gait/Posture: Not formally examined Movement: No rest tremor or concerning adventitious  movements noted.  Social Comportment: Patient was a bit bellicose but was able to cooperate with the interview and testing when provided with humor and support.  Mood: "Pretty good" Affect: Mildly agitated Thought process/content: Thought process was difficult to assess, as his vocalizations were largely limited to responding to questions. The information he provided was frequently not factual. Safety: No safety concerns identified in this euthymic patient.  Insight: Patient with significant anosognosia.   MMSE - Mini Mental State Exam 10/14/2020  Orientation to time 1  Orientation to Place 1  Registration 3  Attention/ Calculation 5  Recall 0  Language- name 2 objects 2  Language- repeat 0  Language- follow 3 step command 3  Language- read & follow direction 1  Write a sentence 1  Copy design 1  Total score 18   Test Procedures  Wide Range Achievement Test - 4             Word Reading Neuropsychological Assessment Battery  List Learning  Naming  Digit Span Repeatable Battery for the Assessment of Neuropsychological Status (Form A)  Figure Copy  Judgment of Line Orientation  Coding  Figure Recall Controlled Oral Word Association (F-A-S) Semantic Fluency (Animals) Trail Making Test A & B Complex Ideational Material Geriatric Depression Scale - Short Form  Plan  Cristofer Yaffe was seen for a psychiatric diagnostic evaluation and neuropsychological testing. He is an 87 year old, right-hand dominant, widowed man with a history of thinking and memory problems since at least 2019. His difficulties have been slowly progressive at that time with significant deterioration in the past several months as per his son (he had pneumonia and was sick in April of this year). On interview he is screening in the moderate dementia range and has significant anosognosia. I see documentation in the chart that there is a capacity issue. I was able to discuss this with the patient's son, he is concerned  that the patient might make poor decisions that would put himself at risk. For example he has stated he is going to drive to [place], at times. He wants to make sure he is in a position to keep the patient safe. Full and complete note with impressions, recommendations, and interpretation of test data to follow.   Bettye Boeck Roseanne Reno, PsyD, ABN Clinical Neuropsychologist  Informed Consent  Risks and benefits of the evaluation were discussed with the patient prior to all testing procedures. I conducted a clinical interview and neuropsychological testing (at least two tests) with Conchita Paris and Wallace Keller, B.S. (Technician) administered additional test procedures. The patient was  able to tolerate the testing procedures and the patient (and/or family if applicable) is likely to benefit from further follow up to receive the diagnosis and treatment recommendations, which will be rendered at the next encounter.

## 2020-10-14 NOTE — Progress Notes (Signed)
   Psychometrist Note   Cognitive testing was administered to Jonathan Harmon by Milana Kidney, B.S. (Technician) under the supervision of Alphonzo Severance, Psy.D., ABN. [name] was able to tolerate all test procedures. Dr. Nicole Kindred met with the patient as needed to manage any emotional reactions to the testing procedures. Rest breaks were offered.    The battery of tests administered was selected by Dr. Nicole Kindred with consideration to the patient's current level of functioning, the nature of his symptoms, emotional and behavioral responses during the interview, level of literacy, observed level of motivation/effort, and the nature of the referral question. This battery was communicated to the psychometrist. Communication between Dr. Nicole Kindred and the psychometrist was ongoing throughout the evaluation and Dr. Nicole Kindred was immediately accessible at all times. Dr. Nicole Kindred provided supervision to the technician on the date of this service, to the extent necessary to assure the quality of all services provided.    Mr. Kreitzer will return in approximately one week for an interactive feedback session with Dr. Nicole Kindred, at which time test performance, clinical impressions, and treatment recommendations will be reviewed in detail. The patient understands he can contact our office should he require our assistance before this time.   A total of 75 minutes of billable time were spent with Jonathan Harmon by the technician, including test administration and scoring time. Billing for these services is reflected in Dr. Les Pou note.   This note reflects time spent with the psychometrician and does not include test scores, clinical history, or any interpretations made by Dr. Nicole Kindred. The full report will follow in a separate note.

## 2020-10-15 NOTE — Progress Notes (Signed)
NEUROPSYCHOLOGICAL TEST SCORES [facility]  Patient Name: Jonathan Harmon MRN: [id number] Date of Birth: 1933/08/02 Age: 87 y.o. Education: 16 years  Measurement properties of test scores: IQ, Index, and Standard Scores (SS): Mean = 100; Standard Deviation = 15 Scaled Scores (Ss): Mean = 10; Standard Deviation = 3 Z scores (Z): Mean = 0; Standard Deviation = 1 T scores (T); Mean = 50; Standard Deviation = 10  TEST SCORES:    Note: This summary of test scores accompanies the interpretive report and should not be interpreted by unqualified individuals or in isolation without reference to the report. Test scores are relative to age, gender, and educational history as available and appropriate.   Mental Status Screening     Total Score Descriptor  MMSE 17 Moderate Dementia      Expected Functioning        Wide Range Achievement Test: Standard/Scaled Score Percentile      Word Reading 114 82      Attention/Processing Speed        Neuropsychological Assessment Battery (Attention Module, Form 1): T-score Percentile      Digits Forward 55 69      Digits Backwards 59 82      Repeatable Battery for the Assessment of Neuropsychological Status (Form A): Scaled Score Percentile      Coding 5 5      Language        Neuropsychological Assessment Battery (Language Module, Form 1): T-score Percentile      Naming   (30) 62 88      Verbal Fluency: T-score Percentile      Controlled Oral Word Association (F-A-S) 39 14      Semantic Fluency (Animals) 29 2      Memory:        Neuropsychological Assessment Battery (Memory Module, Form 1): T-score Percentile      List Learning           List A Immediate Recall   (0, 3, 5) 19 <1         List B Immediate Recall   (3) 44 27         List A Short Delayed Recall   (0) 20 <1         List A Long Delayed Recall   (0) 27 1         List A Percent Retention   (0 %) --- <1         List A Long Delayed Yes/No Recognition Hits   (3) --- <1          List A Long Delayed Yes/No Recognition False Alarms   (2) --- 79         List A Recognition Discriminability Index --- 9      Repeatable Battery for the Assessment of Neuropsychological Status (Form A): Scaled Score Percentile         Figure Recall   (0) 1 <1      Visuospatial/Constructional Functioning        Repeatable Battery for the Assessment of Neuropsychological Status (Form A): Standard/Scaled Score Percentile     Visuospatial/Constructional Index 112 79         Figure Copy   (20) 14 91         Judgment of Line Orientation   (16) --- 26-50      Executive Functioning        Trail Making Test: T-Score Percentile  Part A 20 <1      Part B 39 14      Boston Diagnostic Aphasia Exam: Raw Score Scaled Score      Complex Ideational Material 8 3      Clock Drawing Raw Score Descriptor      Command 10 WNL      Rating Scales        Clinical Dementia Rating Raw Score Descriptor      Sum of Boxes 11.0 Moderate Dementia      Global Score 2.0 Moderate Dementia   Delorus Langwell V. Roseanne Reno PsyD, ABN Clinical Neuropsychologist

## 2020-10-19 NOTE — Progress Notes (Signed)
NEUROPSYCHOLOGICAL EVALUATION [facility]  Patient Name: Jonathan Harmon MRN: [id number] Date of Birth: 09-Feb-1934 Age: 87 y.o. Education: 16 years  Clinical Impressions  Jonathan Harmon is a 87 y.o., right-hand dominant, widowed man with a history of mild cognitive impairment (probably Alzheimer's) diagnosed in 2019. He moved in with his son around that time and his son has noticed progressive decline with significant deterioration over the past 2 months since the patient was sick with Pneumonia in the hospital. At present,. he has rapid forgetting of information, significant anosognosia, and he is more argumentative than in the past. His son is considering guardianship because the patient does not have a POA appointed and he worries that he would not be able to safeguard the patient if it were needed. He has an MRI of the brain that shows moderate to severe volume within the mesial temporal lobes that is definitely disproportionate to other areas.   Neuropsychological assessment demonstrates memory storage problems and diminished semantic as compared to phonemic fluency, which are findings often observed in the setting of Alzheimer's clinical syndrome. He does not have naming impairment. Performance was reasonable in all other domains, although he did have scattered low scores on measures relevant to executive functioning including timed number-symbol coding and reasoning with verbal information. He screened negative for the presence of depression and he falls in the moderate dementia range on the CDR.   [name] is thus demonstrating a clinical dementia syndrome with a high index of suspicion for Alzheimer's disease given demographics, supportive structural imaging and neuropsychological test data. I think that his recent worsening is the result of his pneumonia and medical illness and he may realize some improvement or at least stabilization now that he is convalescing. He is most certainly not  capable of making medical and financial decisions of high risk, in my opinion, given very significant anosognosia and substantial cognitive impairments that seem to compromise his capacity for sound judgment. He is already on Aricept. Will provide them with education about managing behavioral and psychological symptoms of dementia.   Diagnostic Impressions: Alzheimer's dementia, late onset, with behavioral disturbance  Recommendations to be discussed with patient  Your performance and presentation on assessment were consistent with memory storage problems and difficulties on certain language measures. I also think you are having some more minor difficulties with executive functioning (e.g., reasoning, switching from one thing to the next, insight, problem solving). These test scores in the setting of your clinical history suggest the presence of a dementia   Dementia refers to a group of syndromes where multiple areas of ability are damaged in the brain, such as memory, thinking, judgment, and behavior, and most commonly refers to age related causes of dementia that cause worsening in these abilities over time. Alzheimer's disease is the most common form of dementia in people over the age of 49. Not all dementias are Alzheimer's disease, but all Alzheimer's disease is dementia. When dementia is due to an underlying condition affecting the brain, such as Alzheimer's disease, there is progression over time, which typically proceeds gradually over many years.   In your case, I have a high level of confidence than your condition is due to underlying Alzheimer's disease. This is by far the most common cause of dementia in individuals your age. It is also consistent with your brain imaging, which shows significant and disproportionate volume loss within the mesial temporal lobes. Your test findings are also supportive of Alzheimer's. This means there will be progression over time.  You are already on a good  front-line medication, Aricept, continue to titrate your dose as directed and update Jonathan Harmon regarding your progress.   As with all capable adults, I would recommend that you consult with an elder care attorney regarding advanced directives if you have not, such as a healthcare power of attorney and living will. Consultation with an elder care attorney can help you protect your estate and communicate your preferences to your loved ones formally, in the event you are not able to do so yourself. I can provide my strong recommendation for the [employer], who are located at [address] in [place], [address]. They can be reached at [phone]. They also have a website [email or web address].   Decision making is a complex ability and it is challenging to evaluate. It is generally accepted that there are four primary components of decision making including: 1). The ability to consistently express a preference 2). The ability to understand information pertinent to the decision at hand, including risks and benefits.  3). The ability to reason with that information in accordance with ones own values and circumstances 4). Appreciation for the consequences of the decision as applied to an individuals own unique circumstances.   In your case, I think you have lost the ability to independently make medical and financial decisions of high risk. You have probably lost the ability to make all but very basic personal decisions. A big part of this is lack of insight, individuals are unable to advocate for their needs, work around deficits, and otherwise adapt to disease if they do not think there is a problem. The medical term for this lack of insight is called "anosognosia," which means absence of knowledge of illness. While their failure to recognize what seems obvious to everyone else can be frustrating, it is not their fault and is part of the disease itself. Trying to point out problems to the  unaware individual is usually not helpful and is frequently harmful. What is helpful is to provide reassurance, redirect them, and avoid confronting them directly with the cognitive difficulties they are experiencing.  It is particularly important for individuals with anosognosia to get help planning for medical and other needs because they often are unaware of what their needs are.   Instead of medications, I recommend behavioral strategies for dealing with the behavioral and psychological issues that can accompany dementia. Things like agitation, wandering, and anxiety can often be improved or eliminated using the "three R's." Redirection (help distract your loved one by focusing their attention on something else, moving them to a new environment, or otherwise engaging them in something other than what is distressing to them), Reassurance (reassure them that you are there to take care of them and that there is nothing they need to be worried about), and Reconsidering (consider the situation from their perspective and try to identify if there is something about the situation or environment that may be triggering their reaction).  We can supplement the above information with a handout from the [employer] on managing behavioral and psychological symptoms of dementia, which is much more detailed and often helpful.   The following compensatory strategies may be helpful for managing day-to-day memory symptoms: Minimize distractions and interruptions to the extent possible. Be an active observer, present-minded, and focus attention. Focus on only one task for a period of time.  Get organized. Establish routines and stick to them. Make and use checklists.  Use external  memory aids as needed, such as a Financial risk analyst. Repetition, written reminders, and keeping a calendar of appointments may be helpful. Designate a place to keep your keys, wallet, cell phone, and other personal belongings.  Break  down tasks into smaller steps to help get started and to keep from feeling overwhelmed.  Increase your success learning information by breaking it into manageable chunks, connecting it to previously learned information, or forming associations with what you are trying to remember.   Test Findings  Test scores are summarized in additional documentation associated with this encounter. Test scores are relative to age, gender, and educational history as available and appropriate. There were no concerns about performance validity as all findings fell within normal expectations.   General Intellectual Functioning/Achievement:  Performance was high average on single word reading, which presents as a reasonable standard of comparison for this patient's cognitive test performance.   Attention and Processing Efficiency: Indicators of attention and working memory involving digit repetition were very good with average digit repetition forward and high average digit repetition backwards. He did very well on serial subtraction of 7 from 100, with 5/5 correct in a very short amount of time. Performance on timed number-symbol coding was unusually low, which may suggest some minor difficulties with processing speed and/or psychomotor association learning.   Language: Performance on language measures was notable for very good, nearly errorless visual object confrontation naming. Generation of words in response to letters was low average although his category fluency for the prompt "animals" was extremely low.   Visuospatial Function: Performance was good on measures within this domain with a high average overall score. He scored in the superior range for figure copy, an errorless reproduction. His judgment of angular line orientations was average.   Learning and Memory: Indicators of learning and memory showed marked memory storage problems, with diminished encoding and retention of information across time in both  the visual and verbal domains. The patient was also noted to have rapid forgetting in the clinical encounter, which was quite readily observable. Findings are concerning for a storage problem.  In the verbal realm, his immediate recall was 0, 3, and 5 words of a 12-item word list across three learning trials, which is extremely low. He did not recall any words on short or long delayed recall with extremely low scores on both of those measures. Recognition discrimination was psychometrically unusually low although his absolute performance level is quite low with only 3 correct identifications and 2 false positive errors.   In the visual realm, his performance was similar and extremely low (did not recall a modestly complex figure that he had been asked to copy earlier).   Executive Functions: Performance on executive indicators was low average for the challenging trail making B test. This is surprising given the level of impairment reported by his son and suggests his memory issues may be more focal than sometimes seen, which would certainly go along with his imaging. Nevertheless, reasoning was not good, with an extremely low score on the Complex Ideational Material. Clock drawing was WNL with reasonable face formation, numbering, and hand placement.    Rating Scale(s): Jonathan Harmon denied any significant depressive issues on the GDS. I was able to rate a CDR for him and would place him in the early moderate range.   Jonathan Harmon Jonathan Reno PsyD, ABN Clinical Neuropsychologist  Coding and Compliance  Billing below reflects technician time, my direct face-to-face time with the patient, time spent in test administration,  and time spent in professional activities including but not limited to: neuropsychological test interpretation, integration of neuropsychological test data with clinical history, report preparation, treatment planning, care coordination, and review of diagnostically pertinent medical history or  studies.   Services associated with this encounter: Clinical Interview [phone]) plus 125 minutes (96132/96133; Neuropsychological Evaluation by Professional)  20 minutes (96136/96137; Test Administration by Professional) 75 minutes (96138/96139; Neuropsychological Testing by Technician)

## 2020-10-21 ENCOUNTER — Ambulatory Visit (INDEPENDENT_AMBULATORY_CARE_PROVIDER_SITE_OTHER): Payer: Medicare Other | Admitting: Counselor

## 2020-10-21 ENCOUNTER — Other Ambulatory Visit: Payer: Self-pay

## 2020-10-21 ENCOUNTER — Encounter: Payer: Self-pay | Admitting: Counselor

## 2020-10-21 DIAGNOSIS — F0281 Dementia in other diseases classified elsewhere with behavioral disturbance: Secondary | ICD-10-CM

## 2020-10-21 DIAGNOSIS — G301 Alzheimer's disease with late onset: Secondary | ICD-10-CM

## 2020-10-21 DIAGNOSIS — F02818 Dementia in other diseases classified elsewhere, unspecified severity, with other behavioral disturbance: Secondary | ICD-10-CM

## 2020-10-21 NOTE — Progress Notes (Signed)
   NEUROPSYCHOLOGY FEEDBACK NOTE [facility]  Feedback Note: I met with Jonathan Harmon to review the findings resulting from his neuropsychological evaluation. Since the last appointment, he has been about the same. Time was spent reviewing the impressions and recommendations that are detailed in the evaluation report. We discussed impression of early moderate stage dementia, as reflected in the patient instructions. I provided them with recommendations regarding managing psychological and behavioral symptoms of dementia, using the three R's, and that was supplemented with a handout from the [employer]. I took time to explain the findings and answer all the patient's questions. I encouraged Jonathan Harmon to contact me should he have any further questions or if further follow up is desired.   Current Medications and Medical History   Current Outpatient Medications  Medication Sig Dispense Refill   albuterol (VENTOLIN HFA) 108 (90 Base) MCG/ACT inhaler inhale 2 puffs by mouth into the lungs every 6 hours as needed for wheezing and shortness of breath 8.5 g 11   atorvastatin (LIPITOR) 40 MG tablet TAKE 1 TABLET BY MOUTH ONCE DAILY (Patient taking differently: Take 40 mg by mouth daily.) 30 tablet 3   donepezil (ARICEPT) 10 MG tablet take 40m daily (1/2 tab) by mouth  for 2 weeks, then  increase the dose to 1 tablet  (1789m daily (Patient taking differently: take 89m42maily (1/2 tab) by mouth  for 2 weeks, then  increase the dose to 1 tablet  (48m27maily) 30 tablet 11   Tiotropium Bromide-Olodaterol (STIOLTO RESPIMAT) 2.5-2.5 MCG/ACT AERS Inhale 1 puff into the lungs daily. 4 g 11   No current facility-administered medications for this visit.    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 Speech was normal in rate, rhythm, volume, and prosody. Self-reported mood was "good" and affect was euthymic. Thought process was logical and goal oriented and thought content was appropriate to the topics discussed. There were no safety concerns identified at today's encounter, such as thoughts of harming self or others.   Plan  Feedback provided regarding the patient's neuropsychological evaluation. His son is involved and presents as a very capable caregiver. I provided them with a referral to elder law firm, because his son could use legal guidance in terms of managing his assets and the like. Jonathan Harmon encouraged to contact me if any questions arise or if further follow up is desired.   Jonathan SimaswNicole KindredyD, ABN Clinical Neuropsychologist  Service(s) Provided at This Encounter: 52 m48utes ([phone] therapy with patient present)

## 2020-10-21 NOTE — Patient Instructions (Addendum)
Your performance and presentation on assessment were consistent with memory storage problems and difficulties on certain language measures. I also think you are having some more minor difficulties with executive functioning (e.g., reasoning, switching from one thing to the next, insight, problem solving). These test scores in the setting of your clinical history suggest the presence of a dementia   Dementia refers to a group of syndromes where multiple areas of ability are damaged in the brain, such as memory, thinking, judgment, and behavior, and most commonly refers to age related causes of dementia that cause worsening in these abilities over time. Alzheimer's disease is the most common form of dementia in people over the age of 53. Not all dementias are Alzheimer's disease, but all Alzheimer's disease is dementia. When dementia is due to an underlying condition affecting the brain, such as Alzheimer's disease, there is progression over time, which typically proceeds gradually over many years.   In your case, I have a high level of confidence than your condition is due to underlying Alzheimer's disease. This is by far the most common cause of dementia in individuals your age. It is also consistent with your brain imaging, which shows significant and disproportionate volume loss within the mesial temporal lobes. Your test findings are also supportive of Alzheimer's. This means there will be progression over time.   You are already on a good front-line medication, Aricept, continue to titrate your dose as directed and update Jonathan Harmon regarding your progress.   As with all capable adults, I would recommend that you consult with an elder care attorney regarding advanced directives if you have not, such as a healthcare power of attorney and living will. Consultation with an elder care attorney can help you protect your estate and communicate your preferences to your loved ones formally, in the event you are  not able to do so yourself. I can provide my strong recommendation for the [employer], who are located at [address] in [place], [address]. They can be reached at [phone]. They also have a website [email or web address].   Decision making is a complex ability and it is challenging to evaluate. It is generally accepted that there are four primary components of decision making including: 1). The ability to consistently express a preference 2). The ability to understand information pertinent to the decision at hand, including risks and benefits. 3). The ability to reason with that information in accordance with ones own values and circumstances 4). Appreciation for the consequences of the decision as applied to an individuals own unique circumstances.   In your case, I think you have lost the ability to independently make medical and financial decisions of high risk. You have probably lost the ability to make all but very basic personal decisions. A big part of this is lack of insight, individuals are unable to advocate for their needs, work around deficits, and otherwise adapt to disease if they do not think there is a problem. The medical term for this lack of insight is called "anosognosia," which means absence of knowledge of illness. While their failure to recognize what seems obvious to everyone else can be frustrating, it is not their fault and is part of the disease itself. Trying to point out problems to the unaware individual is usually not helpful and is frequently harmful. What is helpful is to provide reassurance, redirect them, and avoid confronting them directly with the cognitive difficulties they are experiencing.  It is particularly important  for individuals with anosognosia to get help planning for medical and other needs because they often are unaware of what their needs are.  Instead of medications, I recommend behavioral strategies for dealing with the behavioral and  psychological issues that can accompany dementia. Things like agitation, wandering, and anxiety can often be improved or eliminated using the "three R's." Redirection (help distract your loved one by focusing their attention on something else, moving them to a new environment, or otherwise engaging them in something other than what is distressing to them), Reassurance (reassure them that you are there to take care of them and that there is nothing they need to be worried about), and Reconsidering (consider the situation from their perspective and try to identify if there is something about the situation or environment that may be triggering their reaction).   We can supplement the above information with a handout from the [employer] on managing behavioral and psychological symptoms of dementia, which is much more detailed and often helpful.   The following compensatory strategies may be helpful for managing day-to-day memory symptoms: Minimize distractions and interruptions to the extent possible. Be an active observer, present-minded, and focus attention. Focus on only one task for a period of time. Get organized. Establish routines and stick to them. Make and use checklists. Use external memory aids as needed, such as a planner and notebook. Repetition, written reminders, and keeping a calendar of appointments may be helpful. Designate a place to keep your keys, wallet, cell phone, and other personal belongings. Break down tasks into smaller steps to help get started and to keep from feeling overwhelmed. Increase your success learning information by breaking it into manageable chunks, connecting it to previously learned information, or forming associations with what you are trying to remember.  We discussed staging of dementia. I use the Clinical Dementia Rating, which is an internationally recognized gold-standard measure. Jonathan Harmon is currently in the early moderate stage, although one could  make a case potentially for a late mild stage as well.   Dementia is (somewhat arbitrarily) divided up into three different stages: Mild, Moderate, and Severe. These stages are not so much discrete stages as they are points on a continuum of severity. These stages govern the types of behavior you can expect, the level of care someone needs, and necessary supports. In the mild stages, difficulties are typically limited to cognitive problems, such as forgetfulness or difficulties coming up with the right word. Individuals are often able to remain at home, safely operate a motor vehicle, and changes may not be obvious to the casual observer. They may have difficulties with more complex things such as managing finances, with driving, and with work if they are still working. These individuals are often capable of living independently with minimal assistance, but do require some supervision with more complex tasks such as managing finances, arranging and remembering to take medications, and reporting symptoms at doctors appointments. In the moderate stage, individuals with dementia eventually begin to have increasing difficulties with more basic activities (e.g., light housework, assembling a balanced meal, caring for personal hygiene and appearance). Individuals in the moderate stages also sometimes develop delusions (I.e., false beliefs), such as thinking that they have been visited by family members who are deceased, that their house is not their home, and the like. Individuals at this level of progression are not safe to operate a motor vehicle, should not be significantly involved in financial or medical decisions other than expressing their preferences, and  should not be left alone for extended periods of time. They should not be left alone at all if there are problematic behaviors such as wandering. Individuals in the severe stage require extensive care and assistance, such as around the clock supervision or a  skilled nursing level facility.

## 2020-11-04 NOTE — Progress Notes (Addendum)
Assessment/Plan:     Late Onset  Alzheimer's Dementia with behavioral disturbance  Patient had a neurocognitive exam on October 20, 2020, yielding the above diagnosis. MRI brain reviewed with patient and son today. He is tolerating Donepezil 10 mg daily without any side effects.  Recommendations  Discussed the diagnosis of dementia, likely due to Alzheimer's disease. Discussed safety both in and out of the home.  Provided the number for assessment of decision-making capacity, Dr. Erick Blinks [phone] Discussed the importance of regular daily schedule to maintain brain function.  Continue to monitor mood. Say active exercising  at least 30 minutes at least 3 times a week.  Naps should be scheduled and should be no longer than 60 minutes and should not occur after 2 PM.  Continue donepezil 10 mg daily. Side effects were discussed  Follow up in  6 months.   Case discussed with Dr. Karel Jarvis who agrees with the plan    Subjective:    ED visits: none Hospital admissions: none   Brannon Levene  is a 87 y.o.male seen today in follow up for memory loss.  This patient is accompanied in the office by his son who supplements the history.  Previous records as well as any outside records available were reviewed prior to todays visit.  Patient is currently on donepezil 10 mg daily tolerating the medicine well. The patient feels that his memory is okay, at times a little worse.  His son feels that there might be a slight decline since his last visit.  He continues to have trouble with retrieving words, especially with short-term memory, and to some degree with long-term memory, unable to remember names of family members that he knew throughout his life.  He asked his son for example "when did you get married, where I grew up?". His mood however is good, without depression or irritability.  Denies hallucinations or paranoia.  Sleeps 8 to 9 hours at night, occasionally taking a nap while watching  TV.  He likes to read, playing cards on the computer, and watching significant amount of TV.  He also walks about 1-1/2 mile with his son.  He repeated several times during this visit, the distance walked.  He denies vivid dreams or sleepwalking.  He dresses up and bathes without help.  His son reports that he lately wants to wear the same clothing on a daily basis, he has to force him to change them.  He does not mind wearing clothing with stains on it, while years ago this would have been unacceptable.  His son states that he makes him believe that he does the finances alone, but he does supervise every single check.  The patient does like to talk about the stock market, his son reports that this has not changed.  He ambulates without difficulty, without a walker or a cane.  He continues to drive short distances, within a 3 mile radius, without GPS.  He denies getting lost.  His son agrees that he is still doing it without difficulty, however he is considering having him "taken the keys away if the memory gets worse and now ".  He denies any headaches, trauma or injuries to the head, double vision, dizziness, focal numbness or tingling, unilateral weakness or tremors.  Denies any urine incontinence or retention, denies constipation or diarrhea   INITIAL EVALUATION 09/14/20 The patient is seen in neurologic consultation at the request of Saguier, Ramon Dredge, PA-C for the evaluation of memory.  The patient  is accompanied by his Son Jorja Loaim, who supplements the history.  The patient is a 87 y.o. year old right-handed male who has had memory issues for about  3-4 years. He was seen [facility] in 02/2018 at which time he had a Neurpsych evaluation, confirming the presence of Mild Neurocognitive Disorder likely due to Alzheimer's Disease. "Patient states that he did go to the neuro psych appointment but he apparently "purposely antagonize the tester so he does not feel he got a fair shake as far as his  evaluation" and he did not want further appts. his primary provider at the time did not feel starting him on medication such as Aricept, as [place] was moving [place].  [place] live with [place], he reported a cognitive decline to his PCP, MMSE was 20/30. He was referred to us for further work-up.  Patient feels that memory is "good at times, but he may be getting worse", Son feels that he has definite trouble with retrieving words, especially with short-term memory, and to some degree with long-term memory, unable to remember names of family members that he knew throughout his life. Mood is good, without depression or irritability. Denies hallucinations or paranoia. Sleeps 8 to 9 hours a night, occasionally takes a nap while watching TV.  He likes to read, playing hearts on the computer, watching a lot of TV and walking with his son daily. Denies vivid dreams or sleepwalking. Patient dresses up and bathes without help.  Denies missing medications and uses a pillbox.   Denies living objects in unusual places. Patient's does his own finances with his sons supervision.  Ambulating without difficulty without walker or cane. Patient continues to drive and denies getting lost.  Denies headaches, trauma, or injuries to the head, double vision,  dizziness, focal numbness or tingling, unilateral weakness or tremors. Denies urine incontinence or retention. Denies constipation or diarrhea. Denies  ETOH or tobacco use.  Family History remarkable for mother having been diagnosed with Alzheimer's disease, died at age 85. He is widower and has 2 children.     Neuropsychological assessment 10/14/20 Dr. Roseanne RenoStewart " demonstrates memory storage problems and diminished semantic as compared to phonemic fluency, which are findings often observed in the setting of Alzheimer's clinical syndrome. He does not have naming impairment. Performance was reasonable in all other domains, although he did  have scattered low scores on measures relevant to executive functioning including timed number-symbol coding and reasoning with verbal information. He screened negative for the presence of depression and he falls in the moderate dementia range on the CDR.   Mr. Lourdes SledgeKnorr is thus demonstrating a clinical dementia syndrome with a high index of suspicion for Alzheimer's disease given demographics, supportive structural imaging and neuropsychological test data. I think that his recent worsening is the result of his pneumonia and medical illness and he may realize some improvement or at least stabilization now that he is convalescing. He is most certainly not capable of making medical and financial decisions of high risk, in my opinion, given very significant anosognosia and substantial cognitive impairments that seem to compromise his capacity for sound judgment. He is already on Aricept"   PREVIOUS MEDICATIONS:   CURRENT MEDICATIONS:  Outpatient Encounter Medications as of 11/05/2020  Medication Sig   albuterol (VENTOLIN HFA) 108 (90 Base) MCG/ACT inhaler inhale 2 puffs by mouth into the lungs every 6 hours as needed for wheezing and shortness of  breath   donepezil (ARICEPT) 10 MG tablet take 5mg  daily (1/2 tab) by mouth  for 2 weeks, then  increase the dose to 1 tablet  (10mg ) daily (Patient taking differently: take 5mg  daily (1/2 tab) by mouth  for 2 weeks, then  increase the dose to 1 tablet  (10mg ) daily)   Tiotropium Bromide-Olodaterol (STIOLTO RESPIMAT) 2.5-2.5 MCG/ACT AERS Inhale 1 puff into the lungs daily.   atorvastatin (LIPITOR) 40 MG tablet TAKE 1 TABLET BY MOUTH ONCE DAILY (Patient not taking: Reported on 11/05/2020)   No facility-administered encounter medications on file as of 11/05/2020.     Objective:     PHYSICAL EXAMINATION:    VITALS:   Vitals:   11/05/20 1458  BP: 105/67  Pulse: 75  Resp: 18  SpO2: 94%  Height: 5\' 7"  (1.702 m)    GEN:  The patient appears stated age and  is in NAD. Very thin HEENT:  Normocephalic, atraumatic.   Neurological examination:  General: NAD, well-groomed, appears stated age. Orientation: alert and oriented to person, place and time.   Cranial nerves: There is good facial symmetry.The speech is fluent and clear. No aphasia or dysarthria. Fund of knowledge is reduced. Recent and remote memory are impaired. Attention and concentration are normal. Able to name objects and repeat phrases.  Hearing is intact to conversational tone.    Sensation: Sensation is intact to light touch throughout Motor: Strength is at least antigravity x4.  Montreal Cognitive Assessment  09/14/2020  Visuospatial/ Executive (0/5) 2  Naming (0/3) 2  Attention: Read list of digits (0/2) 1  Attention: Read list of letters (0/1) 1  Attention: Serial 7 subtraction starting at 100 (0/3) 3  Language: Repeat phrase (0/2) 0  Language : Fluency (0/1) 1  Abstraction (0/2) 2  Delayed Recall (0/5) 0  Orientation (0/6) 2  Total 14     MMSE - Mini Mental State Exam 10/14/2020  Orientation to time 1  Orientation to Place 1  Registration 3  Attention/ Calculation 5  Recall 0  Language- name 2 objects 2  Language- repeat 0  Language- follow 3 step command 3  Language- read & follow direction 1  Write a sentence 1  Copy design 1  Total score 18      Movement examination: Tone: There is normal tone in the UE/LE Abnormal movements:  no tremor.  No myoclonus.  No asterixis.   Coordination:  There is no decremation with RAM's. Normal finger to nose  Gait and Station: The patient has no difficulty arising out of a deep-seated chair without the use of the hands. The patient's stride length is good.  Gait is cautious and narrow.    CBC CBC Latest Ref Rng & Units 08/24/2020 08/12/2020 08/11/2020  WBC 4.0 - 10.5 K/uL 13.9(H) 16.4(H) 16.8(H)  Hemoglobin 13.0 - 17.0 g/dL 11.8(L) 11.3(L) 11.4(L)  Hematocrit 39.0 - 52.0 % 35.4(L) 33.8(L) 34.6(L)  Platelets 150.0 - 400.0  K/uL 506.0(H) 205 231     CMP Latest Ref Rng & Units 08/24/2020 08/12/2020 08/11/2020  Glucose 70 - 99 mg/dL 08/14/2020) 94 08/13/2020)  BUN 6 - 23 mg/dL 23 18 19   Creatinine 0.40 - 1.50 mg/dL 10/24/2020 08/14/2020 08/13/2020  Sodium 135 - 145 mEq/L 136 133(L) 134(L)  Potassium 3.5 - 5.1 mEq/L 5.1 4.1 4.3  Chloride 96 - 112 mEq/L 98 99 100  CO2 19 - 32 mEq/L 29 27 28   Calcium 8.4 - 10.5 mg/dL 9.2 024(O) 973(Z)  Total Protein 6.0 - 8.3  g/dL 7.5 6.1(L) -  Total Bilirubin 0.2 - 1.2 mg/dL 0.5 0.6 -  Alkaline Phos 39 - 117 U/L 92 81 -  AST 0 - 37 U/L 17 24 -  ALT 0 - 53 U/L 15 11 -       Total time spent on today's visit was 40  minutes, including both face-to-face time and nonface-to-face time.  Time included that spent on review of records (prior notes available to me/labs/imaging if pertinent), discussing treatment and goals, answering patient's questions and coordinating care.  Cc:  Saguier, Ramon Dredge, PA-C Marlowe Kays, PA-C

## 2020-11-05 ENCOUNTER — Other Ambulatory Visit (HOSPITAL_BASED_OUTPATIENT_CLINIC_OR_DEPARTMENT_OTHER): Payer: Self-pay

## 2020-11-05 ENCOUNTER — Ambulatory Visit (INDEPENDENT_AMBULATORY_CARE_PROVIDER_SITE_OTHER): Payer: Medicare Other | Admitting: Physician Assistant

## 2020-11-05 ENCOUNTER — Encounter: Payer: Self-pay | Admitting: Physician Assistant

## 2020-11-05 ENCOUNTER — Other Ambulatory Visit: Payer: Self-pay

## 2020-11-05 VITALS — BP 105/67 | HR 75 | Resp 18 | Ht 67.0 in

## 2020-11-05 DIAGNOSIS — F0281 Dementia in other diseases classified elsewhere with behavioral disturbance: Secondary | ICD-10-CM | POA: Diagnosis not present

## 2020-11-05 DIAGNOSIS — G301 Alzheimer's disease with late onset: Secondary | ICD-10-CM | POA: Diagnosis not present

## 2020-11-05 MED ORDER — DONEPEZIL HCL 10 MG PO TABS
ORAL_TABLET | ORAL | 11 refills | Status: DC
  Filled 2020-11-05: qty 30, 30d supply, fill #0

## 2020-11-05 NOTE — Patient Instructions (Signed)
It was a pleasure to see you today at our office.   Recommendations:  Meds: Follow up in  6 months Continue donepezil 10 mg daily.   Call  Dr. Erick Blinks to help with Assessment of Decision Making Capacity [phone]   RECOMMENDATIONS FOR ALL PATIENTS WITH MEMORY PROBLEMS: 1. Continue to exercise (Recommend 30 minutes of walking everyday, or 3 hours every week) 2. Increase social interactions - continue going to [place] and enjoy social gatherings with friends and family 3. Eat healthy, avoid fried foods and eat more fruits and vegetables 4. Maintain adequate blood pressure, blood sugar, and blood cholesterol level. Reducing the risk of stroke and cardiovascular disease also helps promoting better memory. 5. Avoid stressful situations. Live a simple life and avoid aggravations. Organize your time and prepare for the next day in anticipation. 6. Sleep well, avoid any interruptions of sleep and avoid any distractions in the bedroom that may interfere with adequate sleep quality 7. Avoid sugar, avoid sweets as there is a strong link between excessive sugar intake, diabetes, and cognitive impairment We discussed the Mediterranean diet, which has been shown to help patients reduce the risk of progressive memory disorders and reduces cardiovascular risk. This includes eating fish, eat fruits and green leafy vegetables, nuts like almonds and hazelnuts, walnuts, and also use olive oil. Avoid fast foods and fried foods as much as possible. Avoid sweets and sugar as sugar use has been linked to worsening of memory function.  There is always a concern of gradual progression of memory problems. If this is the case, then we may need to adjust level of care according to patient needs. Support, both to the patient and caregiver, should then be put into place.    The Alzheimer's Association is here all day, every day for people facing Alzheimer's disease through our free 24/7 Helpline: [phone]. The  Helpline provides reliable information and support to all those who need assistance, such as individuals living with memory loss, Alzheimer's or other dementia, caregivers, health care professionals and the public.  Our highly trained and knowledgeable staff can help you with: Understanding memory loss, dementia and Alzheimer's  Medications and other treatment options  General information about aging and brain health  Skills to provide quality care and to find the best care from professionals  Legal, financial and living-arrangement decisions Our Helpline also features: Confidential care consultation provided by master's level clinicians who can help with decision-making support, crisis assistance and education on issues families face every day  Help in a caller's preferred language using our translation service that features more than 200 languages and dialects  Referrals to local community programs, services and ongoing support     FALL PRECAUTIONS: Be cautious when walking. Scan the area for obstacles that may increase the risk of trips and falls. When getting up in the mornings, sit up at the edge of the bed for a few minutes before getting out of bed. Consider elevating the bed at the head end to avoid drop of blood pressure when getting up. Walk always in a well-lit room (use night lights in the walls). Avoid area rugs or power cords from appliances in the middle of the walkways. Use a walker or a cane if necessary and consider physical therapy for balance exercise. Get your eyesight checked regularly.  FINANCIAL OVERSIGHT: Supervision, especially oversight when making financial decisions or transactions is also recommended.  HOME SAFETY: Consider the safety of the kitchen when operating appliances like stoves, microwave oven, and  blender. Consider having supervision and share cooking responsibilities until no longer able to participate in those. Accidents with firearms and other hazards in  the house should be identified and addressed as well.   ABILITY TO BE LEFT ALONE: If patient is unable to contact 911 operator, consider using LifeLine, or when the need is there, arrange for someone to stay with patients. Smoking is a fire hazard, consider supervision or cessation. Risk of wandering should be assessed by caregiver and if detected at any point, supervision and safe proof recommendations should be instituted.  MEDICATION SUPERVISION: Inability to self-administer medication needs to be constantly addressed. Implement a mechanism to ensure safe administration of the medications.   DRIVING: Regarding driving, in patients with progressive memory problems, driving will be impaired. We advise to have someone else do the driving if trouble finding directions or if minor accidents are reported. Independent driving assessment is available to determine safety of driving.   If you are interested in the driving assessment, you can contact the following:  [facility] [employer] [facility] in [place] [phone]  [facility] [phone]  [facility] [phone] or [phone]      Mediterranean Diet A Mediterranean diet refers to food and lifestyle choices that are based on the traditions of countries located on the [employer]. This way of eating has been shown to help prevent certain conditions and improve outcomes for people who have chronic diseases, like kidney disease and heart disease. What are tips for following this plan? Lifestyle  Cook and eat meals together with your family, when possible. Drink enough fluid to keep your urine clear or pale yellow. Be physically active every day. This includes: Aerobic exercise like running or swimming. Leisure activities like gardening, walking, or housework. Get 7-8 hours of sleep each night. If recommended by your health care provider, drink red wine in moderation. This  means 1 glass a day for nonpregnant women and 2 glasses a day for men. A glass of wine equals 5 oz (150 mL). Reading food labels  Check the serving size of packaged foods. For foods such as rice and pasta, the serving size refers to the amount of cooked product, not dry. Check the total fat in packaged foods. Avoid foods that have saturated fat or trans fats. Check the ingredients list for added sugars, such as corn syrup. Shopping  At the grocery store, buy most of your food from the areas near the walls of the store. This includes: Fresh fruits and vegetables (produce). Grains, beans, nuts, and seeds. Some of these may be available in unpackaged forms or large amounts (in bulk). Fresh seafood. Poultry and eggs. Low-fat dairy products. Buy whole ingredients instead of prepackaged foods. Buy fresh fruits and vegetables in-season from local farmers markets. Buy frozen fruits and vegetables in resealable bags. If you do not have access to quality fresh seafood, buy precooked frozen shrimp or canned fish, such as tuna, salmon, or sardines. Buy small amounts of raw or cooked vegetables, salads, or olives from the deli or salad bar at your store. Stock your pantry so you always have certain foods on hand, such as olive oil, canned tuna, canned tomatoes, rice, pasta, and beans. Cooking  Cook foods with extra-virgin olive oil instead of using butter or other vegetable oils. Have meat as a side dish, and have vegetables or grains as your main dish. This means having meat in small portions or adding small amounts of meat to foods like  pasta or stew. Use beans or vegetables instead of meat in common dishes like chili or lasagna. Experiment with different cooking methods. Try roasting or broiling vegetables instead of steaming or sauteing them. Add frozen vegetables to soups, stews, pasta, or rice. Add nuts or seeds for added healthy fat at each meal. You can add these to yogurt, salads, or vegetable  dishes. Marinate fish or vegetables using olive oil, lemon juice, garlic, and fresh herbs. Meal planning  Plan to eat 1 vegetarian meal one day each week. Try to work up to 2 vegetarian meals, if possible. Eat seafood 2 or more times a week. Have healthy snacks readily available, such as: Vegetable sticks with hummus. Greek yogurt. Fruit and nut trail mix. Eat balanced meals throughout the week. This includes: Fruit: 2-3 servings a day Vegetables: 4-5 servings a day Low-fat dairy: 2 servings a day Fish, poultry, or lean meat: 1 serving a day Beans and legumes: 2 or more servings a week Nuts and seeds: 1-2 servings a day Whole grains: 6-8 servings a day Extra-virgin olive oil: 3-4 servings a day Limit red meat and sweets to only a few servings a month What are my food choices? Mediterranean diet Recommended Grains: Whole-grain pasta. Brown rice. Bulgar wheat. Polenta. Couscous. Whole-wheat bread. Orpah Cobb. Vegetables: Artichokes. Beets. Broccoli. Cabbage. Carrots. Eggplant. Green beans. Chard. Kale. Spinach. Onions. Leeks. Peas. Squash. Tomatoes. Peppers. Radishes. Fruits: Apples. Apricots. Avocado. Berries. Bananas. Cherries. Dates. Figs. Grapes. Lemons. Melon. Oranges. Peaches. Plums. Pomegranate. Meats and other protein foods: Beans. Almonds. Sunflower seeds. Pine nuts. Peanuts. Cod. Salmon. Scallops. Shrimp. Tuna. Tilapia. Clams. Oysters. Eggs. Dairy: Low-fat milk. Cheese. Greek yogurt. Beverages: Water. Red wine. Herbal tea. Fats and oils: Extra virgin olive oil. Avocado oil. Grape seed oil. Sweets and desserts: [place] yogurt with honey. Baked apples. Poached pears. Trail mix. Seasoning and other foods: Basil. Cilantro. Coriander. Cumin. Mint. Parsley. Sage. Rosemary. Tarragon. Garlic. Oregano. Thyme. Pepper. Balsalmic vinegar. Tahini. Hummus. Tomato sauce. Olives. Mushrooms. Limit these Grains: Prepackaged pasta or rice dishes. Prepackaged cereal with added  sugar. Vegetables: Deep fried potatoes (french fries). Fruits: Fruit canned in syrup. Meats and other protein foods: Beef. Pork. Lamb. Poultry with skin. Hot dogs. Tomasa Blase. Dairy: Ice cream. Sour cream. Whole milk. Beverages: Juice. Sugar-sweetened soft drinks. Beer. Liquor and spirits. Fats and oils: Butter. Canola oil. Vegetable oil. Beef fat (tallow). Lard. Sweets and desserts: Cookies. Cakes. Pies. Candy. Seasoning and other foods: Mayonnaise. Premade sauces and marinades. The items listed may not be a complete list. Talk with your dietitian about what dietary choices are right for you. Summary The Mediterranean diet includes both food and lifestyle choices. Eat a variety of fresh fruits and vegetables, beans, nuts, seeds, and whole grains. Limit the amount of red meat and sweets that you eat. Talk with your health care provider about whether it is safe for you to drink red wine in moderation. This means 1 glass a day for nonpregnant women and 2 glasses a day for men. A glass of wine equals 5 oz (150 mL). This information is not intended to replace advice given to you by your health care provider. Make sure you discuss any questions you have with your health care provider. Document Released: 12/03/2015 Document Revised: 01/05/2016 Document Reviewed: 12/03/2015 Elsevier Interactive Patient Education  2017 [employer].

## 2020-11-18 ENCOUNTER — Other Ambulatory Visit (HOSPITAL_BASED_OUTPATIENT_CLINIC_OR_DEPARTMENT_OTHER): Payer: Self-pay

## 2020-11-24 ENCOUNTER — Encounter: Payer: Self-pay | Admitting: Medical

## 2020-11-24 IMAGING — CT CT CHEST HIGH RESOLUTION WITHOUT CONTRAST
2 of 7 series · 15 of 36 positions shown, 18 images · non-contrast
Comparison: 09/06/2018, 12/06/2015

CLINICAL DATA: Pleural plaques, asbestos exposure, emphysema,
chronic cough

EXAM:
CT CHEST WITHOUT CONTRAST
TECHNIQUE: Multidetector CT imaging of the chest was performed following the
standard protocol without intravenous contrast. High resolution
imaging of the lungs, as well as inspiratory and expiratory imaging,
was performed.

[Series 2: thorax · axial · 0.74mm/px · z∈[-336,-30]mm · 12 of 171 slices shown, 15 images]
[im 9/171  mediastinal]
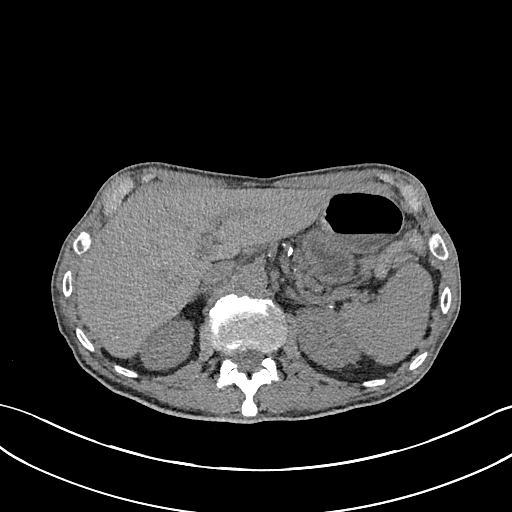
[im 9/171  lung]
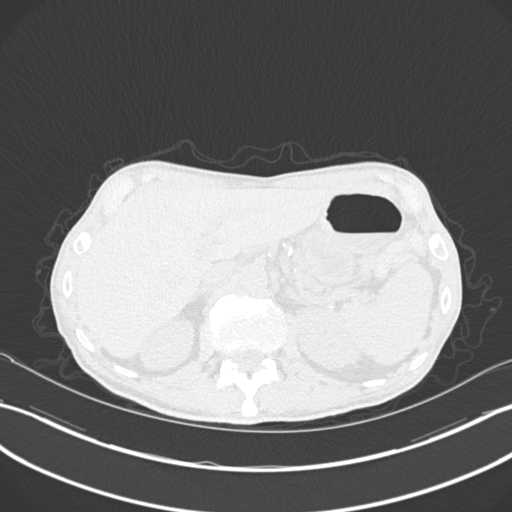
[im 27/171  lung]
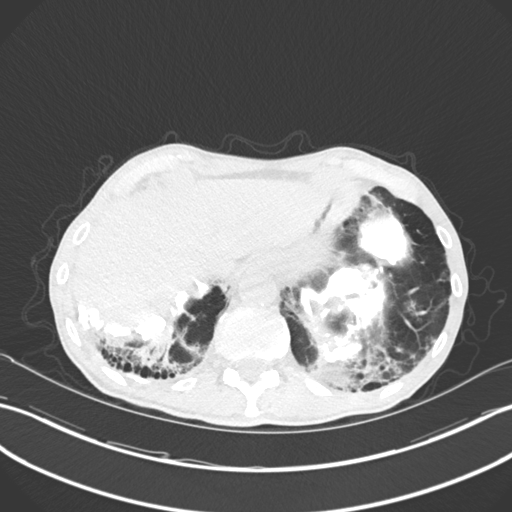
[im 36/171  lung]
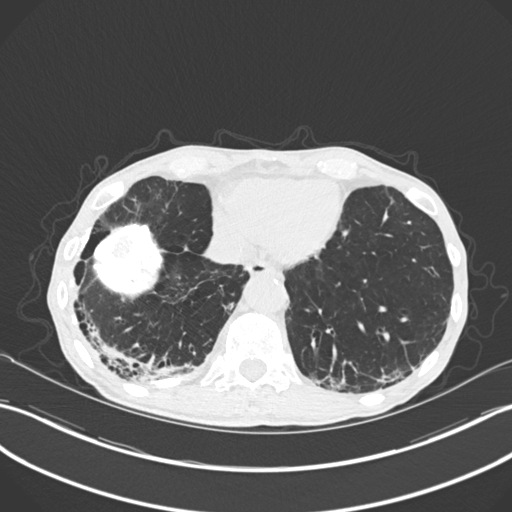
[im 54/171  lung]
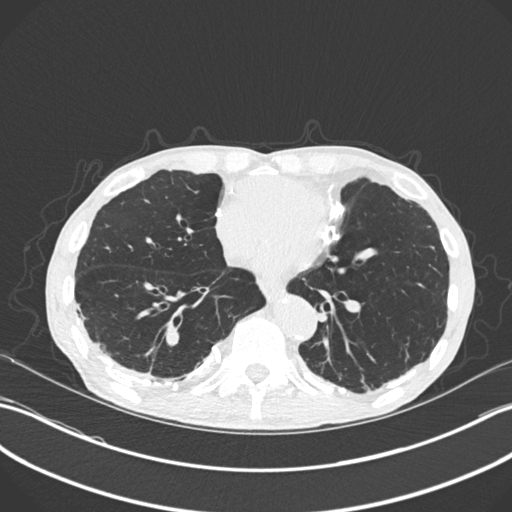
[im 63/171  mediastinal]
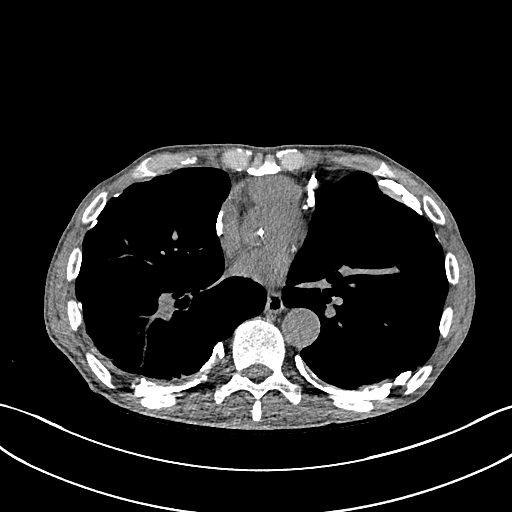
[im 63/171  lung]
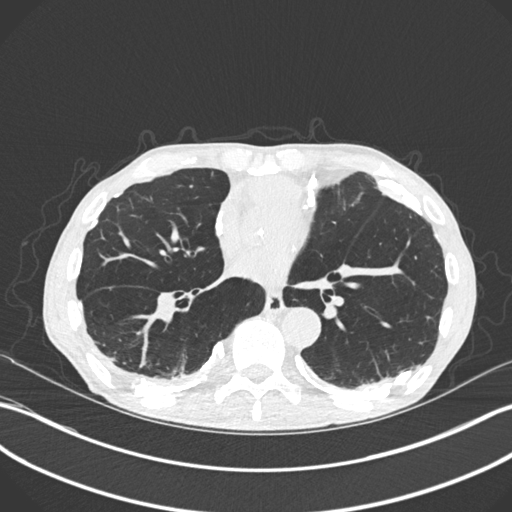
[im 81/171  lung]
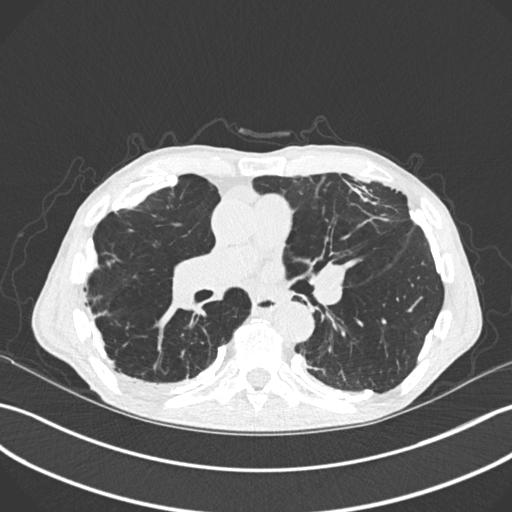
[im 90/171  lung]
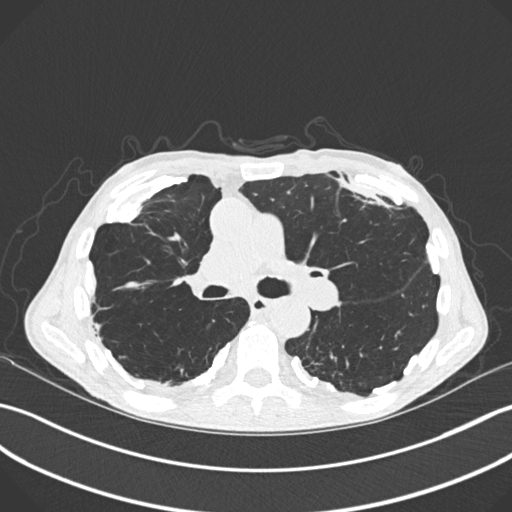
[im 108/171  lung]
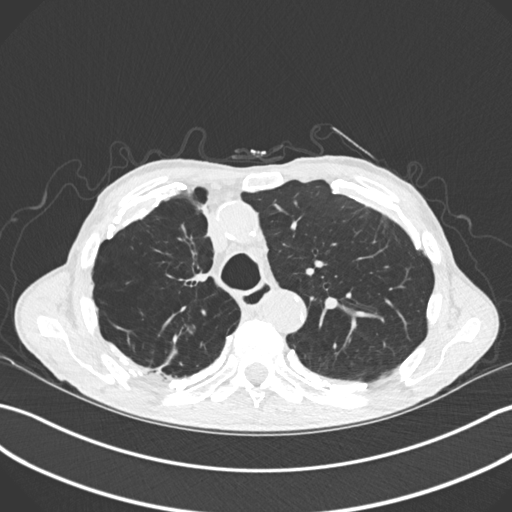
[im 117/171  mediastinal]
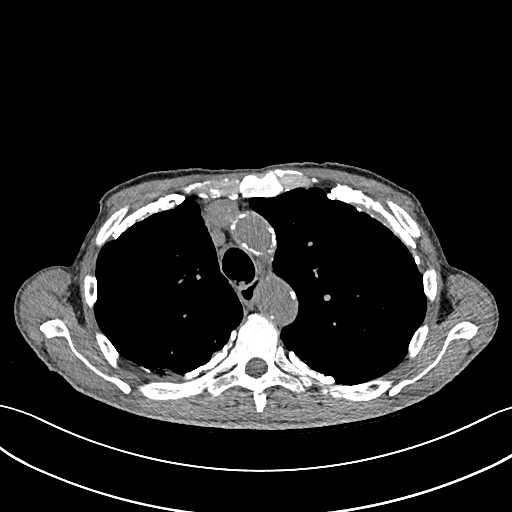
[im 117/171  lung]
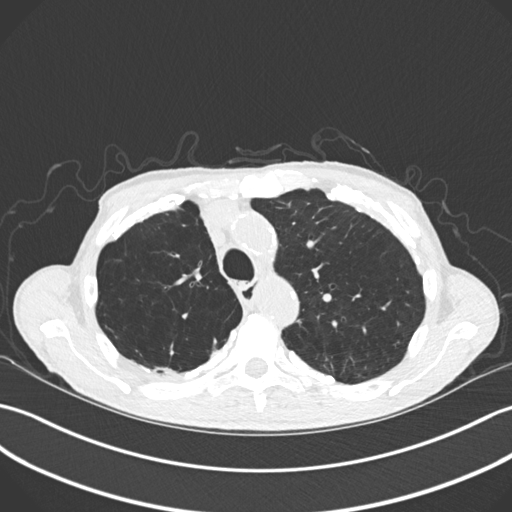
[im 135/171  lung]
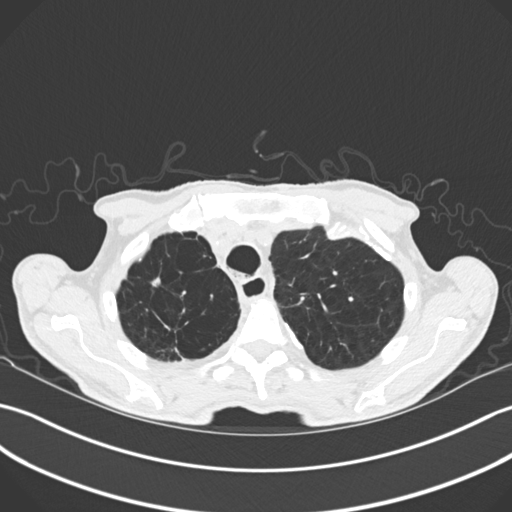
[im 144/171  lung]
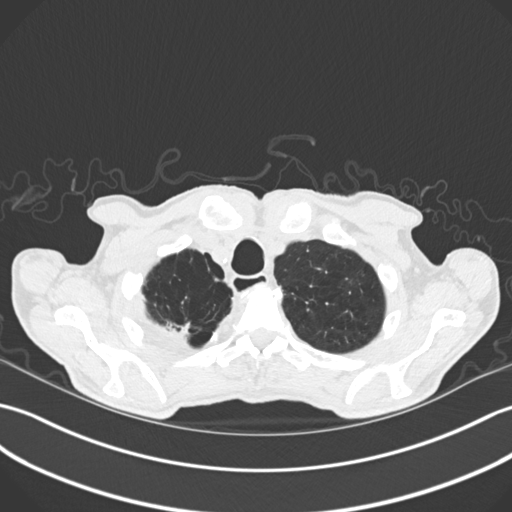
[im 162/171  lung]
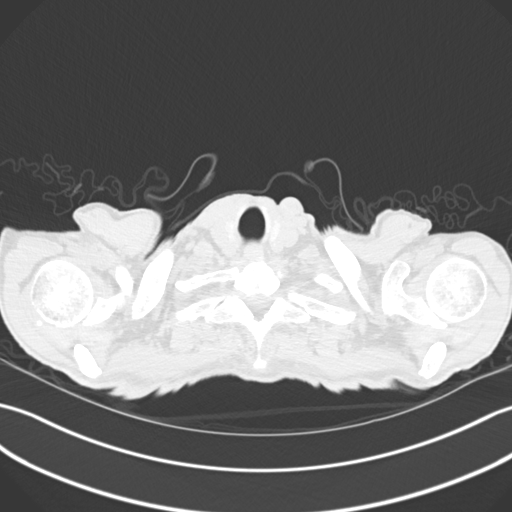

[Series 11: coronal · coronal · 0.68mm/px · 3 of 106 slices shown]
[im 22/106  lung]
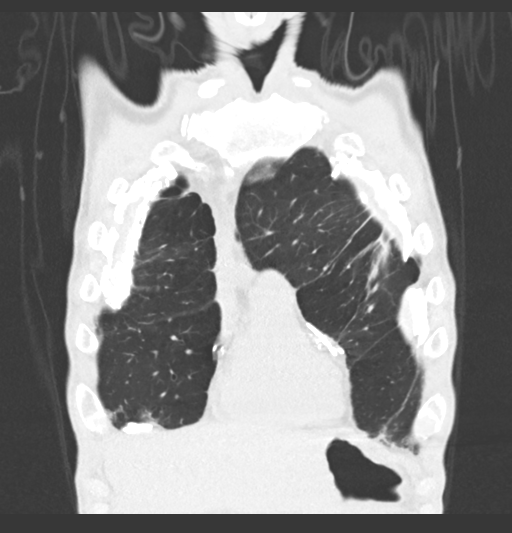
[im 43/106  lung]
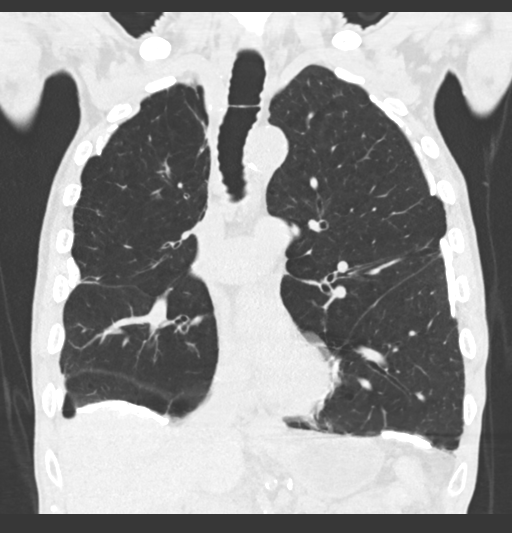
[im 64/106  lung]
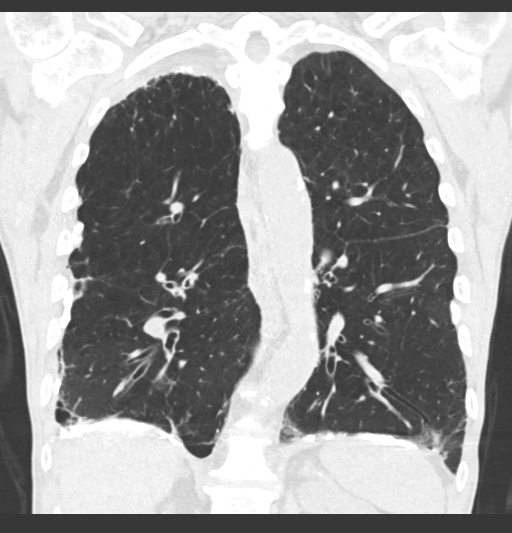

[15 of 36 positions shown; findings below may reference images not displayed]

FINDINGS: Cardiovascular: Aortic atherosclerosis. Coronary artery
calcifications. Normal heart size. No pericardial effusion.

Mediastinum/Nodes: No enlarged mediastinal, hilar, or axillary lymph
nodes. Frothy debris in the trachea. Thyroid and esophagus
demonstrate no significant findings.

Lungs/Pleura: Moderate centrilobular emphysema. Unchanged elongated,
irregular 12 x 8 x 8 mm pulmonary nodule of the right pulmonary apex
(series 10, image 36, series 12, image 50). There are numerous,
large, thick bilateral calcified pleural plaques. There is dependent
fibrotic change including bronchiolectasis with possible
honeycombing at the lung bases. No pleural effusion or pneumothorax.

Upper Abdomen: No acute abnormality.

Musculoskeletal: No chest wall mass or suspicious bone lesions
identified.
IMPRESSION: 1. Unchanged elongated, irregular 12 x 8 x 8 mm pulmonary nodule of
the right pulmonary apex (series 10, image 36, series 12, image 50).
Recommend additional follow-up at a total of 1 year from baseline to
establish ongoing stability.

2. There are numerous, large, thick bilateral calcified pleural
plaques. There is dependent fibrotic change including
bronchiolectasis with possible honeycombing at the lung bases.
Findings are consistent with asbestos exposure, asbestos pleural
disease, and fibrotic pulmonary asbestosis. Fibrotic findings are
somewhat increased over time on examinations dating back to
12/06/2015.

3.  Emphysema.

4.  Coronary artery disease and aortic atherosclerosis.

## 2020-11-25 IMAGING — DX RIGHT SHOULDER - 2+ VIEW
3 series · 3 of 3 positions shown · non-contrast
Comparison: None.

CLINICAL DATA: Chronic bilateral shoulder pain.

EXAM:
RIGHT SHOULDER - 2+ VIEW

[shoulder grashey]
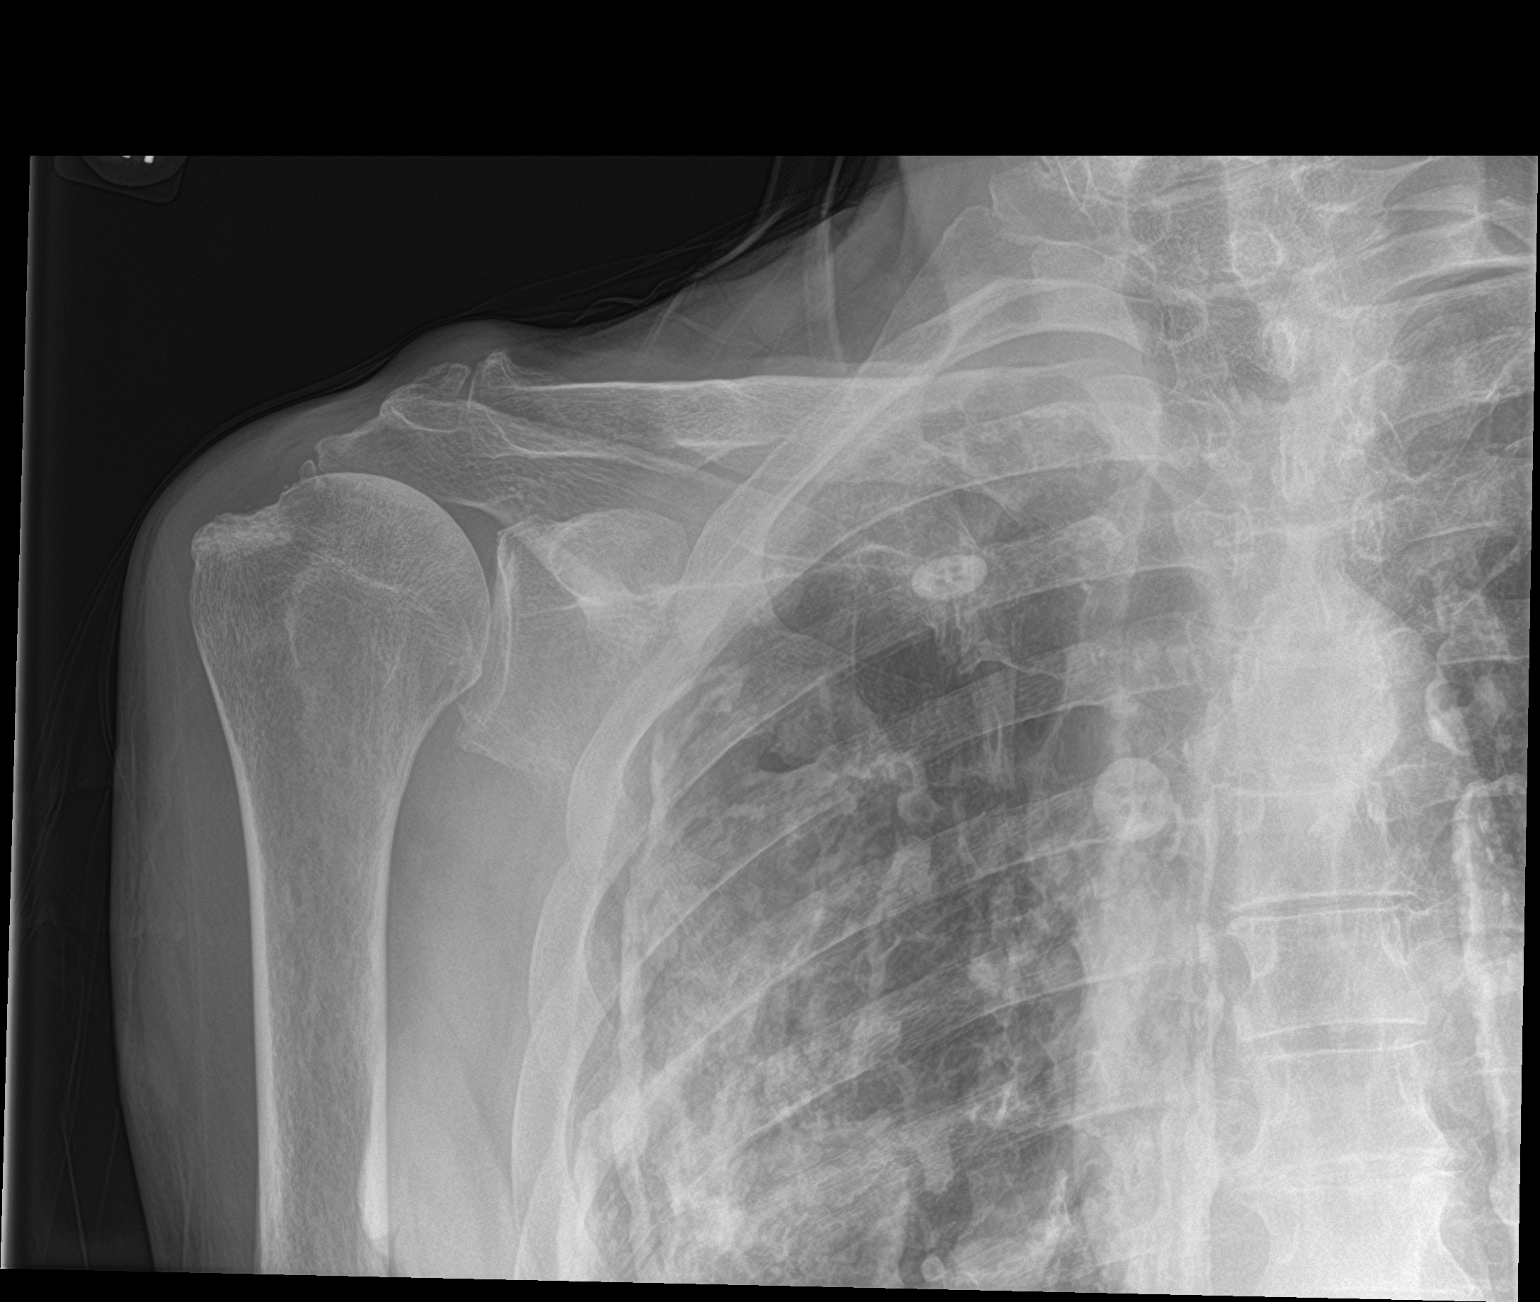

[shoulder y view]
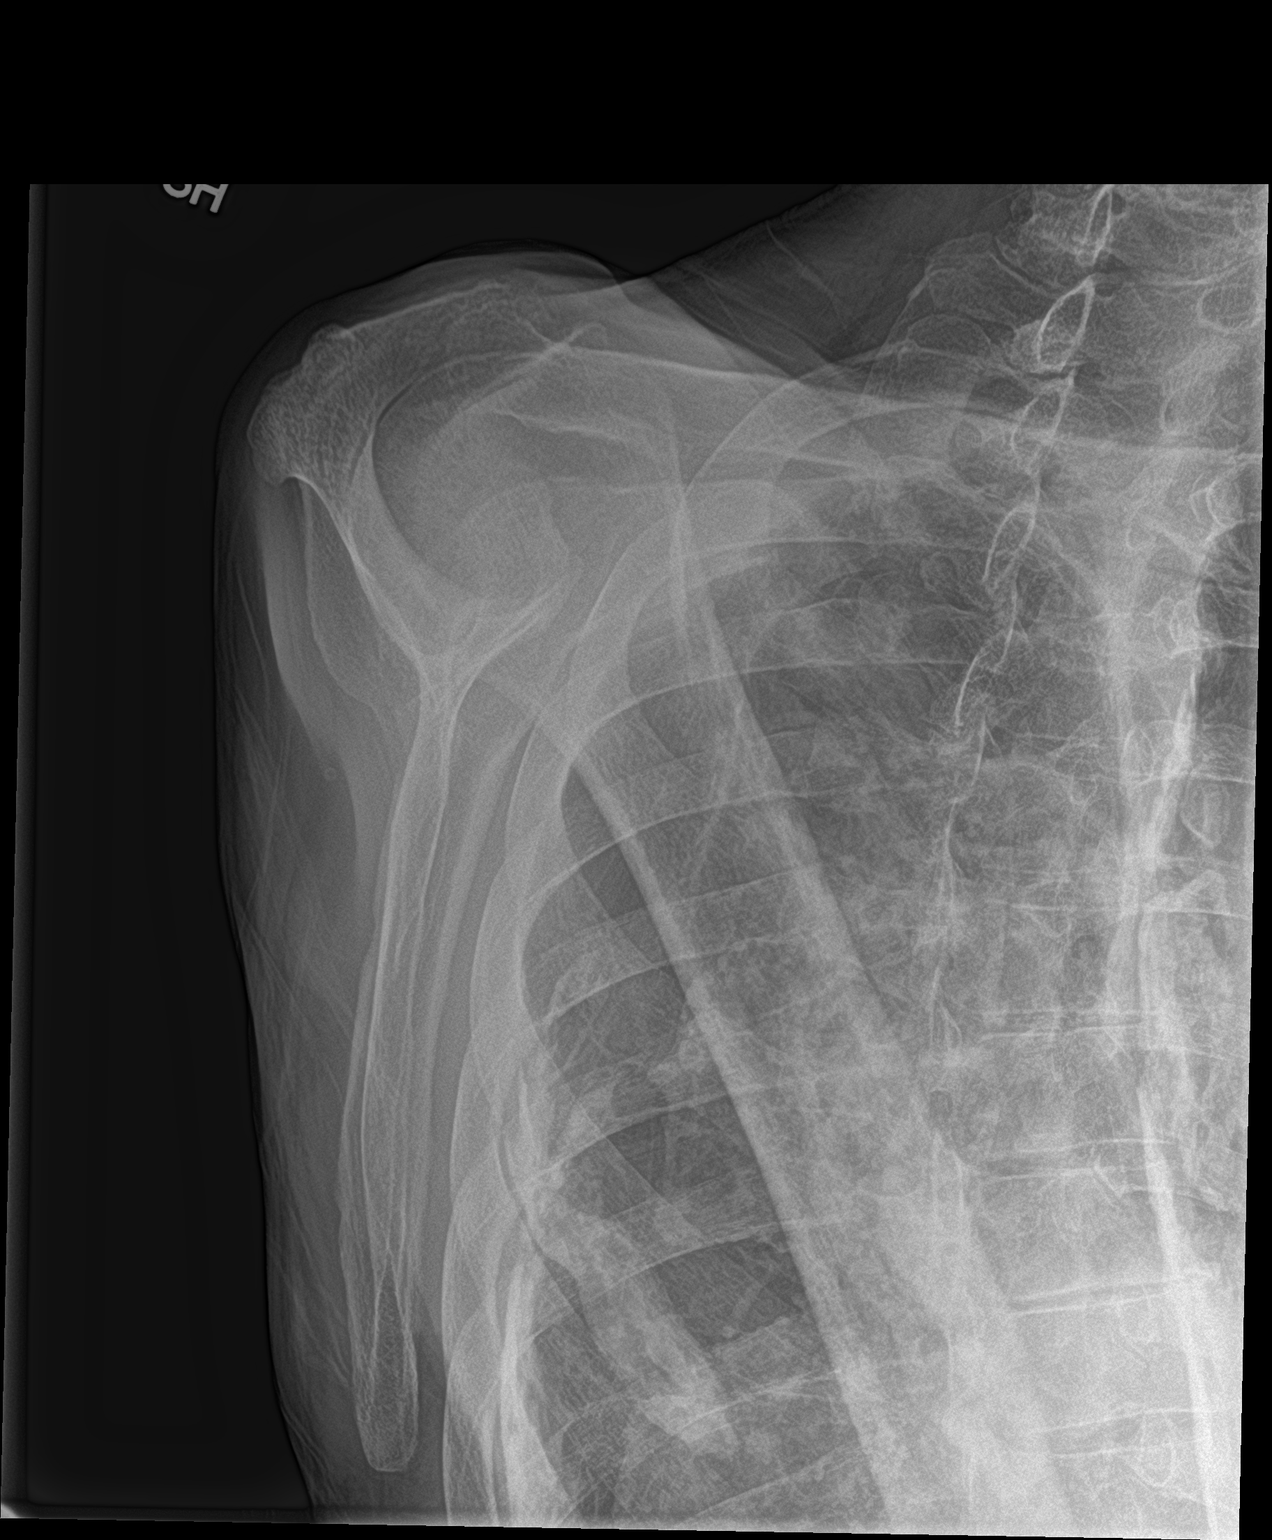

[shoulder axillary]
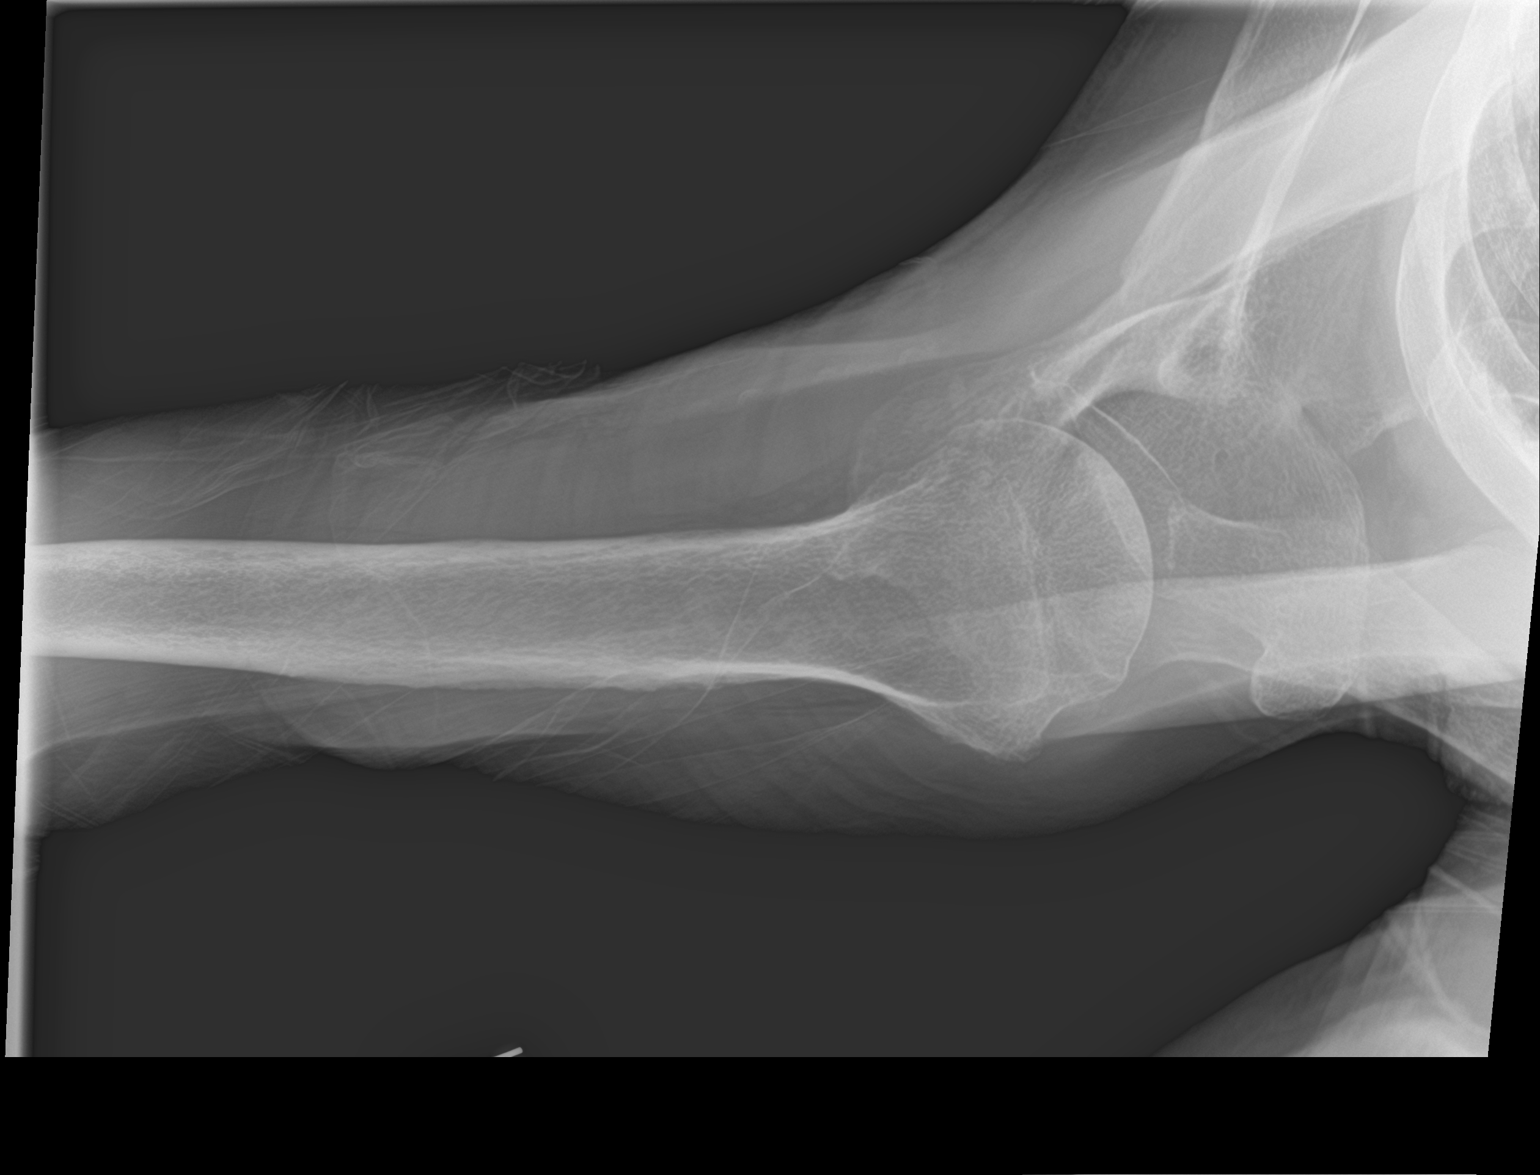

[3 of 3 positions shown; findings below may reference images not displayed]

FINDINGS: There is no evidence of fracture or dislocation. Moderate
degenerative changes seen involving the right acromioclavicular
joint. Glenohumeral joint is unremarkable. Soft tissues are
unremarkable.
IMPRESSION: Moderate degenerative joint disease of right acromioclavicular
joint. No acute abnormality seen in the right shoulder.

## 2020-11-30 ENCOUNTER — Encounter: Payer: Self-pay | Admitting: Physician Assistant

## 2020-12-01 ENCOUNTER — Encounter: Payer: Self-pay | Admitting: Physician Assistant

## 2020-12-01 NOTE — Progress Notes (Signed)
Wrote letter recommending no driving due to neurological impairment due to dementia secondary to Alzheimer's disease. Letter has been printed and Son is to pick it up Letter states :  "To Whom it may concern  :  Mr. Zackory Pudlo is under the care of Patrcia Dolly, MD and Marlowe Kays, PA-C for the management of Dementia. Patient had a Neuropsychological evaluation on 11/17/20 by Dr. Roseanne Reno, confirming a diagnosis of Moderate Alzheimer's Disease. Due to this diagnosis and the history and physical to confirm it, it is felt prudent and recommended that the patient does not drive as it is not safe for the patient and for others.  Please fell free to contact us with any other questions and concerns Sincerely,  Marlowe Kays, PA-C"

## 2020-12-07 ENCOUNTER — Encounter: Payer: Self-pay | Admitting: Emergency Medicine

## 2020-12-07 ENCOUNTER — Ambulatory Visit
Admission: EM | Admit: 2020-12-07 | Discharge: 2020-12-07 | Disposition: A | Discharge status: Home / Self Care | Payer: Medicare Other | Attending: Emergency Medicine | Admitting: Emergency Medicine

## 2020-12-07 ENCOUNTER — Other Ambulatory Visit: Payer: Self-pay

## 2020-12-07 DIAGNOSIS — H6123 Impacted cerumen, bilateral: Secondary | ICD-10-CM | POA: Diagnosis not present

## 2020-12-07 NOTE — ED Provider Notes (Signed)
[facility]    CSN: [id number] Arrival date & time: 12/07/20  1211      History   Chief Complaint Chief Complaint  Patient presents with   Ear Fullness    HPI Ashvik Grundman is a 87 y.o. male history of hypertension, hyperlipidemia, COPD, presenting today for evaluation of ear fullness.  Reports history of cerumen impaction and wax buildup needing irrigation a few times per year.  Recently has been complaining of decreased hearing.  HPI  Past Medical History:  Diagnosis Date   Asbestos exposure    diminished lung compacity from asbestos exposure 30 years   COPD (chronic obstructive pulmonary disease) (HCC)    Hyperlipidemia    Hypertension    Pneumonia    x 5    Patient Active Problem List   Diagnosis Date Noted   Community acquired pneumonia of right lung 08/11/2020   Asbestosis (HCC) 08/11/2020   Chronic respiratory failure with hypoxia (HCC) 08/11/2020   HTN (hypertension) 08/11/2020   Protein calorie malnutrition (HCC) 05/27/2020   Arthralgia of both hands 10/29/2019   Allergic rhinitis 08/31/2018   Cognitive disorder 01/07/2018   Exertional dyspnea 07/28/2016   COPD (chronic obstructive pulmonary disease) (HCC) 07/28/2016   Calcified pleural plaque due to asbestos exposure 07/28/2016    Past Surgical History:  Procedure Laterality Date   APPENDECTOMY     SHOULDER SURGERY Bilateral    x 5 ( 2 on right ; 3 on left)       Home Medications    Prior to Admission medications   Medication Sig Start Date End Date Taking? Authorizing Provider  albuterol (VENTOLIN HFA) 108 (90 Base) MCG/ACT inhaler inhale 2 puffs by mouth into the lungs every 6 hours as needed for wheezing and shortness of breath 08/24/20 08/24/21  Saguier, Ramon Dredge, PA-C  atorvastatin (LIPITOR) 40 MG tablet TAKE 1 TABLET BY MOUTH ONCE DAILY Patient not taking: Reported on 11/05/2020 09/03/19 09/02/20  Saguier, Ramon Dredge, PA-C  donepezil (ARICEPT) 10 MG tablet Take 1 tablet  (10mg ) daily 11/05/20    11/07/20, PA-C  Tiotropium Bromide-Olodaterol (STIOLTO RESPIMAT) 2.5-2.5 MCG/ACT AERS Inhale 1 puff into the lungs daily. 10/08/20   10/10/20, MD    Family History Family History  Problem Relation Age of Onset   Heart attack Father     Social History Social History   Tobacco Use   Smoking status: Former    Packs/day: 1.00    Years: 40.00    Pack years: 40.00    Types: Cigarettes    Quit date: 04/25/2000    Years since quitting: 20.6   Smokeless tobacco: Never  Vaping Use   Vaping Use: Never used  Substance Use Topics   Alcohol use: No    Comment: rarely   Drug use: No     Allergies   Penicillin g and Penicillins   Review of Systems Review of Systems  Constitutional:  Negative for activity change, appetite change, chills, fatigue and fever.  HENT:  Positive for hearing loss. Negative for congestion, ear pain, rhinorrhea, sinus pressure, sore throat and trouble swallowing.   Eyes:  Negative for discharge and redness.  Respiratory:  Negative for cough, chest tightness and shortness of breath.   Cardiovascular:  Negative for chest pain.  Gastrointestinal:  Negative for abdominal pain, diarrhea, nausea and vomiting.  Musculoskeletal:  Negative for myalgias.  Skin:  Negative for rash.  Neurological:  Negative for dizziness, light-headedness and headaches.    Physical Exam Triage  Vital Signs ED Triage Vitals  Enc Vitals Group     BP      Pulse      Resp      Temp      Temp src      SpO2      Weight      Height      Head Circumference      Peak Flow      Pain Score      Pain Loc      Pain Edu?      Excl. in GC?    No data found.  Updated Vital Signs BP (!) 141/80 (BP Location: Right Arm)   Pulse 77   Temp (!) 97.3 F (36.3 C) (Oral)   Resp 20   SpO2 91%   Visual Acuity Right Eye Distance:   Left Eye Distance:   Bilateral Distance:    Right Eye Near:   Left Eye Near:    Bilateral Near:     Physical Exam Vitals and nursing note  reviewed.  Constitutional:      Appearance: He is well-developed.     Comments: No acute distress  HENT:     Head: Normocephalic and atraumatic.     Ears:     Comments: Bilateral canals with cerumen impaction, canals appear to be slightly erythematous/irritated, after removal cerumen impaction improved, TMs intact with good bony landmarks and cone of light    Nose: Nose normal.  Eyes:     Conjunctiva/sclera: Conjunctivae normal.  Cardiovascular:     Rate and Rhythm: Normal rate.  Pulmonary:     Effort: Pulmonary effort is normal. No respiratory distress.  Abdominal:     General: There is no distension.  Musculoskeletal:        General: Normal range of motion.     Cervical back: Neck supple.  Skin:    General: Skin is warm and dry.  Neurological:     Mental Status: He is alert and oriented to person, place, and time.     UC Treatments / Results  Labs (all labs ordered are listed, but only abnormal results are displayed) Labs Reviewed - No data to display  EKG   Radiology No results found.  Procedures Procedures (including critical care time)  Medications Ordered in UC Medications - No data to display  Initial Impression / Assessment and Plan / UC Course  I have reviewed the triage vital signs and the nursing notes.  Pertinent labs & imaging results that were available during my care of the patient were reviewed by me and considered in my medical decision making (see chart for details).     Bilateral cerumen impaction removed by irrigation and instrumentation by myself and nursing staff, has some areas of erythema and irritation likely from Q-tip use, these were present prior to arrival.  No signs of secondary otitis media or externa.  Hearing improved.  Discussed strict return precautions. Patient verbalized understanding and is agreeable with plan.  Final Clinical Impressions(s) / UC Diagnoses   Final diagnoses:  Bilateral impacted cerumen   Discharge  Instructions   None    ED Prescriptions   None    PDMP not reviewed this encounter.   Lew Dawes, [place] 12/07/20 1355

## 2020-12-07 NOTE — ED Triage Notes (Addendum)
Patient c/o bilateral ear fullness x 1 day.   Patient endorses decreased hearing.   Patient denies pain.   Patient endorses this has occurred in the past.   Patient has low oxygen saturation during intake (85%), this writer made [facility] PA-C aware, patient has history of COPD). Placed on 2L [place].

## 2020-12-08 ENCOUNTER — Ambulatory Visit: Payer: Medicare Other | Admitting: Family Medicine

## 2020-12-22 ENCOUNTER — Other Ambulatory Visit (HOSPITAL_BASED_OUTPATIENT_CLINIC_OR_DEPARTMENT_OTHER): Payer: Self-pay

## 2020-12-22 MED ORDER — LEVOFLOXACIN 500 MG PO TABS
ORAL_TABLET | ORAL | 0 refills | Status: DC
  Filled 2020-12-22: qty 2, 2d supply, fill #0

## 2020-12-23 ENCOUNTER — Telehealth: Payer: Self-pay | Admitting: Medical

## 2020-12-23 NOTE — Telephone Encounter (Signed)
Placed in red folder  

## 2020-12-23 NOTE — Telephone Encounter (Signed)
Pt came in office and dropped off document of Department of Transportation ( 13 pages in a large grey and blue folder) Pt would like document when ready to call pt at [facility] [phone] to pick up. Document put at front office tray under providers name.

## 2020-12-24 ENCOUNTER — Encounter: Payer: Medicare Other | Admitting: Psychology

## 2020-12-25 ENCOUNTER — Telehealth: Payer: Self-pay

## 2020-12-25 NOTE — Telephone Encounter (Signed)
Fyi.

## 2020-12-25 NOTE — Telephone Encounter (Signed)
Judeth Cornfield, PT with [facility] called stating patient and his son (POA) advised her they are declining PT at this time.  CB # for Judeth Cornfield is [phone].

## 2020-12-31 ENCOUNTER — Encounter: Payer: Self-pay | Admitting: Medical

## 2021-01-04 ENCOUNTER — Encounter: Payer: Medicare Other | Admitting: Psychology

## 2021-01-04 ENCOUNTER — Encounter: Payer: Self-pay | Admitting: Physician Assistant

## 2021-01-04 NOTE — Telephone Encounter (Signed)
Jonathan Harmon pt's son (who dropped paperwork on 12-23-2020) came in office to pick up documents that he dropped off for provider to fill out decided to take forms without having them filled out due to the conversation he had with Ramon Dredge on [place], pt's son stated will get documents filled out with specialist and appreciated what Ramon Dredge has done.

## 2021-01-05 DIAGNOSIS — Z0279 Encounter for issue of other medical certificate: Secondary | ICD-10-CM

## 2021-01-25 ENCOUNTER — Ambulatory Visit: Payer: Medicare Other | Admitting: Physician Assistant

## 2021-02-03 ENCOUNTER — Telehealth: Payer: Self-pay | Admitting: Medical

## 2021-02-03 NOTE — Telephone Encounter (Signed)
Form placed in edward's basket 

## 2021-02-03 NOTE — Telephone Encounter (Addendum)
Sarah,  Regarding pt dmv form/neurology section are you filling that section out. Do you feel it is safe for him to  drive from cognitive/dementia stand point?   Son presented forms/other sections for me in past. But I declined  to fill since had concerns. It has been some times since I have seen him.   Thanks for your help. Esperanza Richters, PA-C

## 2021-02-03 NOTE — Telephone Encounter (Signed)
Opened to review 

## 2021-02-03 NOTE — Telephone Encounter (Signed)
Patient son dropped off forms to be filled out Call [phone] when ready to pick up  Papers placed into saguier bin up front

## 2021-02-12 ENCOUNTER — Telehealth: Payer: Self-pay | Admitting: Medical

## 2021-02-12 ENCOUNTER — Encounter: Payer: Self-pay | Admitting: Medical

## 2021-02-12 NOTE — Telephone Encounter (Signed)
I filled out pt dmv form but need to whole packet.   Pg 3-7.   Need to review pages 4 and 7 in particular.   May need pulmonologist to fill out respiratory section.  Can you find that on line   Thanks,

## 2021-02-12 NOTE — Telephone Encounter (Signed)
Opened to review and fill out dmv form.

## 2021-02-18 ENCOUNTER — Telehealth: Payer: Self-pay | Admitting: Medical

## 2021-02-18 NOTE — Telephone Encounter (Signed)
I filled out pt dmv form. Please fax over. If you would file the forms for future reference.  If you could let pt son know we faxed over the form.

## 2021-02-18 NOTE — Telephone Encounter (Signed)
Son called and notified stated he will pick up forms , copy made for our records , no fax # on paperwork son will handle it

## 2021-03-08 ENCOUNTER — Other Ambulatory Visit (HOSPITAL_BASED_OUTPATIENT_CLINIC_OR_DEPARTMENT_OTHER): Payer: Self-pay

## 2021-05-10 ENCOUNTER — Other Ambulatory Visit (HOSPITAL_BASED_OUTPATIENT_CLINIC_OR_DEPARTMENT_OTHER): Payer: Self-pay

## 2021-05-10 ENCOUNTER — Encounter: Payer: Self-pay | Admitting: Physician Assistant

## 2021-05-10 ENCOUNTER — Other Ambulatory Visit: Payer: Self-pay

## 2021-05-10 ENCOUNTER — Ambulatory Visit (INDEPENDENT_AMBULATORY_CARE_PROVIDER_SITE_OTHER): Payer: Medicare Other | Admitting: Physician Assistant

## 2021-05-10 VITALS — BP 139/73 | HR 88 | Resp 18 | Ht 68.0 in | Wt 113.0 lb

## 2021-05-10 DIAGNOSIS — G301 Alzheimer's disease with late onset: Secondary | ICD-10-CM | POA: Diagnosis not present

## 2021-05-10 DIAGNOSIS — F02818 Dementia in other diseases classified elsewhere, unspecified severity, with other behavioral disturbance: Secondary | ICD-10-CM

## 2021-05-10 MED ORDER — DONEPEZIL HCL 10 MG PO TABS
10.0000 mg | ORAL_TABLET | Freq: Every day | ORAL | 11 refills | Status: DC
  Filled 2021-05-10: qty 30, 30d supply, fill #0

## 2021-05-10 NOTE — Patient Instructions (Addendum)
It was a pleasure to see you today at our office.   Recommendations:  Meds: Follow up in  6 months Restart donepezil 10 mg daily.     RECOMMENDATIONS FOR ALL PATIENTS WITH MEMORY PROBLEMS: 1. Continue to exercise (Recommend 30 minutes of walking everyday, or 3 hours every week) 2. Increase social interactions - continue going to [place] and enjoy social gatherings with friends and family 3. Eat healthy, avoid fried foods and eat more fruits and vegetables 4. Maintain adequate blood pressure, blood sugar, and blood cholesterol level. Reducing the risk of stroke and cardiovascular disease also helps promoting better memory. 5. Avoid stressful situations. Live a simple life and avoid aggravations. Organize your time and prepare for the next day in anticipation. 6. Sleep well, avoid any interruptions of sleep and avoid any distractions in the bedroom that may interfere with adequate sleep quality 7. Avoid sugar, avoid sweets as there is a strong link between excessive sugar intake, diabetes, and cognitive impairment We discussed the Mediterranean diet, which has been shown to help patients reduce the risk of progressive memory disorders and reduces cardiovascular risk. This includes eating fish, eat fruits and green leafy vegetables, nuts like almonds and hazelnuts, walnuts, and also use olive oil. Avoid fast foods and fried foods as much as possible. Avoid sweets and sugar as sugar use has been linked to worsening of memory function.  There is always a concern of gradual progression of memory problems. If this is the case, then we may need to adjust level of care according to patient needs. Support, both to the patient and caregiver, should then be put into place.    The Alzheimers Association is here all day, every day for people facing Alzheimers disease through our free 24/7 Helpline: 808-177-9899. The Helpline provides reliable information and support to all those who need assistance, such  as individuals living with memory loss, Alzheimer's or other dementia, caregivers, health care professionals and the public.  Our highly trained and knowledgeable staff can help you with: Understanding memory loss, dementia and Alzheimer's  Medications and other treatment options  General information about aging and brain health  Skills to provide quality care and to find the best care from professionals  Legal, financial and living-arrangement decisions Our Helpline also features: Confidential care consultation provided by master's level clinicians who can help with decision-making support, crisis assistance and education on issues families face every day  Help in a caller's preferred language using our translation service that features more than 200 languages and dialects  Referrals to local community programs, services and ongoing support     FALL PRECAUTIONS: Be cautious when walking. Scan the area for obstacles that may increase the risk of trips and falls. When getting up in the mornings, sit up at the edge of the bed for a few minutes before getting out of bed. Consider elevating the bed at the head end to avoid drop of blood pressure when getting up. Walk always in a well-lit room (use night lights in the walls). Avoid area rugs or power cords from appliances in the middle of the walkways. Use a walker or a cane if necessary and consider physical therapy for balance exercise. Get your eyesight checked regularly.  FINANCIAL OVERSIGHT: Supervision, especially oversight when making financial decisions or transactions is also recommended.  HOME SAFETY: Consider the safety of the kitchen when operating appliances like stoves, microwave oven, and blender. Consider having supervision and share cooking responsibilities until no longer able to participate  in those. Accidents with firearms and other hazards in the house should be identified and addressed as well.   ABILITY TO BE LEFT ALONE: If  patient is unable to contact 911 operator, consider using LifeLine, or when the need is there, arrange for someone to stay with patients. Smoking is a fire hazard, consider supervision or cessation. Risk of wandering should be assessed by caregiver and if detected at any point, supervision and safe proof recommendations should be instituted.  MEDICATION SUPERVISION: Inability to self-administer medication needs to be constantly addressed. Implement a mechanism to ensure safe administration of the medications.       Mediterranean Diet A Mediterranean diet refers to food and lifestyle choices that are based on the traditions of countries located on the [employer]. This way of eating has been shown to help prevent certain conditions and improve outcomes for people who have chronic diseases, like kidney disease and heart disease. What are tips for following this plan? Lifestyle  Cook and eat meals together with your family, when possible. Drink enough fluid to keep your urine clear or pale yellow. Be physically active every day. This includes: Aerobic exercise like running or swimming. Leisure activities like gardening, walking, or housework. Get 7-8 hours of sleep each night. If recommended by your health care provider, drink red wine in moderation. This means 1 glass a day for nonpregnant women and 2 glasses a day for men. A glass of wine equals 5 oz (150 mL). Reading food labels  Check the serving size of packaged foods. For foods such as rice and pasta, the serving size refers to the amount of cooked product, not dry. Check the total fat in packaged foods. Avoid foods that have saturated fat or trans fats. Check the ingredients list for added sugars, such as corn syrup. Shopping  At the grocery store, buy most of your food from the areas near the walls of the store. This includes: Fresh fruits and vegetables (produce). Grains, beans, nuts, and seeds. Some of these may be available in  unpackaged forms or large amounts (in bulk). Fresh seafood. Poultry and eggs. Low-fat dairy products. Buy whole ingredients instead of prepackaged foods. Buy fresh fruits and vegetables in-season from local farmers markets. Buy frozen fruits and vegetables in resealable bags. If you do not have access to quality fresh seafood, buy precooked frozen shrimp or canned fish, such as tuna, salmon, or sardines. Buy small amounts of raw or cooked vegetables, salads, or olives from the deli or salad bar at your store. Stock your pantry so you always have certain foods on hand, such as olive oil, canned tuna, canned tomatoes, rice, pasta, and beans. Cooking  Cook foods with extra-virgin olive oil instead of using butter or other vegetable oils. Have meat as a side dish, and have vegetables or grains as your main dish. This means having meat in small portions or adding small amounts of meat to foods like pasta or stew. Use beans or vegetables instead of meat in common dishes like chili or lasagna. Experiment with different cooking methods. Try roasting or broiling vegetables instead of steaming or sauteing them. Add frozen vegetables to soups, stews, pasta, or rice. Add nuts or seeds for added healthy fat at each meal. You can add these to yogurt, salads, or vegetable dishes. Marinate fish or vegetables using olive oil, lemon juice, garlic, and fresh herbs. Meal planning  Plan to eat 1 vegetarian meal one day each week. Try to work up to 2 vegetarian  meals, if possible. Eat seafood 2 or more times a week. Have healthy snacks readily available, such as: Vegetable sticks with hummus. Greek yogurt. Fruit and nut trail mix. Eat balanced meals throughout the week. This includes: Fruit: 2-3 servings a day Vegetables: 4-5 servings a day Low-fat dairy: 2 servings a day Fish, poultry, or lean meat: 1 serving a day Beans and legumes: 2 or more servings a week Nuts and seeds: 1-2 servings a day Whole  grains: 6-8 servings a day Extra-virgin olive oil: 3-4 servings a day Limit red meat and sweets to only a few servings a month What are my food choices? Mediterranean diet Recommended Grains: Whole-grain pasta. Brown rice. Bulgar wheat. Polenta. Couscous. Whole-wheat bread. Orpah Cobb. Vegetables: Artichokes. Beets. Broccoli. Cabbage. Carrots. Eggplant. Green beans. Chard. Kale. Spinach. Onions. Leeks. Peas. Squash. Tomatoes. Peppers. Radishes. Fruits: Apples. Apricots. Avocado. Berries. Bananas. Cherries. Dates. Figs. Grapes. Lemons. Melon. Oranges. Peaches. Plums. Pomegranate. Meats and other protein foods: Beans. Almonds. Sunflower seeds. Pine nuts. Peanuts. Cod. Salmon. Scallops. Shrimp. Tuna. Tilapia. Clams. Oysters. Eggs. Dairy: Low-fat milk. Cheese. Greek yogurt. Beverages: Water. Red wine. Herbal tea. Fats and oils: Extra virgin olive oil. Avocado oil. Grape seed oil. Sweets and desserts: [place] yogurt with honey. Baked apples. Poached pears. Trail mix. Seasoning and other foods: Basil. Cilantro. Coriander. Cumin. Mint. Parsley. Sage. Rosemary. Tarragon. Garlic. Oregano. Thyme. Pepper. Balsalmic vinegar. Tahini. Hummus. Tomato sauce. Olives. Mushrooms. Limit these Grains: Prepackaged pasta or rice dishes. Prepackaged cereal with added sugar. Vegetables: Deep fried potatoes (french fries). Fruits: Fruit canned in syrup. Meats and other protein foods: Beef. Pork. Lamb. Poultry with skin. Hot dogs. Tomasa Blase. Dairy: Ice cream. Sour cream. Whole milk. Beverages: Juice. Sugar-sweetened soft drinks. Beer. Liquor and spirits. Fats and oils: Butter. Canola oil. Vegetable oil. Beef fat (tallow). Lard. Sweets and desserts: Cookies. Cakes. Pies. Candy. Seasoning and other foods: Mayonnaise. Premade sauces and marinades. The items listed may not be a complete list. Talk with your dietitian about what dietary choices are right for you. Summary The Mediterranean diet includes both food and  lifestyle choices. Eat a variety of fresh fruits and vegetables, beans, nuts, seeds, and whole grains. Limit the amount of red meat and sweets that you eat. Talk with your health care provider about whether it is safe for you to drink red wine in moderation. This means 1 glass a day for nonpregnant women and 2 glasses a day for men. A glass of wine equals 5 oz (150 mL). This information is not intended to replace advice given to you by your health care provider. Make sure you discuss any questions you have with your health care provider. Document Released: 12/03/2015 Document Revised: 01/05/2016 Document Reviewed: 12/03/2015 Elsevier Interactive Patient Education  2017 [employer].

## 2021-05-10 NOTE — Progress Notes (Signed)
Assessment/Plan:     Late Onset  Alzheimer's Dementia with behavioral disturbance MMSE today is 18/30, with delayed recall 0/3, orientation 1 of 5. He has not been on Aricept for about 5 months due to lack of compliance, his son reports cognitive decline in both short-term and long-term memory.  We discussed restarting the medication now that the patient has a sitter 3 times a week, and he is able to manage his medications the following days, with the goal of slowing down his cognitive decline.  [facility] to be able to be safe and be able to socialize as well.   Recommendations  Discussed the diagnosis of dementia, likely due to Alzheimer's disease. Discussed safety both in and out of the home.  Discussed independent living facilities for safety and socialization. Discussed the importance of regular daily schedule to maintain brain function.  Continue to monitor mood. Say active exercising  at least 30 minutes at least 3 times a week.  Naps should be scheduled and should be no longer than 60 minutes and should not occur after 2 PM.  Restart donepezil 10 mg daily Follow up in  6 months.   Case discussed with Dr. Karel Jarvis who agrees with the plan    Subjective:    ED visits: none Hospital admissions: none   Holly Pring  is a 88 y.o.male seen today in follow up for memory loss.  This patient is accompanied in the office by his son who supplements the history.  Previous records as well as any outside records available were reviewed prior to todays visit.  Patient was on donepezil until September 2022, but due to lack of adherence to the medication, forgetting several doses, he is on discontinue the medication.  He recently hired a Comptroller 3 times a week, and is hoping to restart the medication after this visit.  Although the patient says that his memory is "okay ", he is on reports that he is beginning to show a decline in his short-term  and long-term memory.  Sometimes he does not recognize his own son asking him "you are my son right?  ""Winded I get married, am I married? ". States he does not know why he is living in the son's house, and he forgets immediate information.  He also has been "playing with the thermostat, he likes it very hot inside the house, and there is some friction with my wife regarding that "-son says.  He has significant decrease in socialization, only watching the stock market all day.  He used to play solitaire in the computer, and he has stopped doing that.  Son denies that the patient is depressed, simply he has lost interest in doing these activities.  He interacts somewhat with the sitter on Tuesday Wednesdays and Thursdays.  There are no hygiene concerns.  He still independent of dressing and bathing.  He denies any hallucinations or paranoia, son agrees.  He his mood is overall good, but today he is quite "feisty ", as the papers for discontinuing his ability to drive had been given to him.  "I took my keys out ".  He continues to walk but due to cold weather, he has decreased some of these activities.  He likes to play golf though.  His appetite is good, has gained a few pounds, and he drinks plenty water.  His son is in charge of the finances.  He ambulates without difficulty, without the use  of a walker or a cane.  He denies any headaches, trauma, double vision, dizziness, focal numbness or tingling, unilateral weakness or tremors.  Denies urine incontinence or retention, constipation or diarrhea.     INITIAL EVALUATION 09/14/20 The patient is seen in neurologic consultation at the request of Saguier, Kateri Mc for the evaluation of memory.  The patient is accompanied by his Son [name], who supplements the history.  The patient is a 88 y.o. year old right-handed male who has had memory issues for about  3-4 years. He was seen at [facility] in 02/2018 at which time he had a Neurpsych  evaluation, confirming the presence of Mild Neurocognitive Disorder likely due to Alzheimer's Disease. "Patient states that he did go to the neuro psych appointment but he apparently "purposely antagonize the tester so he does not feel he got a fair shake as far as his evaluation" and he did not want further appts. his primary provider at the time did not feel starting him on medication such as Aricept, as he was moving out of [place], [place].  When he moved to [place] to live with his son, he reported a cognitive decline to his PCP, MMSE was 20/30. He was referred to [name] for further work-up.  Patient feels that memory is "good at times, but he may be getting worse", Son feels that he has definite trouble with retrieving words, especially with short-term memory, and to some degree with long-term memory, unable to remember names of family members that he knew throughout his life. Mood is good, without depression or irritability. Denies hallucinations or paranoia. Sleeps 8 to 9 hours a night, occasionally takes a nap while watching TV.  He likes to read, playing hearts on the computer, watching a lot of TV and walking with his son daily. Denies vivid dreams or sleepwalking. Patient dresses up and bathes without help.  Denies missing medications and uses a pillbox.   Denies living objects in unusual places. Patient's does his own finances with his sons supervision.  Ambulating without difficulty without walker or cane. Patient continues to drive and denies getting lost.  Denies headaches, trauma, or injuries to the head, double vision,  dizziness, focal numbness or tingling, unilateral weakness or tremors. Denies urine incontinence or retention. Denies constipation or diarrhea. Denies  ETOH or tobacco use.  Family History remarkable for mother having been diagnosed with Alzheimer's disease, died at age 88. He is widower and has 2 children.     Neuropsychological assessment 10/14/20 Dr. Roseanne Reno "  demonstrates memory storage problems and diminished semantic as compared to phonemic fluency, which are findings often observed in the setting of Alzheimer's clinical syndrome. He does not have naming impairment. Performance was reasonable in all other domains, although he did have scattered low scores on measures relevant to executive functioning including timed number-symbol coding and reasoning with verbal information. He screened negative for the presence of depression and he falls in the moderate dementia range on the CDR.   Mr. Mccamy is thus demonstrating a clinical dementia syndrome with a high index of suspicion for Alzheimer's disease given demographics, supportive structural imaging and neuropsychological test data. I think that his recent worsening is the result of his pneumonia and medical illness and he may realize some improvement or at least stabilization now that he is convalescing. He is most certainly not capable of making medical and financial decisions of high risk, in my opinion, given very significant anosognosia and substantial cognitive  impairments that seem to compromise his capacity for sound judgment. He is already on Aricept"   PREVIOUS MEDICATIONS:   CURRENT MEDICATIONS:  Outpatient Encounter Medications as of 05/10/2021  Medication Sig   albuterol (VENTOLIN HFA) 108 (90 Base) MCG/ACT inhaler inhale 2 puffs by mouth into the lungs every 6 hours as needed for wheezing and shortness of breath   atorvastatin (LIPITOR) 40 MG tablet TAKE 1 TABLET BY MOUTH ONCE DAILY   Tiotropium Bromide-Olodaterol (STIOLTO RESPIMAT) 2.5-2.5 MCG/ACT AERS Inhale 1 puff into the lungs daily.   donepezil (ARICEPT) 10 MG tablet Take 1 tablet  (10mg ) daily (Patient not taking: Reported on 05/10/2021)   levofloxacin (LEVAQUIN) 500 MG tablet Take 1 tablet (500 mg total) by mouth daily for 2 days. (Patient not taking: Reported on 05/10/2021)   No facility-administered encounter medications on file as of  05/10/2021.     Objective:     PHYSICAL EXAMINATION:    VITALS:   Vitals:   05/10/21 1423  BP: 139/73  Pulse: 88  Resp: 18  SpO2: 95%  Weight: 113 lb (51.3 kg)  Height: 5\' 7"  (1.702 m)     GEN:  The patient appears stated age and is in NAD.  HEENT:  Normocephalic, atraumatic.   Neurological examination:  General: NAD, well-groomed, appears stated age. Orientation: alert and oriented to person, place and time.   Cranial nerves: There is good facial symmetry.The speech is fluent and clear. No aphasia or dysarthria. Fund of knowledge is reduced. Recent and remote memory are impaired. Attention and concentration are normal. Able to name objects and repeat phrases.  Hearing is intact to conversational tone.    Sensation: Sensation is intact to light touch throughout Motor: Strength is at least antigravity x4.  Montreal Cognitive Assessment  09/14/2020  Visuospatial/ Executive (0/5) 2  Naming (0/3) 2  Attention: Read list of digits (0/2) 1  Attention: Read list of letters (0/1) 1  Attention: Serial 7 subtraction starting at 100 (0/3) 3  Language: Repeat phrase (0/2) 0  Language : Fluency (0/1) 1  Abstraction (0/2) 2  Delayed Recall (0/5) 0  Orientation (0/6) 2  Total 14     MMSE - Mini Mental State Exam 10/14/2020  Orientation to time 1  Orientation to Place 1  Registration 3  Attention/ Calculation 5  Recall 0  Language- name 2 objects 2  Language- repeat 0  Language- follow 3 step command 3  Language- read & follow direction 1  Write a sentence 1  Copy design 1  Total score 18      Movement examination: Tone: There is normal tone in the UE/LE Abnormal movements:  no tremor.  No myoclonus.  No asterixis.   Coordination:  There is no decremation with RAM's. Normal finger to nose  Gait and Station: The patient has no difficulty arising out of a deep-seated chair without the use of the hands. The patient's stride length is good.  Gait is cautious and narrow.         Total time spent on today's visit was 40 minutes, including both face-to-face time and nonface-to-face time.  Time included that spent on review of records (prior notes available to me/labs/imaging if pertinent), discussing treatment and goals, answering patient's questions and coordinating care.  Cc:  Saguier, Ramon DredgeEdward, PA-C Marlowe KaysSara Mikahla Wisor, PA-C

## 2021-05-14 ENCOUNTER — Other Ambulatory Visit (HOSPITAL_BASED_OUTPATIENT_CLINIC_OR_DEPARTMENT_OTHER): Payer: Self-pay

## 2021-05-31 ENCOUNTER — Ambulatory Visit (INDEPENDENT_AMBULATORY_CARE_PROVIDER_SITE_OTHER): Payer: Medicare Other | Admitting: Medical

## 2021-05-31 ENCOUNTER — Encounter: Payer: Self-pay | Admitting: Medical

## 2021-05-31 VITALS — BP 130/67 | HR 88 | Temp 98.2°F | Resp 18 | Ht 68.0 in | Wt 111.0 lb

## 2021-05-31 DIAGNOSIS — F09 Unspecified mental disorder due to known physiological condition: Secondary | ICD-10-CM | POA: Diagnosis not present

## 2021-05-31 DIAGNOSIS — D649 Anemia, unspecified: Secondary | ICD-10-CM

## 2021-05-31 DIAGNOSIS — E785 Hyperlipidemia, unspecified: Secondary | ICD-10-CM | POA: Diagnosis not present

## 2021-05-31 DIAGNOSIS — I10 Essential (primary) hypertension: Secondary | ICD-10-CM | POA: Diagnosis not present

## 2021-05-31 DIAGNOSIS — R413 Other amnesia: Secondary | ICD-10-CM

## 2021-05-31 NOTE — Progress Notes (Signed)
Subjective:    Patient ID: Jonathan Harmon, male    DOB: 17-Jul-1933, 88 y.o.   MRN: [id number]  HPI   Pt in for exam and to fill out wellness exam. [facility]. Son has plans to move him to [place].   Pt has dementia. Last neurologist note reviewed.  "Jonathan Harmon  is a 88 y.o.male seen today in follow up for memory loss.  This patient is accompanied in the office by his son who supplements the history.  Previous records as well as any outside records available were reviewed prior to todays visit.  Patient was on donepezil until September 2022, but due to lack of adherence to the medication, forgetting several doses, he is on discontinue the medication.  He recently hired a Actuary 3 times a week, and is hoping to restart the medication after this visit.  Although the patient says that his memory is "okay ", he is on reports that he is beginning to show a decline in his short-term and long-term memory.  Sometimes he does not recognize his own son asking him "you are my son right?  ""Winded I get married, am I married? ". States he does not know why he is living in the son's house, and he forgets immediate information.  He also has been "playing with the thermostat, he likes it very hot inside the house, and there is some friction with my wife regarding that "-son says.  He has significant decrease in socialization, only watching the stock market all day.  He used to play solitaire in the computer, and he has stopped doing that.  Son denies that the patient is depressed, simply he has lost interest in doing these activities.  He interacts somewhat with the sitter on Tuesday Wednesdays and Thursdays.  There are no hygiene concerns.  He still independent of dressing and bathing.  He denies any hallucinations or paranoia, son agrees.  He his mood is overall good, but today he is quite "feisty ", as the papers for discontinuing his ability to drive had been given to him.   "I took my keys out ".  He continues to walk but due to cold weather, he has decreased some of these activities.  He likes to play golf though.  His appetite is good, has gained a few pounds, and he drinks plenty water.  His son is in charge of the finances.  He ambulates without difficulty, without the use of a walker or a cane.  He denies any headaches, trauma, double vision, dizziness, focal numbness or tingling, unilateral weakness or tremors.  Denies urine incontinence or retention, constipation or diarrhea."   Copd history- he is doing well. Using 2 inhalers stiolto and albuterol.   High cholesterol- on atorvastatin.  Mild anemia in past. Will get cbc and iron level.    Review of Systems  Constitutional:  Negative for chills and fatigue.  Respiratory:  Negative for cough, chest tightness, shortness of breath and wheezing.   Cardiovascular:  Negative for chest pain and palpitations.  Gastrointestinal:  Negative for abdominal pain, diarrhea, nausea and vomiting.  Genitourinary:  Negative for dysuria.  Musculoskeletal:  Negative for back pain and joint swelling.  Skin:  Negative for rash.  Neurological:  Negative for dizziness, seizures, syncope, weakness, numbness and headaches.  Psychiatric/Behavioral:  Positive for behavioral problems. Negative for decreased concentration, dysphoric mood, sleep disturbance and suicidal ideas.        Dementia.  Past Medical History:  Diagnosis Date   Asbestos exposure    diminished lung compacity from asbestos exposure 30 years   COPD (chronic obstructive pulmonary disease) (JAARS)    Hyperlipidemia    Hypertension    Pneumonia    x 5     Social History   Socioeconomic History   Marital status: Widowed    Spouse name: Not on file   Number of children: Not on file   Years of education: Not on file   Highest education level: Not on file  Occupational History   Occupation: Retired Airline pilot  Tobacco Use   Smoking status: Former     Packs/day: 1.00    Years: 40.00    Pack years: 40.00    Types: Cigarettes    Quit date: 04/25/2000    Years since quitting: 21.1   Smokeless tobacco: Never  Vaping Use   Vaping Use: Never used  Substance and Sexual Activity   Alcohol use: No    Comment: rarely   Drug use: No   Sexual activity: Not Currently  Other Topics Concern   Not on file  Social History Narrative   Right handed    Lives with his son   Social Determinants of Health   Financial Resource Strain: Not on file  Food Insecurity: Not on file  Transportation Needs: Not on file  Physical Activity: Not on file  Stress: Not on file  Social Connections: Not on file  Intimate Partner Violence: Not on file    Past Surgical History:  Procedure Laterality Date   APPENDECTOMY     SHOULDER SURGERY Bilateral    x 5 ( 2 on right ; 3 on left)    Family History  Problem Relation Age of Onset   Heart attack Father     Allergies  Allergen Reactions   Penicillin G Swelling   Penicillins Rash    Reported to family member - occurred when he was in his 52s     Current Outpatient Medications on File Prior to Visit  Medication Sig Dispense Refill   albuterol (VENTOLIN HFA) 108 (90 Base) MCG/ACT inhaler inhale 2 puffs by mouth into the lungs every 6 hours as needed for wheezing and shortness of breath 8.5 g 11   donepezil (ARICEPT) 10 MG tablet Take 1 tablet (10 mg total) by mouth daily. 30 tablet 11   levofloxacin (LEVAQUIN) 500 MG tablet Take 1 tablet (500 mg total) by mouth daily for 2 days. 2 tablet 0   Tiotropium Bromide-Olodaterol (STIOLTO RESPIMAT) 2.5-2.5 MCG/ACT AERS Inhale 1 puff into the lungs daily. 4 g 11   atorvastatin (LIPITOR) 40 MG tablet TAKE 1 TABLET BY MOUTH ONCE DAILY 30 tablet 3   No current facility-administered medications on file prior to visit.    BP 130/67    Pulse 88    Temp 98.2 F (36.8 C)    Resp 18    Ht 5\' 8"  (1.727 m)    Wt 111 lb (50.3 kg)    SpO2 93%    BMI 16.88 kg/m         Objective:   Physical Exam  General Mental Status- Alert. General Appearance- Not in acute distress.   Skin General: Color- Normal Color. Moisture- Normal Moisture.  Neck Carotid Arteries- Normal color. Moisture- Normal Moisture. No carotid bruits. No JVD.  Chest and Lung Exam Auscultation: Breath Sounds:-Normal.  Cardiovascular Auscultation:Rythm- Regular. Murmurs & Other Heart Sounds:Auscultation of the heart reveals- No Murmurs.  Abdomen Inspection:-Inspeection  Normal. Palpation/Percussion:Note:No mass. Palpation and Percussion of the abdomen reveal- Non Tender, Non Distended + BS, no rebound or guarding.  Neurologic Cranial Nerve exam:- CN III-XII intact(No nystagmus), symmetric smile. Strength:- 5/5 equal and symmetric strength both upper and lower extremities.   Lower ext- no pedal edema.    Assessment & Plan:   Patient Instructions  Dementia- continue aricept. [facility]. Pt is coorperative. No hallucination and no paranoia. He will be moving to [place] close to his daughter.  Copd stable. Continue current inhalers.   For high cholesterol continue atorvastatin. Get lipid panel today.  For mild anemia get cbc and iron level.  Htn- bp well controlled now. No longer on bp meds.  Follow up as needed.    Mackie Pai, PA-C    Time spent with patient today was 41 minutes which consisted of chart revdiew, discussing diagnosis, work up ,treatment, and filled out [facility]  and documentation.

## 2021-05-31 NOTE — Patient Instructions (Addendum)
Dementia- continue aricept. [facility]. Pt is coorperative. No hallucination and no paranoia. He will be moving to [place] close to his daughter.  Copd stable. Continue current inhalers.   For high cholesterol continue atorvastatin. Get lipid panel today.  For mild anemia get cbc and iron level.  Htn- bp well controlled now. No longer on bp meds.  Follow up as needed.

## 2021-06-01 LAB — LIPID PANEL
Cholesterol: 175 mg/dL (ref 0–200)
HDL: 63.2 mg/dL (ref >39.00)
LDL Cholesterol: 96 mg/dL (ref 0–99)
NonHDL: 111.94
Total CHOL/HDL Ratio: 3
Triglycerides: 78 mg/dL (ref 0.0–149.0)
VLDL: 15.6 mg/dL (ref 0.0–40.0)

## 2021-06-01 LAB — CBC WITH DIFFERENTIAL/PLATELET
Basophils Absolute: 0.1 10*3/uL (ref 0.0–0.1)
Basophils Relative: 0.8 % (ref 0.0–3.0)
Eosinophils Absolute: 0.1 10*3/uL (ref 0.0–0.7)
Eosinophils Relative: 1.3 % (ref 0.0–5.0)
HCT: 43.4 % (ref 39.0–52.0)
Hemoglobin: 14.1 g/dL (ref 13.0–17.0)
Lymphocytes Relative: 13.1 % (ref 12.0–46.0)
Lymphs Abs: 1.2 10*3/uL (ref 0.7–4.0)
MCHC: 32.6 g/dL (ref 30.0–36.0)
MCV: 95.2 fl (ref 78.0–100.0)
Monocytes Absolute: 0.6 10*3/uL (ref 0.1–1.0)
Monocytes Relative: 6.5 % (ref 3.0–12.0)
Neutro Abs: 7.5 10*3/uL (ref 1.4–7.7)
Neutrophils Relative %: 78.3 % — ABNORMAL HIGH (ref 43.0–77.0)
Platelets: 287 10*3/uL (ref 150.0–400.0)
RBC: 4.56 Mil/uL (ref 4.22–5.81)
RDW: 13 % (ref 11.5–15.5)
WBC: 9.5 10*3/uL (ref 4.0–10.5)

## 2021-06-01 LAB — COMPREHENSIVE METABOLIC PANEL
ALT: 11 U/L (ref 0–53)
AST: 22 U/L (ref 0–37)
Albumin: 3.8 g/dL (ref 3.5–5.2)
Alkaline Phosphatase: 87 U/L (ref 39–117)
BUN: 21 mg/dL (ref 6–23)
CO2: 36 mEq/L — ABNORMAL HIGH (ref 19–32)
Calcium: 9.4 mg/dL (ref 8.4–10.5)
Chloride: 100 mEq/L (ref 96–112)
Creatinine, Ser: 1.24 mg/dL (ref 0.40–1.50)
GFR: 53.06 mL/min — ABNORMAL LOW (ref >60.00)
Glucose, Bld: 151 mg/dL — ABNORMAL HIGH (ref 70–99)
Potassium: 4.8 mEq/L (ref 3.5–5.1)
Sodium: 138 mEq/L (ref 135–145)
Total Bilirubin: 0.5 mg/dL (ref 0.2–1.2)
Total Protein: 7 g/dL (ref 6.0–8.3)

## 2021-06-01 LAB — IRON: Iron: 104 ug/dL (ref 42–165)

## 2021-06-04 ENCOUNTER — Encounter: Payer: Self-pay | Admitting: Medical

## 2021-06-07 ENCOUNTER — Other Ambulatory Visit (INDEPENDENT_AMBULATORY_CARE_PROVIDER_SITE_OTHER): Payer: Medicare Other

## 2021-06-07 ENCOUNTER — Encounter: Payer: Self-pay | Admitting: Medical

## 2021-06-07 ENCOUNTER — Other Ambulatory Visit: Payer: Self-pay

## 2021-06-07 ENCOUNTER — Telehealth: Payer: Self-pay | Admitting: Medical

## 2021-06-07 DIAGNOSIS — Z111 Encounter for screening for respiratory tuberculosis: Secondary | ICD-10-CM

## 2021-06-07 NOTE — Addendum Note (Signed)
Addended by: Mervin Kung A on: 06/07/2021 01:22 PM   Modules accepted: Orders

## 2021-06-07 NOTE — Telephone Encounter (Signed)
Error

## 2021-06-11 LAB — QUANTIFERON-TB GOLD PLUS
Mitogen-NIL: >10 IU/mL
NIL: 0.12 IU/mL
QuantiFERON-TB Gold Plus: NEGATIVE
TB1-NIL: <0 IU/mL
TB2-NIL: <0 IU/mL

## 2021-07-05 ENCOUNTER — Other Ambulatory Visit (HOSPITAL_BASED_OUTPATIENT_CLINIC_OR_DEPARTMENT_OTHER): Payer: Self-pay

## 2021-10-13 ENCOUNTER — Encounter (HOSPITAL_COMMUNITY): Payer: Self-pay

## 2021-10-13 ENCOUNTER — Emergency Department (HOSPITAL_COMMUNITY): Payer: Medicare Other

## 2021-10-13 ENCOUNTER — Emergency Department (HOSPITAL_COMMUNITY)
Admission: EM | Admit: 2021-10-13 | Discharge: 2021-10-14 | Disposition: A | Discharge status: Skilled Nursing Facility | Payer: Medicare Other | Source: Home / Self Care | Attending: Emergency Medicine | Admitting: Emergency Medicine

## 2021-10-13 ENCOUNTER — Emergency Department (HOSPITAL_COMMUNITY)
Admission: EM | Admit: 2021-10-13 | Discharge: 2021-10-13 | Disposition: A | Discharge status: Home / Self Care | Payer: Medicare Other | Attending: Emergency Medicine | Admitting: Emergency Medicine

## 2021-10-13 ENCOUNTER — Other Ambulatory Visit: Payer: Self-pay

## 2021-10-13 DIAGNOSIS — S0181XA Laceration without foreign body of other part of head, initial encounter: Secondary | ICD-10-CM | POA: Diagnosis not present

## 2021-10-13 DIAGNOSIS — Z20822 Contact with and (suspected) exposure to covid-19: Secondary | ICD-10-CM | POA: Diagnosis not present

## 2021-10-13 DIAGNOSIS — S01111A Laceration without foreign body of right eyelid and periocular area, initial encounter: Secondary | ICD-10-CM | POA: Insufficient documentation

## 2021-10-13 DIAGNOSIS — F0390 Unspecified dementia without behavioral disturbance: Secondary | ICD-10-CM | POA: Insufficient documentation

## 2021-10-13 DIAGNOSIS — X58XXXA Exposure to other specified factors, initial encounter: Secondary | ICD-10-CM | POA: Insufficient documentation

## 2021-10-13 DIAGNOSIS — R0602 Shortness of breath: Secondary | ICD-10-CM | POA: Diagnosis present

## 2021-10-13 DIAGNOSIS — Z7951 Long term (current) use of inhaled steroids: Secondary | ICD-10-CM | POA: Insufficient documentation

## 2021-10-13 DIAGNOSIS — D72829 Elevated white blood cell count, unspecified: Secondary | ICD-10-CM | POA: Insufficient documentation

## 2021-10-13 DIAGNOSIS — J441 Chronic obstructive pulmonary disease with (acute) exacerbation: Secondary | ICD-10-CM | POA: Insufficient documentation

## 2021-10-13 DIAGNOSIS — J069 Acute upper respiratory infection, unspecified: Secondary | ICD-10-CM

## 2021-10-13 DIAGNOSIS — R410 Disorientation, unspecified: Secondary | ICD-10-CM | POA: Diagnosis not present

## 2021-10-13 DIAGNOSIS — Z87891 Personal history of nicotine dependence: Secondary | ICD-10-CM | POA: Diagnosis not present

## 2021-10-13 DIAGNOSIS — R0902 Hypoxemia: Secondary | ICD-10-CM

## 2021-10-13 LAB — I-STAT VENOUS BLOOD GAS, ED
Acid-Base Excess: 3 mmol/L — ABNORMAL HIGH (ref 0.0–2.0)
Bicarbonate: 27.5 mmol/L (ref 20.0–28.0)
Calcium, Ion: 1 mmol/L — ABNORMAL LOW (ref 1.15–1.40)
HCT: 42 % (ref 39.0–52.0)
Hemoglobin: 14.3 g/dL (ref 13.0–17.0)
O2 Saturation: 70 %
Potassium: 4.4 mmol/L (ref 3.5–5.1)
Sodium: 138 mmol/L (ref 135–145)
TCO2: 29 mmol/L (ref 22–32)
pCO2, Ven: 41.4 mmHg — ABNORMAL LOW (ref 44–60)
pH, Ven: 7.43 (ref 7.25–7.43)
pO2, Ven: 36 mmHg (ref 32–45)

## 2021-10-13 LAB — CBC WITH DIFFERENTIAL/PLATELET
Abs Immature Granulocytes: 0.06 10*3/uL (ref 0.00–0.07)
Basophils Absolute: 0 10*3/uL (ref 0.0–0.1)
Basophils Relative: 0 %
Eosinophils Absolute: 0 10*3/uL (ref 0.0–0.5)
Eosinophils Relative: 0 %
HCT: 43.1 % (ref 39.0–52.0)
Hemoglobin: 14 g/dL (ref 13.0–17.0)
Immature Granulocytes: 1 %
Lymphocytes Relative: 8 %
Lymphs Abs: 1 10*3/uL (ref 0.7–4.0)
MCH: 31.6 pg (ref 26.0–34.0)
MCHC: 32.5 g/dL (ref 30.0–36.0)
MCV: 97.3 fL (ref 80.0–100.0)
Monocytes Absolute: 1 10*3/uL (ref 0.1–1.0)
Monocytes Relative: 8 %
Neutro Abs: 10.3 10*3/uL — ABNORMAL HIGH (ref 1.7–7.7)
Neutrophils Relative %: 83 %
Platelets: 249 10*3/uL (ref 150–400)
RBC: 4.43 MIL/uL (ref 4.22–5.81)
RDW: 13 % (ref 11.5–15.5)
WBC: 12.3 10*3/uL — ABNORMAL HIGH (ref 4.0–10.5)
nRBC: 0 % (ref 0.0–0.2)

## 2021-10-13 LAB — BASIC METABOLIC PANEL
Anion gap: 14 (ref 5–15)
BUN: 20 mg/dL (ref 8–23)
CO2: 28 mmol/L (ref 22–32)
Calcium: 9 mg/dL (ref 8.9–10.3)
Chloride: 97 mmol/L — ABNORMAL LOW (ref 98–111)
Creatinine, Ser: 1.16 mg/dL (ref 0.61–1.24)
GFR, Estimated: >60 mL/min (ref >60)
Glucose, Bld: 123 mg/dL — ABNORMAL HIGH (ref 70–99)
Potassium: 4.4 mmol/L (ref 3.5–5.1)
Sodium: 139 mmol/L (ref 135–145)

## 2021-10-13 LAB — BRAIN NATRIURETIC PEPTIDE: B Natriuretic Peptide: 97.6 pg/mL (ref 0.0–100.0)

## 2021-10-13 LAB — SARS CORONAVIRUS 2 BY RT PCR: SARS Coronavirus 2 by RT PCR: NEGATIVE

## 2021-10-13 MED ORDER — MAGNESIUM SULFATE 2 GM/50ML IV SOLN
2.0000 g | Freq: Once | INTRAVENOUS | Status: AC
Start: 2021-10-13 — End: 2021-10-13
  Administered 2021-10-13: 2 g via INTRAVENOUS
  Filled 2021-10-13: qty 50

## 2021-10-13 MED ORDER — LORAZEPAM 2 MG/ML IJ SOLN
0.5000 mg | Freq: Once | INTRAMUSCULAR | Status: AC
  Administered 2021-10-13: 0.5 mg via INTRAVENOUS
  Filled 2021-10-13: qty 1

## 2021-10-13 MED ORDER — STERILE WATER FOR INJECTION IJ SOLN
INTRAMUSCULAR | Status: AC
  Administered 2021-10-13: 2 mL
  Filled 2021-10-13: qty 10

## 2021-10-13 MED ORDER — LIDOCAINE HCL (PF) 1 % IJ SOLN
5.0000 mL | Freq: Once | INTRAMUSCULAR | Status: AC
  Administered 2021-10-13: 5 mL
  Filled 2021-10-13: qty 5

## 2021-10-13 MED ORDER — METHYLPREDNISOLONE SODIUM SUCC 125 MG IJ SOLR
125.0000 mg | Freq: Once | INTRAMUSCULAR | Status: AC
  Administered 2021-10-13: 125 mg via INTRAVENOUS
  Filled 2021-10-13: qty 2

## 2021-10-13 MED ORDER — DOXYCYCLINE HYCLATE 100 MG PO CAPS: 100.0000 mg | ORAL_CAPSULE | Freq: Two times a day (BID) | ORAL | 0 refills | Status: DC

## 2021-10-13 MED ORDER — IPRATROPIUM-ALBUTEROL 0.5-2.5 (3) MG/3ML IN SOLN
3.0000 mL | Freq: Once | RESPIRATORY_TRACT | Status: AC
  Administered 2021-10-13: 3 mL via RESPIRATORY_TRACT
  Filled 2021-10-13: qty 3

## 2021-10-13 MED ORDER — LORAZEPAM 2 MG/ML IJ SOLN: 0.5000 mg | Freq: Once | INTRAMUSCULAR | Status: DC

## 2021-10-13 MED ORDER — PREDNISONE 20 MG PO TABS: 60.0000 mg | ORAL_TABLET | Freq: Every day | ORAL | 0 refills | Status: DC

## 2021-10-13 NOTE — ED Triage Notes (Addendum)
Pt BIB [facility] for eval of SOB, found by staff laying in bed this AM gurgling on secretions Responsive, EMS reports fires initial sats were 79% on RA, improved to 97% on the CPAP. [facility].

## 2021-10-13 NOTE — ED Notes (Signed)
Pt given ice cream. No needs

## 2021-10-13 NOTE — ED Notes (Addendum)
SATURATION QUALIFICATIONS: (This note is used to comply with regulatory documentation for home oxygen)  Patient Saturations on Room Air at Rest = 88%  Patient Saturations on Room Air while Ambulating 79 %  Patient Saturations on  Liters of oxygen while Ambulating = 90%  Please briefly explain why patient needs home oxygen: COPD

## 2021-10-13 NOTE — ED Provider Notes (Signed)
[facility]   CSN: [id number] Arrival date & time: 10/13/21  1204     History  Chief Complaint  Patient presents with   Shortness of Breath    Jonathan Harmon is a 88 y.o. male.  He has a history of COPD and memory impairment.  He was brought in from his memory care facility by EMS after they were called for shortness of breath.  EMS said initial saturations were in the 70s with rales and so placed him on CPAP.  On arrival patient denies any complaints.  He does endorse a little bit of shortness of breath but he said its been going on a while.  Former smoker.  Denies any cough or chest pain.  No fevers or chills.  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  The history is provided by the patient and the EMS personnel.  Shortness of Breath Severity:  Severe Progression:  Improving Chronicity:  New Relieved by:  Nothing Worsened by:  Nothing Ineffective treatments:  None tried Associated symptoms: no abdominal pain, no chest pain, no cough, no fever, no headaches, no neck pain, no rash and no sore throat        Home Medications Prior to Admission medications   Medication Sig Start Date End Date Taking? Authorizing Provider  albuterol (VENTOLIN HFA) 108 (90 Base) MCG/ACT inhaler inhale 2 puffs by mouth into the lungs every 6 hours as needed for wheezing and shortness of breath 08/24/20 08/24/21  Saguier, Ramon DredgeEdward, PA-C  atorvastatin (LIPITOR) 40 MG tablet TAKE 1 TABLET BY MOUTH ONCE DAILY 09/03/19 05/10/21  Saguier, Ramon DredgeEdward, PA-C  donepezil (ARICEPT) 10 MG tablet Take 1 tablet (10 mg total) by mouth daily. 05/10/21   Marcos EkeWertman, Sara E, PA-C  levofloxacin (LEVAQUIN) 500 MG tablet Take 1 tablet (500 mg total) by mouth daily for 2 days. 12/22/20     Tiotropium Bromide-Olodaterol (STIOLTO RESPIMAT) 2.5-2.5 MCG/ACT AERS Inhale 1 puff into the lungs daily. 10/08/20   Leslye PeerByrum, Robert S, MD      Allergies    Penicillin g and Penicillins     Review of Systems   Review of Systems  Constitutional:  Negative for fever.  HENT:  Negative for sore throat.   Eyes:  Negative for visual disturbance.  Respiratory:  Positive for shortness of breath. Negative for cough.   Cardiovascular:  Negative for chest pain.  Gastrointestinal:  Negative for abdominal pain.  Genitourinary:  Negative for dysuria.  Musculoskeletal:  Negative for neck pain.  Skin:  Negative for rash.  Neurological:  Negative for headaches.    Physical Exam Updated Vital Signs BP 110/70 (BP Location: Right Arm)   Pulse 85   Temp (!) 97.5 F (36.4 C) (Oral)   Resp 17   Ht 5\' 8"  (1.727 m)   Wt 51 kg   SpO2 93%   BMI 17.10 kg/m  Physical Exam Vitals and nursing note reviewed.  Constitutional:      General: He is not in acute distress.    Appearance: He is well-developed. He is cachectic.  HENT:     Head: Normocephalic and atraumatic.  Eyes:     Conjunctiva/sclera: Conjunctivae normal.  Cardiovascular:     Rate and Rhythm: Normal rate and regular rhythm.     Heart sounds: No murmur heard. Pulmonary:     Effort: Tachypnea and accessory muscle usage present. No respiratory distress.     Breath sounds: Rhonchi present.  Abdominal:  Palpations: Abdomen is soft.     Tenderness: There is no abdominal tenderness.  Musculoskeletal:        General: No swelling. Normal range of motion.     Cervical back: Neck supple.     Right lower leg: No tenderness.     Left lower leg: No tenderness.  Skin:    General: Skin is warm and dry.     Capillary Refill: Capillary refill takes less than 2 seconds.  Neurological:     General: No focal deficit present.     Mental Status: He is alert. He is disoriented.     ED Results / Procedures / Treatments   Labs (all labs ordered are listed, but only abnormal results are displayed) Labs Reviewed  BASIC METABOLIC PANEL - Abnormal; Notable for the following components:      Result Value   Chloride 97 (*)     Glucose, Bld 123 (*)    All other components within normal limits  CBC WITH DIFFERENTIAL/PLATELET - Abnormal; Notable for the following components:   WBC 12.3 (*)    Neutro Abs 10.3 (*)    All other components within normal limits  I-STAT VENOUS BLOOD GAS, ED - Abnormal; Notable for the following components:   pCO2, Ven 41.4 (*)    Acid-Base Excess 3.0 (*)    Calcium, Ion 1.00 (*)    All other components within normal limits  SARS CORONAVIRUS 2 BY RT PCR  BRAIN NATRIURETIC PEPTIDE    EKG EKG Interpretation  Date/Time:  Wednesday October 13 2021 12:11:04 EDT Ventricular Rate:  83 PR Interval:  152 QRS Duration: 86 QT Interval:  369 QTC Calculation: 434 R Axis:   90 Text Interpretation: Sinus arrhythmia Consider left atrial enlargement Borderline right axis deviation No significant change since 4/22 Confirmed by Meridee Score [phone]) on 10/13/2021 12:14:52 PM  Radiology DG Chest Port 1 View  Result Date: 10/13/2021 CLINICAL DATA:  sob EXAM: PORTABLE CHEST 1 VIEW COMPARISON:  December 19, 2020 FINDINGS: The heart size and mediastinal contours are stable. No cardiomegaly. No focal consolidation, pleural effusion or vascular congestion. Again seen are the extensive bilateral calcified pleural plaques. Moderate thoracolumbar spondylosis. IMPRESSION: No active disease. COPD changes. Stable extensive bilateral calcified pleural plaques. Electronically Signed   By: Marjo Bicker M.D.   On: 10/13/2021 13:10    Procedures Procedures    Medications Ordered in ED Medications  ipratropium-albuterol (DUONEB) 0.5-2.5 (3) MG/3ML nebulizer solution 3 mL (has no administration in time range)  magnesium sulfate IVPB 2 g 50 mL (has no administration in time range)  methylPREDNISolone sodium succinate (SOLU-MEDROL) 125 mg/2 mL injection 125 mg (has no administration in time range)    ED Course/ Medical Decision Making/ A&P Clinical Course as of 10/13/21 1730  Wed Oct 13, 2021  1232 Chest x-ray  interpreted by me as multiple calcified lesions within the lungs.  Flattened diaphragms.  Awaiting radiology reading. [MB]  1352 Patient in no distress and feels better.  He wants to go home.  He denies home oxygen use although notes look like he is normally on 3 L. [MB]    Clinical Course User Index [MB] Terrilee Files, MD                           Medical Decision Making Amount and/or Complexity of Data Reviewed Labs: ordered. Radiology: ordered.  Risk Prescription drug management.   This patient complains of shortness of breath  hypoxia; this involves an extensive number of treatment Options and is a complaint that carries with it a high risk of complications and morbidity. The differential includes COPD, CHF, COVID, flu, pneumonia, pneumothorax, PE  I ordered, reviewed and interpreted labs, which included CBC with mildly elevated white count normal hemoglobin, chemistries fairly unremarkable, VBG without significant acidosis, COVID-negative, BNP normal I ordered medication IV steroids DuoNeb and IV magnesium.  And reviewed PMP when indicated. I ordered imaging studies which included chest x-ray and I independently    visualized and interpreted imaging which showed old calcified granulomas consistent with asbestosis Additional history obtained from EMS Previous records obtained and reviewed in epic including prior pulmonary visits  Cardiac monitoring reviewed, normal sinus rhythm Social determinants considered, patient with dementia unable to advocate for self Critical Interventions: None  After the interventions stated above, I reevaluated the patient and found patient's respirations to be more comfortable satting well on nasal cannula Admission and further testing considered, no indications for admission at this time.  Will return back to his facility where they can manage his dementia better.          Final Clinical Impression(s) / ED Diagnoses Final diagnoses:   COPD exacerbation (HCC)    Rx / DC Orders ED Discharge Orders          Ordered    predniSONE (DELTASONE) 20 MG tablet  Daily        10/13/21 1420    doxycycline (VIBRAMYCIN) 100 MG capsule  2 times daily        10/13/21 1420              Terrilee Files, MD 10/13/21 1732

## 2021-10-13 NOTE — Care Management (Incomplete)
ED RNCM  consulted concerning home oxygen needs.  Oxygen qualifying note and order in chart.  Patient is a resident at [facility].  RN spoke with Armed forces logistics/support/administrative officer who confirms patient may return once stable with oxygen.

## 2021-10-13 NOTE — ED Triage Notes (Signed)
Pt arrived via [place] from [place] [facility] [place]. Pt was just d/c'd from here, PT[facility] took pt back to [facility] and they refused to take pt, because they said the pt doesn't normally wear 02.

## 2021-10-13 NOTE — Discharge Instructions (Addendum)
You were seen in the emergency department for increased shortness of breath.  Your chest x-ray did not show any definite pneumonia.  We are starting you on some antibiotics and steroids for COPD exacerbation.  Please continue oxygen as needed.  Follow-up with primary care doctor.  Return to the emergency department if any worsening or concerning symptoms.

## 2021-10-13 NOTE — ED Notes (Signed)
RN called [facility] and spoke to Diplomatic Services operational officer who got this RN's phone number to call back. Tyra is nurse on duty there now.

## 2021-10-13 NOTE — ED Notes (Addendum)
RN talking to MD about oxygen need in MD office. MD received call that pt had gotten up and fallen onto his head. Laceration to R eyebrow and small skin tear on R forearm. Lacerations cleaned and tegarderm placed on forearm. MD to suture lac to eyebrow. Pt denies neck or any other pain.  Pt's RA sats lying in bed are 88 to 92% on RA.   RN spoke to [place] [phone]) the Publishing copy at [employer]. This is her direct line for updates.

## 2021-10-13 NOTE — ED Notes (Signed)
Patient transported to CT 

## 2021-10-13 NOTE — ED Provider Notes (Signed)
[facility] EMERGENCY DEPARTMENT Provider Note   CSN: [id number] Arrival date & time: 10/13/21  1820     History  Chief Complaint  Patient presents with   placement issue    Jonathan Harmon is a 88 y.o. male.  HPI Patient returns back for memory care.  Had been seen earlier today and develops to a COPD exacerbation.  Had been pox upon arrival but had improved and was on his reported baseline oxygen.  Patient was taken back to his assisted living and reportedly was sent back because they say he is not on oxygen at baseline.  I reviewed notes from admissions to [facility].  It appears that he was on oxygen at baseline.  However not sure if it was when he was at the hospital.  That admission was from about 10 months ago.  Patient cannot really provide much history.    Home Medications Prior to Admission medications   Medication Sig Start Date End Date Taking? Authorizing Provider  albuterol (VENTOLIN HFA) 108 (90 Base) MCG/ACT inhaler inhale 2 puffs by mouth into the lungs every 6 hours as needed for wheezing and shortness of breath 08/24/20 08/24/21  Saguier, Ramon Dredge, PA-C  atorvastatin (LIPITOR) 40 MG tablet TAKE 1 TABLET BY MOUTH ONCE DAILY 09/03/19 05/10/21  Saguier, Ramon Dredge, PA-C  donepezil (ARICEPT) 10 MG tablet Take 1 tablet (10 mg total) by mouth daily. 05/10/21   Marcos Eke, PA-C  doxycycline (VIBRAMYCIN) 100 MG capsule Take 1 capsule (100 mg total) by mouth 2 (two) times daily. 10/13/21   Terrilee Files, MD  levofloxacin (LEVAQUIN) 500 MG tablet Take 1 tablet (500 mg total) by mouth daily for 2 days. 12/22/20     predniSONE (DELTASONE) 20 MG tablet Take 3 tablets (60 mg total) by mouth daily. 10/13/21   Terrilee Files, MD  Tiotropium Bromide-Olodaterol (STIOLTO RESPIMAT) 2.5-2.5 MCG/ACT AERS Inhale 1 puff into the lungs daily. 10/08/20   Leslye Peer, MD      Allergies    Penicillin g and Penicillins    Review of Systems   Review of  Systems  Physical Exam Updated Vital Signs BP 130/85   Pulse (!) 54   Resp 20   Ht 5\' 8"  (1.727 m)   Wt 51 kg   SpO2 91%   BMI 17.10 kg/m  Physical Exam Vitals reviewed.  HENT:     Head: Normocephalic.  Cardiovascular:     Rate and Rhythm: Normal rate.  Pulmonary:     Breath sounds: No wheezing or rhonchi.  Abdominal:     Tenderness: There is no abdominal tenderness.  Musculoskeletal:        General: No tenderness.  Neurological:     Mental Status: He is alert. Mental status is at baseline.     Comments: Does have reported baseline dementia.     ED Results / Procedures / Treatments   Labs (all labs ordered are listed, but only abnormal results are displayed) Labs Reviewed - No data to display  EKG None  Radiology CT HEAD WO CONTRAST ( )  Result Date: 10/13/2021 CLINICAL DATA:  Possible recent fall, initial encounter EXAM: CT HEAD WITHOUT CONTRAST CT MAXILLOFACIAL WITHOUT CONTRAST CT CERVICAL SPINE WITHOUT CONTRAST TECHNIQUE: Multidetector CT imaging of the head, cervical spine, and maxillofacial structures were performed using the standard protocol without intravenous contrast. Multiplanar CT image reconstructions of the cervical spine and maxillofacial structures were also generated. RADIATION DOSE REDUCTION: This exam was performed according to  the departmental dose-optimization program which includes automated exposure control, adjustment of the mA and/or kV according to patient size and/or use of iterative reconstruction technique. COMPARISON:  None Available. FINDINGS: CT HEAD FINDINGS Brain: No evidence of acute infarction, hemorrhage, hydrocephalus, extra-axial collection or mass lesion/mass effect. Chronic atrophic and ischemic changes are noted. Vascular: No hyperdense vessel or unexpected calcification. Skull: Normal. Negative for fracture or focal lesion. Other: None. CT MAXILLOFACIAL FINDINGS Osseous: No fracture or mandibular dislocation. No destructive  process. Orbits: Orbits and their contents are within normal limits. Sinuses: Paranasal sinuses are well aerated without mucosal abnormality. Soft tissues: Surrounding soft tissue structures are within normal limits. CT CERVICAL SPINE FINDINGS Alignment: Within normal limits. Skull base and vertebrae: 7 cervical segments are well visualized. Vertebral body height is well maintained. Well corticated densities are noted adjacent to the tip of the odontoid consistent with prior fracture and nonunion. No acute fracture or acute facet abnormality is noted. Multilevel osteophytic changes and facet hypertrophic changes are noted. Soft tissues and spinal canal: Surrounding soft tissue structures are within normal limits. Vascular calcifications are seen. Upper chest: Emphysematous changes are noted in the lung apices. Mild pleural plaquing is seen as well. Other: None IMPRESSION: CT of the head: No acute intracranial abnormality noted. Chronic atrophic and ischemic changes. CT of the maxillofacial bones: No acute bony abnormality noted. CT of the cervical spine: Multilevel degenerative change. No acute abnormality noted. Electronically Signed   By: Alcide Clever M.D.   On: 10/13/2021 21:22   CT Maxillofacial Wo Contrast  Result Date: 10/13/2021 CLINICAL DATA:  Possible recent fall, initial encounter EXAM: CT HEAD WITHOUT CONTRAST CT MAXILLOFACIAL WITHOUT CONTRAST CT CERVICAL SPINE WITHOUT CONTRAST TECHNIQUE: Multidetector CT imaging of the head, cervical spine, and maxillofacial structures were performed using the standard protocol without intravenous contrast. Multiplanar CT image reconstructions of the cervical spine and maxillofacial structures were also generated. RADIATION DOSE REDUCTION: This exam was performed according to the departmental dose-optimization program which includes automated exposure control, adjustment of the mA and/or kV according to patient size and/or use of iterative reconstruction technique.  COMPARISON:  None Available. FINDINGS: CT HEAD FINDINGS Brain: No evidence of acute infarction, hemorrhage, hydrocephalus, extra-axial collection or mass lesion/mass effect. Chronic atrophic and ischemic changes are noted. Vascular: No hyperdense vessel or unexpected calcification. Skull: Normal. Negative for fracture or focal lesion. Other: None. CT MAXILLOFACIAL FINDINGS Osseous: No fracture or mandibular dislocation. No destructive process. Orbits: Orbits and their contents are within normal limits. Sinuses: Paranasal sinuses are well aerated without mucosal abnormality. Soft tissues: Surrounding soft tissue structures are within normal limits. CT CERVICAL SPINE FINDINGS Alignment: Within normal limits. Skull base and vertebrae: 7 cervical segments are well visualized. Vertebral body height is well maintained. Well corticated densities are noted adjacent to the tip of the odontoid consistent with prior fracture and nonunion. No acute fracture or acute facet abnormality is noted. Multilevel osteophytic changes and facet hypertrophic changes are noted. Soft tissues and spinal canal: Surrounding soft tissue structures are within normal limits. Vascular calcifications are seen. Upper chest: Emphysematous changes are noted in the lung apices. Mild pleural plaquing is seen as well. Other: None IMPRESSION: CT of the head: No acute intracranial abnormality noted. Chronic atrophic and ischemic changes. CT of the maxillofacial bones: No acute bony abnormality noted. CT of the cervical spine: Multilevel degenerative change. No acute abnormality noted. Electronically Signed   By: Alcide Clever M.D.   On: 10/13/2021 21:22   CT Cervical Spine Wo  Contrast  Result Date: 10/13/2021 CLINICAL DATA:  Possible recent fall, initial encounter EXAM: CT HEAD WITHOUT CONTRAST CT MAXILLOFACIAL WITHOUT CONTRAST CT CERVICAL SPINE WITHOUT CONTRAST TECHNIQUE: Multidetector CT imaging of the head, cervical spine, and maxillofacial structures  were performed using the standard protocol without intravenous contrast. Multiplanar CT image reconstructions of the cervical spine and maxillofacial structures were also generated. RADIATION DOSE REDUCTION: This exam was performed according to the departmental dose-optimization program which includes automated exposure control, adjustment of the mA and/or kV according to patient size and/or use of iterative reconstruction technique. COMPARISON:  None Available. FINDINGS: CT HEAD FINDINGS Brain: No evidence of acute infarction, hemorrhage, hydrocephalus, extra-axial collection or mass lesion/mass effect. Chronic atrophic and ischemic changes are noted. Vascular: No hyperdense vessel or unexpected calcification. Skull: Normal. Negative for fracture or focal lesion. Other: None. CT MAXILLOFACIAL FINDINGS Osseous: No fracture or mandibular dislocation. No destructive process. Orbits: Orbits and their contents are within normal limits. Sinuses: Paranasal sinuses are well aerated without mucosal abnormality. Soft tissues: Surrounding soft tissue structures are within normal limits. CT CERVICAL SPINE FINDINGS Alignment: Within normal limits. Skull base and vertebrae: 7 cervical segments are well visualized. Vertebral body height is well maintained. Well corticated densities are noted adjacent to the tip of the odontoid consistent with prior fracture and nonunion. No acute fracture or acute facet abnormality is noted. Multilevel osteophytic changes and facet hypertrophic changes are noted. Soft tissues and spinal canal: Surrounding soft tissue structures are within normal limits. Vascular calcifications are seen. Upper chest: Emphysematous changes are noted in the lung apices. Mild pleural plaquing is seen as well. Other: None IMPRESSION: CT of the head: No acute intracranial abnormality noted. Chronic atrophic and ischemic changes. CT of the maxillofacial bones: No acute bony abnormality noted. CT of the cervical spine:  Multilevel degenerative change. No acute abnormality noted. Electronically Signed   By: Alcide Clever M.D.   On: 10/13/2021 21:22   DG Chest Port 1 View  Result Date: 10/13/2021 CLINICAL DATA:  sob EXAM: PORTABLE CHEST 1 VIEW COMPARISON:  December 19, 2020 FINDINGS: The heart size and mediastinal contours are stable. No cardiomegaly. No focal consolidation, pleural effusion or vascular congestion. Again seen are the extensive bilateral calcified pleural plaques. Moderate thoracolumbar spondylosis. IMPRESSION: No active disease. COPD changes. Stable extensive bilateral calcified pleural plaques. Electronically Signed   By: Marjo Bicker M.D.   On: 10/13/2021 13:10    Procedures .Marland KitchenLaceration Repair  Date/Time: 10/13/2021 11:16 PM  Performed by: Benjiman Core, MD Authorized by: Benjiman Core, MD   Consent:    Consent obtained:  Verbal   Consent given by:  Patient   Risks discussed:  Infection, need for additional repair, nerve damage, pain, tendon damage, poor cosmetic result and poor wound healing   Alternatives discussed:  No treatment, delayed treatment and observation Anesthesia:    Anesthesia method:  Local infiltration   Local anesthetic:  Lidocaine 1% w/o epi Laceration details:    Location:  Face   Face location:  R eyebrow   Length (cm):  2.5 Pre-procedure details:    Preparation:  Patient was prepped and draped in usual sterile fashion Exploration:    Limited defect created (wound extended): no     Contaminated: no   Treatment:    Area cleansed with:  Saline   Amount of cleaning:  Standard Skin repair:    Repair method:  Sutures   Suture size:  4-0   Wound skin closure material used: vicryl rapide.  Number of sutures:  8 Approximation:    Approximation:  Close Repair type:    Repair type:  Simple Post-procedure details:    Dressing:  Sterile dressing   Procedure completion:  Tolerated well, no immediate complications     Medications Ordered in  ED Medications  lidocaine (PF) (XYLOCAINE) 1 % injection 5 mL (5 mLs Infiltration Given by Other 10/13/21 2018)    ED Course/ Medical Decision Making/ A&P                           Medical Decision Making Amount and/or Complexity of Data Reviewed Radiology: ordered.  Risk Prescription drug management.   Patient returns after COPD exacerbation.  Reportedly is on baseline oxygen but may not be most recently.  Does have mild hypoxia on room air but also will hover around 90%.  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  Potentially does have the baseline hypoxia.  Have discussed with transitions of care we will help try and arrange oxygen at home.  However while in the ER he did have a fall.  Has laceration to right periorbital area.  Will require suturing.  We will get imaging.  Also skin tear to right elbow but no underlying tenderness.  Do not feel finding x-ray at this time.  Head CT reassuring.  Has a cervical spine CT.  Wound closed.  Observable stitches.  Sats down to 79% with ambulating.  Oxygen has been ordered for home.  Pending availability.        Final Clinical Impression(s) / ED Diagnoses Final diagnoses:  Upper respiratory tract infection, unspecified type  Facial laceration, initial encounter  Hypoxia    Rx / DC Orders ED Discharge Orders          Ordered    For home use only DME oxygen        10/13/21 2049              Benjiman Core, MD 10/13/21 2318

## 2021-10-13 NOTE — ED Notes (Signed)
Pts daughter asking to be called when MD is at bedside, [phone].

## 2021-10-13 NOTE — Discharge Instructions (Addendum)
Return for any problem.   Patient should wear 3 L nasal cannula O2.

## 2021-10-13 NOTE — ED Notes (Signed)
This NT seen the Pt fall from across the department, rushed over to the Pt to check for injuries. Pt was able to stand with assistance and walk back to the room. Pt was given hospital socks and vitals were obtained. Pt was put on the bed monitor to reduce risk of falls. Nurse and doctor was informed. Doctor came to the room. Nurse was able to clean Pt's injuries. Pt had a skin tear on his right elbow and on his right eyebrow.

## 2021-10-13 NOTE — ED Notes (Signed)
[name], son, called with update. He was appreciative.

## 2021-10-14 NOTE — ED Notes (Signed)
PTAR called to transport patient  

## 2021-10-14 NOTE — Progress Notes (Signed)
CSW spoke with Ian Malkin with [employer] and confirmed patients oxygen will be delivered to his room this morning. Patient then will need to be discharge back to [place] at [place].

## 2021-10-14 NOTE — ED Notes (Signed)
Pt ambulatory to bathroom. RN applied new sheets to pts bed

## 2021-10-18 ENCOUNTER — Emergency Department (HOSPITAL_COMMUNITY): Payer: Medicare Other

## 2021-10-18 ENCOUNTER — Encounter (HOSPITAL_COMMUNITY): Payer: Self-pay | Admitting: Emergency Medicine

## 2021-10-18 ENCOUNTER — Inpatient Hospital Stay (HOSPITAL_COMMUNITY)
Admission: EM | Admit: 2021-10-18 | Discharge: 2021-10-22 | DRG: 193 | Disposition: A | Discharge status: Skilled Nursing Facility | Payer: Medicare Other | Source: Skilled Nursing Facility | Attending: Internal Medicine | Admitting: Internal Medicine

## 2021-10-18 ENCOUNTER — Telehealth: Payer: Self-pay | Admitting: Medical

## 2021-10-18 DIAGNOSIS — Z681 Body mass index (BMI) 19 or less, adult: Secondary | ICD-10-CM

## 2021-10-18 DIAGNOSIS — Z880 Allergy status to penicillin: Secondary | ICD-10-CM | POA: Diagnosis not present

## 2021-10-18 DIAGNOSIS — I10 Essential (primary) hypertension: Secondary | ICD-10-CM | POA: Diagnosis present

## 2021-10-18 DIAGNOSIS — J439 Emphysema, unspecified: Secondary | ICD-10-CM | POA: Diagnosis present

## 2021-10-18 DIAGNOSIS — Z77090 Contact with and (suspected) exposure to asbestos: Secondary | ICD-10-CM | POA: Diagnosis present

## 2021-10-18 DIAGNOSIS — Z9889 Other specified postprocedural states: Secondary | ICD-10-CM | POA: Diagnosis present

## 2021-10-18 DIAGNOSIS — J9611 Chronic respiratory failure with hypoxia: Secondary | ICD-10-CM | POA: Diagnosis present

## 2021-10-18 DIAGNOSIS — Z87891 Personal history of nicotine dependence: Secondary | ICD-10-CM | POA: Diagnosis not present

## 2021-10-18 DIAGNOSIS — J690 Pneumonitis due to inhalation of food and vomit: Secondary | ICD-10-CM | POA: Diagnosis not present

## 2021-10-18 DIAGNOSIS — F0280 Dementia in other diseases classified elsewhere without behavioral disturbance: Secondary | ICD-10-CM | POA: Diagnosis present

## 2021-10-18 DIAGNOSIS — E870 Hyperosmolality and hypernatremia: Secondary | ICD-10-CM | POA: Diagnosis not present

## 2021-10-18 DIAGNOSIS — T17890A Other foreign object in other parts of respiratory tract causing asphyxiation, initial encounter: Secondary | ICD-10-CM | POA: Diagnosis present

## 2021-10-18 DIAGNOSIS — R54 Age-related physical debility: Secondary | ICD-10-CM | POA: Diagnosis present

## 2021-10-18 DIAGNOSIS — R0609 Other forms of dyspnea: Secondary | ICD-10-CM

## 2021-10-18 DIAGNOSIS — G301 Alzheimer's disease with late onset: Secondary | ICD-10-CM | POA: Diagnosis not present

## 2021-10-18 DIAGNOSIS — J61 Pneumoconiosis due to asbestos and other mineral fibers: Secondary | ICD-10-CM | POA: Diagnosis present

## 2021-10-18 DIAGNOSIS — E43 Unspecified severe protein-calorie malnutrition: Secondary | ICD-10-CM | POA: Diagnosis present

## 2021-10-18 DIAGNOSIS — J84112 Idiopathic pulmonary fibrosis: Secondary | ICD-10-CM | POA: Diagnosis present

## 2021-10-18 DIAGNOSIS — Z20822 Contact with and (suspected) exposure to covid-19: Secondary | ICD-10-CM | POA: Diagnosis present

## 2021-10-18 DIAGNOSIS — J189 Pneumonia, unspecified organism: Secondary | ICD-10-CM | POA: Diagnosis present

## 2021-10-18 DIAGNOSIS — Z91199 Patient's noncompliance with other medical treatment and regimen due to unspecified reason: Secondary | ICD-10-CM

## 2021-10-18 DIAGNOSIS — G309 Alzheimer's disease, unspecified: Secondary | ICD-10-CM | POA: Diagnosis present

## 2021-10-18 DIAGNOSIS — E860 Dehydration: Secondary | ICD-10-CM | POA: Diagnosis present

## 2021-10-18 DIAGNOSIS — J449 Chronic obstructive pulmonary disease, unspecified: Secondary | ICD-10-CM | POA: Diagnosis present

## 2021-10-18 DIAGNOSIS — Z8249 Family history of ischemic heart disease and other diseases of the circulatory system: Secondary | ICD-10-CM | POA: Diagnosis not present

## 2021-10-18 DIAGNOSIS — E440 Moderate protein-calorie malnutrition: Secondary | ICD-10-CM | POA: Diagnosis not present

## 2021-10-18 DIAGNOSIS — J181 Lobar pneumonia, unspecified organism: Principal | ICD-10-CM | POA: Diagnosis present

## 2021-10-18 DIAGNOSIS — E785 Hyperlipidemia, unspecified: Secondary | ICD-10-CM | POA: Diagnosis present

## 2021-10-18 DIAGNOSIS — J9601 Acute respiratory failure with hypoxia: Secondary | ICD-10-CM

## 2021-10-18 DIAGNOSIS — Z79899 Other long term (current) drug therapy: Secondary | ICD-10-CM | POA: Diagnosis not present

## 2021-10-18 DIAGNOSIS — G9341 Metabolic encephalopathy: Secondary | ICD-10-CM | POA: Diagnosis present

## 2021-10-18 DIAGNOSIS — Z66 Do not resuscitate: Secondary | ICD-10-CM | POA: Diagnosis present

## 2021-10-18 DIAGNOSIS — J8489 Other specified interstitial pulmonary diseases: Secondary | ICD-10-CM | POA: Diagnosis present

## 2021-10-18 DIAGNOSIS — E46 Unspecified protein-calorie malnutrition: Secondary | ICD-10-CM | POA: Diagnosis present

## 2021-10-18 DIAGNOSIS — F0390 Unspecified dementia without behavioral disturbance: Secondary | ICD-10-CM | POA: Diagnosis present

## 2021-10-18 LAB — CBC WITH DIFFERENTIAL/PLATELET
Abs Immature Granulocytes: 0.15 10*3/uL — ABNORMAL HIGH (ref 0.00–0.07)
Basophils Absolute: 0 10*3/uL (ref 0.0–0.1)
Basophils Relative: 0 %
Eosinophils Absolute: 0 10*3/uL (ref 0.0–0.5)
Eosinophils Relative: 0 %
HCT: 42.9 % (ref 39.0–52.0)
Hemoglobin: 13.9 g/dL (ref 13.0–17.0)
Immature Granulocytes: 1 %
Lymphocytes Relative: 2 %
Lymphs Abs: 0.3 10*3/uL — ABNORMAL LOW (ref 0.7–4.0)
MCH: 31.5 pg (ref 26.0–34.0)
MCHC: 32.4 g/dL (ref 30.0–36.0)
MCV: 97.3 fL (ref 80.0–100.0)
Monocytes Absolute: 0.3 10*3/uL (ref 0.1–1.0)
Monocytes Relative: 2 %
Neutro Abs: 14.9 10*3/uL — ABNORMAL HIGH (ref 1.7–7.7)
Neutrophils Relative %: 95 %
Platelets: 272 10*3/uL (ref 150–400)
RBC: 4.41 MIL/uL (ref 4.22–5.81)
RDW: 12.8 % (ref 11.5–15.5)
WBC: 15.6 10*3/uL — ABNORMAL HIGH (ref 4.0–10.5)
nRBC: 0 % (ref 0.0–0.2)

## 2021-10-18 LAB — I-STAT VENOUS BLOOD GAS, ED
Acid-Base Excess: 7 mmol/L — ABNORMAL HIGH (ref 0.0–2.0)
Bicarbonate: 34.3 mmol/L — ABNORMAL HIGH (ref 20.0–28.0)
Calcium, Ion: 1.18 mmol/L (ref 1.15–1.40)
HCT: 41 % (ref 39.0–52.0)
Hemoglobin: 13.9 g/dL (ref 13.0–17.0)
O2 Saturation: 99 %
Potassium: 4.5 mmol/L (ref 3.5–5.1)
Sodium: 144 mmol/L (ref 135–145)
TCO2: 36 mmol/L — ABNORMAL HIGH (ref 22–32)
pCO2, Ven: 59.1 mmHg (ref 44–60)
pH, Ven: 7.371 (ref 7.25–7.43)
pO2, Ven: 177 mmHg — ABNORMAL HIGH (ref 32–45)

## 2021-10-18 LAB — BASIC METABOLIC PANEL
Anion gap: 12 (ref 5–15)
BUN: 41 mg/dL — ABNORMAL HIGH (ref 8–23)
CO2: 30 mmol/L (ref 22–32)
Calcium: 9.1 mg/dL (ref 8.9–10.3)
Chloride: 104 mmol/L (ref 98–111)
Creatinine, Ser: 1.04 mg/dL (ref 0.61–1.24)
GFR, Estimated: >60 mL/min (ref >60)
Glucose, Bld: 147 mg/dL — ABNORMAL HIGH (ref 70–99)
Potassium: 4.7 mmol/L (ref 3.5–5.1)
Sodium: 146 mmol/L — ABNORMAL HIGH (ref 135–145)

## 2021-10-18 MED ORDER — ATORVASTATIN CALCIUM 40 MG PO TABS
40.0000 mg | ORAL_TABLET | Freq: Every day | ORAL | Status: DC
  Administered 2021-10-19 – 2021-10-22 (×4): 40 mg via ORAL
  Filled 2021-10-18 (×4): qty 1

## 2021-10-18 MED ORDER — UMECLIDINIUM BROMIDE 62.5 MCG/ACT IN AEPB
1.0000 | INHALATION_SPRAY | Freq: Every day | RESPIRATORY_TRACT | Status: DC
  Administered 2021-10-21 – 2021-10-22 (×2): 1 via RESPIRATORY_TRACT
  Filled 2021-10-18: qty 7

## 2021-10-18 MED ORDER — ARFORMOTEROL TARTRATE 15 MCG/2ML IN NEBU
15.0000 ug | INHALATION_SOLUTION | Freq: Two times a day (BID) | RESPIRATORY_TRACT | Status: DC
  Administered 2021-10-19 – 2021-10-22 (×7): 15 ug via RESPIRATORY_TRACT
  Filled 2021-10-18 (×7): qty 2

## 2021-10-18 MED ORDER — SODIUM CHLORIDE 0.9 % IV SOLN
500.0000 mg | INTRAVENOUS | Status: DC
  Administered 2021-10-18 – 2021-10-19 (×2): 500 mg via INTRAVENOUS
  Filled 2021-10-18 (×3): qty 5

## 2021-10-18 MED ORDER — SODIUM CHLORIDE 0.9 % IV SOLN
2.0000 g | INTRAVENOUS | Status: DC
  Administered 2021-10-18 – 2021-10-21 (×4): 2 g via INTRAVENOUS
  Filled 2021-10-18 (×4): qty 20

## 2021-10-18 MED ORDER — ALBUTEROL SULFATE (2.5 MG/3ML) 0.083% IN NEBU
2.5000 mg | INHALATION_SOLUTION | RESPIRATORY_TRACT | Status: DC | PRN
Start: 2021-10-18 — End: 2021-10-22

## 2021-10-18 MED ORDER — ONDANSETRON HCL 4 MG/2ML IJ SOLN: 4.0000 mg | Freq: Four times a day (QID) | INTRAMUSCULAR | Status: DC | PRN

## 2021-10-18 MED ORDER — METRONIDAZOLE 500 MG/100ML IV SOLN
500.0000 mg | Freq: Two times a day (BID) | INTRAVENOUS | Status: DC
  Administered 2021-10-18 – 2021-10-20 (×4): 500 mg via INTRAVENOUS
  Filled 2021-10-18 (×4): qty 100

## 2021-10-18 MED ORDER — GUAIFENESIN ER 600 MG PO TB12
600.0000 mg | ORAL_TABLET | Freq: Two times a day (BID) | ORAL | Status: DC
Start: 2021-10-19 — End: 2021-10-19

## 2021-10-18 MED ORDER — ONDANSETRON HCL 4 MG PO TABS: 4.0000 mg | ORAL_TABLET | Freq: Four times a day (QID) | ORAL | Status: DC | PRN

## 2021-10-18 MED ORDER — ENOXAPARIN SODIUM 40 MG/0.4ML IJ SOSY
40.0000 mg | PREFILLED_SYRINGE | INTRAMUSCULAR | Status: DC
  Administered 2021-10-19 – 2021-10-22 (×4): 40 mg via SUBCUTANEOUS
  Filled 2021-10-18 (×4): qty 0.4

## 2021-10-18 MED ORDER — IPRATROPIUM-ALBUTEROL 0.5-2.5 (3) MG/3ML IN SOLN
3.0000 mL | Freq: Once | RESPIRATORY_TRACT | Status: AC
  Administered 2021-10-18: 3 mL via RESPIRATORY_TRACT
  Filled 2021-10-18: qty 3

## 2021-10-18 MED ORDER — SODIUM CHLORIDE 0.9 % IV SOLN: INTRAVENOUS | Status: AC

## 2021-10-18 MED ORDER — SODIUM CHLORIDE 0.9 % IV BOLUS
1000.0000 mL | Freq: Once | INTRAVENOUS | Status: AC
  Administered 2021-10-18: 1000 mL via INTRAVENOUS

## 2021-10-18 MED ORDER — SERTRALINE HCL 50 MG PO TABS
50.0000 mg | ORAL_TABLET | Freq: Every day | ORAL | Status: DC
  Administered 2021-10-19 – 2021-10-22 (×4): 50 mg via ORAL
  Filled 2021-10-18 (×4): qty 1

## 2021-10-18 MED ORDER — DIVALPROEX SODIUM 125 MG PO CSDR
125.0000 mg | DELAYED_RELEASE_CAPSULE | Freq: Two times a day (BID) | ORAL | Status: DC
  Administered 2021-10-19 – 2021-10-22 (×7): 125 mg via ORAL
  Filled 2021-10-18 (×9): qty 1

## 2021-10-18 NOTE — Telephone Encounter (Signed)
[name] from [facility] is calling to ensure that patient has a follow up appointment with Jonathan Harmon in order to be re certified for oxygen. She stated they have been trying to call him but had no luck and they want a note to be added stating pt is on a portable and stationary oxygent tank. Please advise.

## 2021-10-19 DIAGNOSIS — G301 Alzheimer's disease with late onset: Secondary | ICD-10-CM | POA: Diagnosis not present

## 2021-10-19 DIAGNOSIS — J449 Chronic obstructive pulmonary disease, unspecified: Secondary | ICD-10-CM | POA: Diagnosis not present

## 2021-10-19 DIAGNOSIS — J189 Pneumonia, unspecified organism: Secondary | ICD-10-CM | POA: Diagnosis not present

## 2021-10-19 DIAGNOSIS — J9611 Chronic respiratory failure with hypoxia: Secondary | ICD-10-CM | POA: Diagnosis not present

## 2021-10-19 DIAGNOSIS — E43 Unspecified severe protein-calorie malnutrition: Secondary | ICD-10-CM | POA: Diagnosis present

## 2021-10-19 LAB — BASIC METABOLIC PANEL
Anion gap: 8 (ref 5–15)
BUN: 36 mg/dL — ABNORMAL HIGH (ref 8–23)
CO2: 28 mmol/L (ref 22–32)
Calcium: 8.1 mg/dL — ABNORMAL LOW (ref 8.9–10.3)
Chloride: 107 mmol/L (ref 98–111)
Creatinine, Ser: 0.92 mg/dL (ref 0.61–1.24)
GFR, Estimated: >60 mL/min (ref >60)
Glucose, Bld: 162 mg/dL — ABNORMAL HIGH (ref 70–99)
Potassium: 4 mmol/L (ref 3.5–5.1)
Sodium: 143 mmol/L (ref 135–145)

## 2021-10-19 LAB — CBC
HCT: 36.1 % — ABNORMAL LOW (ref 39.0–52.0)
Hemoglobin: 11.4 g/dL — ABNORMAL LOW (ref 13.0–17.0)
MCH: 31.1 pg (ref 26.0–34.0)
MCHC: 31.6 g/dL (ref 30.0–36.0)
MCV: 98.4 fL (ref 80.0–100.0)
Platelets: 236 10*3/uL (ref 150–400)
RBC: 3.67 MIL/uL — ABNORMAL LOW (ref 4.22–5.81)
RDW: 13 % (ref 11.5–15.5)
WBC: 10.3 10*3/uL (ref 4.0–10.5)
nRBC: 0 % (ref 0.0–0.2)

## 2021-10-19 MED ORDER — LORATADINE 10 MG PO TABS
10.0000 mg | ORAL_TABLET | Freq: Every day | ORAL | Status: DC
  Administered 2021-10-19 – 2021-10-20 (×2): 10 mg via ORAL
  Filled 2021-10-19 (×2): qty 1

## 2021-10-19 MED ORDER — ENSURE ENLIVE PO LIQD
237.0000 mL | Freq: Three times a day (TID) | ORAL | Status: DC
  Administered 2021-10-19 – 2021-10-22 (×8): 237 mL via ORAL

## 2021-10-19 MED ORDER — PANTOPRAZOLE SODIUM 40 MG PO TBEC
40.0000 mg | DELAYED_RELEASE_TABLET | Freq: Every day | ORAL | Status: DC
  Administered 2021-10-19 – 2021-10-22 (×4): 40 mg via ORAL
  Filled 2021-10-19 (×4): qty 1

## 2021-10-19 MED ORDER — GUAIFENESIN ER 600 MG PO TB12
1200.0000 mg | ORAL_TABLET | Freq: Two times a day (BID) | ORAL | Status: DC
  Administered 2021-10-19 – 2021-10-22 (×7): 1200 mg via ORAL
  Filled 2021-10-19 (×7): qty 2

## 2021-10-19 MED ORDER — FLUTICASONE PROPIONATE 50 MCG/ACT NA SUSP
2.0000 | Freq: Every day | NASAL | Status: DC
  Administered 2021-10-19 – 2021-10-22 (×4): 2 via NASAL
  Filled 2021-10-19 (×3): qty 16

## 2021-10-19 NOTE — Progress Notes (Addendum)
PROGRESS NOTE    Jonathan Harmon  ZOX:[id number] DOB: September 28, 1933 DOA: 10/18/2021 PCP: Esperanza Richters, PA-C    Chief Complaint  Patient presents with   Shortness of Breath    Brief Narrative:  Patient 88 year old gentleman history of dementia, COPD, asbestosis, chronic hypoxic respiratory failure presenting from memory care unit due to shortness of breath and confusion.  Per admitting physician EMS found patient with 100 feet of oxygen tubing and hooked it up to the tank with improvement with mental status.  Patient noted to have had an episode where he was noted to be gurgling on his secretions, hypoxic and brought to the ED.  Patient noted to have some nonadherence with supplemental O2 until ED visit 10/13/2021.  Was noted that family felt patient with poor oral intake and declining.  Patient seen in the ED, CT chest done with severe emphysema with new airspace disease in the inferior posterior aspect of the bilateral lower lobes.  Patient admitted, hydrated with IV fluids, placed on empiric IV antibiotics.   Assessment & Plan:   Principal Problem:   Pneumonia Active Problems:   COPD (chronic obstructive pulmonary disease) (HCC)   Protein calorie malnutrition (HCC)   Chronic respiratory failure with hypoxia (HCC)   Dementia (HCC)   Protein-calorie malnutrition, severe  #1 pneumonia, -Concern for aspiration pneumonia. -Patient had presented with increased shortness of breath, confusion, mental status improved and back to baseline prior to admission. -Patient noted with a leukocytosis, CT chest concerning for airspace disease in bilateral inferior posterior lower lobes. -It is noted per admitting physician that patient had an episode 6/21 where he had been having difficulty managing secretions and aspiration pneumonia suspected. -Sputum Gram stain and cultures pending. -Urine strep pneumococcus antigen pending, urine Legionella antigen pending. -Patient seen by speech therapy and placed  on a regular diet. -Place on Flonase, PPI, Claritin.  Increase Mucinex to 1200 twice daily. -Continue empiric IV antibiotics of Rocephin, azithromycin, Flagyl. -Supportive care.  2.  Chronic hypoxic respiratory failure/COPD -Stable. -It is noted per admitting physician patient with some noncompliance with supplemental O2 until 10/13/2021. -Continue LAMA-LABA, as needed SABA, O2.  3.  Dementia -Patient noted to be noncompliant with Aricept. -Continue Depakote sprinkles, Zoloft.  4.  Severe protein calorie malnutrition -Nutritional supplementation.   DVT prophylaxis: Lovenox Code Status: Full Family Communication: Updated patient.  No family at bedside. Disposition: Back to memory care unit when clinically improved.  Status is: Inpatient Remains inpatient appropriate because: Severity of illness   Consultants:  None  Procedures: CT chest 10/18/2021 Chest x-ray 10/18/2021    Antimicrobials:  IV azithromycin 10/18/2021>>>> IV Rocephin 10/18/2021>>>>>> IV Flagyl 10/18/2021>>>>>>   Subjective: Laying in bed.  Complains of feeling tired/fatigued.  No chest pain.  No significant shortness of breath.  Denies any productive cough.  No abdominal pain.  Objective: Vitals:   10/19/21 0740 10/19/21 0841 10/19/21 0918 10/19/21 1546  BP:  (!) 98/52  (!) 101/49  Pulse:  (!) 51  (!) 51  Resp:  18  18  Temp:  97.7 F (36.5 C)  97.8 F (36.6 C)  TempSrc:  Oral  Oral  SpO2: 99% 99% 98% 98%  Height:        Intake/Output Summary (Last 24 hours) at 10/19/2021 1854 Last data filed at 10/19/2021 8119 Gross per 24 hour  Intake 1940.52 ml  Output --  Net 1940.52 ml   There were no vitals filed for this visit.  Examination:  General exam: Appears calm and comfortable.  Dry mucous membranes. Respiratory system: Coarse rhonchorous breath sounds anterior lung fields.  No wheezing.  Fair air movement.  Speaking in full sentences.   Cardiovascular system: S1 & S2 heard, RRR. No JVD,  murmurs, rubs, gallops or clicks. No pedal edema. Gastrointestinal system: Abdomen is nondistended, soft and nontender. No organomegaly or masses felt. Normal bowel sounds heard. Central nervous system: Alert and oriented. No focal neurological deficits. Extremities: Symmetric 5 x 5 power. Skin: No rashes, lesions or ulcers Psychiatry: Judgement and insight appear normal. Mood & affect appropriate.     Data Reviewed: I have personally reviewed following labs and imaging studies  CBC: Recent Labs  Lab 10/13/21 1211 10/13/21 1218 10/18/21 1513 10/18/21 1626 10/19/21 0059  WBC 12.3*  --  15.6*  --  10.3  NEUTROABS 10.3*  --  14.9*  --   --   HGB 14.0 14.3 13.9 13.9 11.4*  HCT 43.1 42.0 42.9 41.0 36.1*  MCV 97.3  --  97.3  --  98.4  PLT 249  --  272  --  236    Basic Metabolic Panel: Recent Labs  Lab 10/13/21 1211 10/13/21 1218 10/18/21 1513 10/18/21 1626 10/19/21 0059  NA 139 138 146* 144 143  K 4.4 4.4 4.7 4.5 4.0  CL 97*  --  104  --  107  CO2 28  --  30  --  28  GLUCOSE 123*  --  147*  --  162*  BUN 20  --  41*  --  36*  CREATININE 1.16  --  1.04  --  0.92  CALCIUM 9.0  --  9.1  --  8.1*    GFR: Estimated Creatinine Clearance: 42.3 mL/min (by C-G formula based on SCr of 0.92 mg/dL).  Liver Function Tests: No results for input(s): "AST", "ALT", "ALKPHOS", "BILITOT", "PROT", "ALBUMIN" in the last 168 hours.  CBG: No results for input(s): "GLUCAP" in the last 168 hours.   Recent Results (from the past 240 hour(s))  SARS Coronavirus 2 by RT PCR (hospital order, performed in [facility] hospital lab) *cepheid single result test* Anterior Nasal Swab     Status: None   Collection Time: 10/13/21 12:14 PM   Specimen: Anterior Nasal Swab  Result Value Ref Range Status   SARS Coronavirus 2 by RT PCR NEGATIVE NEGATIVE Final    Comment: (NOTE) SARS-CoV-2 target nucleic acids are NOT DETECTED.  The SARS-CoV-2 RNA is generally detectable in upper and  lower respiratory specimens during the acute phase of infection. The lowest concentration of SARS-CoV-2 viral copies this assay can detect is 250 copies / mL. A negative result does not preclude SARS-CoV-2 infection and should not be used as the sole basis for treatment or other patient management decisions.  A negative result may occur with improper specimen collection / handling, submission of specimen other than nasopharyngeal swab, presence of viral mutation(s) within the areas targeted by this assay, and inadequate number of viral copies (<250 copies / mL). A negative result must be combined with clinical observations, patient history, and epidemiological information.  Fact Sheet for Patients:   RoadLapTop.co.za  Fact Sheet for Healthcare Providers: http://kim-miller.com/  This test is not yet approved or  cleared by the [place] FDA and has been authorized for detection and/or diagnosis of SARS-CoV-2 by FDA under an Emergency Use Authorization (EUA).  This EUA will remain in effect (meaning this test can be used) for the duration of the COVID-19 declaration under Section 564(b)(1) of the Act,  21 U.S.C. section 360bbb-3(b)(1), unless the authorization is terminated or revoked sooner.  Performed at [facility], [address], [place], [place] [address]          Radiology Studies: CT Chest Wo Contrast  Result Date: 10/18/2021 CLINICAL DATA:  Assess for pneumonia.  Shortness of breath. EXAM: CT CHEST WITHOUT CONTRAST TECHNIQUE: Multidetector CT imaging of the chest was performed following the standard protocol without IV contrast. RADIATION DOSE REDUCTION: This exam was performed according to the departmental dose-optimization program which includes automated exposure control, adjustment of the mA and/or kV according to patient size and/or use of iterative reconstruction technique. COMPARISON:  Chest x-ray same day.  CT  chest 06/01/2020. FINDINGS: Cardiovascular: Aorta is ectatic without focal dilatation. Heart is normal in size. There is no pericardial effusion. There are atherosclerotic calcifications of the aorta and coronary arteries. Mediastinum/Nodes: No enlarged mediastinal or axillary lymph nodes. Thyroid gland, trachea, and esophagus demonstrate no significant findings. Lungs/Pleura: Severe emphysematous changes are again seen. There are extensive bilateral calcified pleural plaques, unchanged from prior. There is new airspace disease within the inferior posterior bilateral lower lobes with associated peribronchial wall thickening and mucous plugging, new from prior. Areas of peripheral scarring in the right upper lobe appear unchanged from prior. No pleural effusion or pneumothorax identified. Upper Abdomen: No acute abnormality. Musculoskeletal: No chest wall mass or suspicious bone lesions identified. IMPRESSION: 1. New small amount of bilateral lower lobe airspace disease with peribronchial wall thickening and mucous plugging compatible with infection. 2. Stable extensive calcified pleural plaques compatible with prior asbestos exposure. Stable scarring and chronic changes in the right upper lobe. 3. Aortic Atherosclerosis (ICD10-I70.0) and Emphysema (ICD10-J43.9). Electronically Signed   By: Darliss Cheney M.D.   On: 10/18/2021 20:22   DG Chest Port 1 View  Result Date: 10/18/2021 CLINICAL DATA:  Provided history: Dyspnea. EXAM: PORTABLE CHEST 1 VIEW COMPARISON:  Prior chest radiographs 10/13/2021 and earlier. FINDINGS: Heart size within normal limits. As before, there are extensive calcified pleural plaques bilaterally. No appreciable superimposed acute airspace consolidation. No evidence of pulmonary edema. No pleural effusion or pneumothorax. No acute bony abnormality identified. Thoracic levocurvature. Degenerative changes of the spine. IMPRESSION: No evidence of acute cardiopulmonary abnormality. Unchanged  extensive calcified pleural plaques, bilaterally. Electronically Signed   By: Jackey Loge D.O.   On: 10/18/2021 15:31        Scheduled Meds:  arformoterol  15 mcg Nebulization BID   And   umeclidinium bromide  1 puff Inhalation Daily   atorvastatin  40 mg Oral Daily   divalproex  125 mg Oral BID   enoxaparin (LOVENOX) injection  40 mg Subcutaneous Q24H   feeding supplement  237 mL Oral TID BM   fluticasone  2 spray Each Nare Daily   guaiFENesin  1,200 mg Oral BID   loratadine  10 mg Oral Daily   pantoprazole  40 mg Oral Q0600   sertraline  50 mg Oral Daily   Continuous Infusions:  azithromycin 250 mL/hr at 10/19/21 0619   cefTRIAXone (ROCEPHIN)  IV Stopped (10/18/21 2229)   metronidazole 500 mg (10/19/21 0959)     LOS: 1 day    Time spent: 35 minutes    Ramiro Harvest, MD [facility]   To contact the attending provider between 7A-7P or the covering provider during after hours 7P-7A, please log into the web site [email or web address] and access using universal [place] password for that web site. If you do not have the password,  please call the hospital operator.  10/19/2021, 6:54 PM

## 2021-10-20 DIAGNOSIS — E870 Hyperosmolality and hypernatremia: Secondary | ICD-10-CM | POA: Diagnosis present

## 2021-10-20 DIAGNOSIS — J189 Pneumonia, unspecified organism: Secondary | ICD-10-CM | POA: Diagnosis not present

## 2021-10-20 DIAGNOSIS — J84112 Idiopathic pulmonary fibrosis: Secondary | ICD-10-CM | POA: Diagnosis present

## 2021-10-20 DIAGNOSIS — G9341 Metabolic encephalopathy: Secondary | ICD-10-CM | POA: Diagnosis present

## 2021-10-20 DIAGNOSIS — Z9889 Other specified postprocedural states: Secondary | ICD-10-CM | POA: Diagnosis present

## 2021-10-20 LAB — CBC
HCT: 37 % — ABNORMAL LOW (ref 39.0–52.0)
Hemoglobin: 12 g/dL — ABNORMAL LOW (ref 13.0–17.0)
MCH: 32.3 pg (ref 26.0–34.0)
MCHC: 32.4 g/dL (ref 30.0–36.0)
MCV: 99.7 fL (ref 80.0–100.0)
Platelets: 229 10*3/uL (ref 150–400)
RBC: 3.71 MIL/uL — ABNORMAL LOW (ref 4.22–5.81)
RDW: 12.8 % (ref 11.5–15.5)
WBC: 13.7 10*3/uL — ABNORMAL HIGH (ref 4.0–10.5)
nRBC: 0 % (ref 0.0–0.2)

## 2021-10-20 LAB — BASIC METABOLIC PANEL
Anion gap: 5 (ref 5–15)
BUN: 23 mg/dL (ref 8–23)
CO2: 30 mmol/L (ref 22–32)
Calcium: 8.1 mg/dL — ABNORMAL LOW (ref 8.9–10.3)
Chloride: 109 mmol/L (ref 98–111)
Creatinine, Ser: 0.84 mg/dL (ref 0.61–1.24)
GFR, Estimated: >60 mL/min (ref >60)
Glucose, Bld: 91 mg/dL (ref 70–99)
Potassium: 4.3 mmol/L (ref 3.5–5.1)
Sodium: 144 mmol/L (ref 135–145)

## 2021-10-20 MED ORDER — AZITHROMYCIN 250 MG PO TABS
500.0000 mg | ORAL_TABLET | Freq: Every day | ORAL | Status: DC
  Administered 2021-10-20 – 2021-10-21 (×2): 500 mg via ORAL
  Filled 2021-10-20 (×2): qty 2

## 2021-10-20 NOTE — Telephone Encounter (Signed)
Beryl Meager , she stated she just wanted to make sure patient had a hospital follow up so they could re-certify his in home oxygen , also made her aware pt sees pulmonology and she stated she would reach out to that office as well

## 2021-10-20 NOTE — Progress Notes (Signed)
  Progress Note Patient: Jonathan Harmon WUG:[id number] DOB: 28-Jan-1934 DOA: 10/18/2021  DOS: the patient was seen and examined on 10/20/2021  Brief hospital course: Past medical history of dementia, COPD, chronic respiratory failure, dementia at memory care unit presents to the hospital with complaints of confusion and shortness of breath found to have acute pneumonia. Assessment and Plan:   Community acquired pneumonia, bilateral with mucus plugging   COPD (chronic obstructive pulmonary disease) (HCC)   Chronic respiratory failure with hypoxia (HCC)   UIP (usual interstitial pneumonitis) (HCC) For now we will continue with IV antibiotics. Continue Mucinex. If does not improve with added steroids. Continue as needed nebulizer therapy. Continue oxygen.  Appears to be at its baseline. Does not appear to have any exacerbation for now.    Dementia (HCC)   Acute metabolic encephalopathy Mentation progressively worsening for last few days. Likely due to hypoxia. Possibility of progression of dementia cannot be ruled out. For now we will monitor. Oral intake still minimal.    H/O laceration repair 6/21 on face Sutures are absorbable. Monitor.    Protein-calorie malnutrition, severe Placing the patient at high risk of poor outcome. Underweight with BMI of 17. Appears to be a chronic issue for this patient.    Hypernatremia From poor p.o. intake.  Goals of care discussion. Discussed with son.  Patient is DNR. Updated  Subjective: Mentation improving but not back to baseline.  Oral intake still minimal.  No nausea or vomiting.  Still has cough and shortness of breath.  Physical Exam: Vitals:   10/20/21 0537 10/20/21 0843 10/20/21 1004 10/20/21 1400  BP: 120/61 121/66    Pulse: (!) 56 (!) 57    Resp: 18 16    Temp: 98.1 F (36.7 C) (!) 97.5 F (36.4 C)    TempSrc: Oral Oral    SpO2: 98% 97% 97% 98%  Height:       General: Appear in moderate distress; no visible Abnormal Neck  Mass Or lumps, Conjunctiva normal Cardiovascular: S1 and S2 Present, no Murmur, Respiratory: increased respiratory effort, Bilateral Air entry present and bilateral  Crackles, no wheezes Abdomen: Bowel Sound present, Non tender Extremities: no Pedal edema Neurology: alert and oriented to place and person  Gait not checked due to patient safety concerns   Data Reviewed: I have Reviewed nursing notes, Vitals, and Lab results since pt's last encounter. Pertinent lab results CBC and BMP I have ordered test including CBC and BMP    Family Communication: Discussed with son on the phone  Disposition: Status is: Inpatient Remains inpatient appropriate because: Still on IV antibiotics.  Still on oxygen.  Minimal oral intake.  Monitor for improvement.  Author: Lynden Oxford, MD 10/20/2021 7:35 PM  Please look on [email or web address] to find out who is on call.

## 2021-10-20 NOTE — TOC Initial Note (Addendum)
Transition of Care [facility]) - Initial/Assessment Note    Patient Details  Name: Jonathan Harmon MRN: [id number] Date of Birth: [birth year]  [facility]) CM/SW Contact:    Lynett Grimes Phone Number: 10/20/2021, 3:44 PM  Clinical Narrative:                 CSW spoke with a representative at [facility], no one was available at the memory care unit. Representative took CSW info. to pass along to memory care.  CSW attempted to contact pt son, no answer. CSW will continue to follow up with family for DC planning needs.  Pt son contacted CSW back and SW informed him of pt possible dc tomorrow. Pt son would like to speak with MD before pt DC.   Expected Discharge Plan: Memory Care Barriers to Discharge: Continued Medical Work up   Patient Goals and CMS Choice Patient states their goals for this hospitalization and ongoing recovery are:: Return to [facility] for Memory care      Expected Discharge Plan and Services Expected Discharge Plan: Memory Care In-house Referral: Clinical Social Work   Post Acute Care Choice: Long Term Acute Care (LTAC) (Memory Care)                                        Prior Living Arrangements/Services   Lives with:: Facility Resident Patient language and need for interpreter reviewed:: Yes Do you feel safe going back to the place where you live?: Yes      Need for Family Participation in Patient Care: Yes (Comment) Care giver support system in place?: Yes (comment)   Criminal Activity/Legal Involvement Pertinent to Current Situation/Hospitalization: No - Comment as needed  Activities of Daily Living      Permission Sought/Granted Permission sought to share information with : Facility Medical sales representative, Family Supports Permission granted to share information with : Yes, Verbal Permission Granted  Share Information with NAME: Meng,[name] (Son)  Permission granted to share info w AGENCY: [facility]  Permission  granted to share info w Relationship: Son  Permission granted to share info w Contact Information: [phone]  Emotional Assessment Appearance:: Appears stated age Attitude/Demeanor/Rapport: Unable to Assess Affect (typically observed): Unable to Assess Orientation: : Oriented to Self Alcohol / Substance Use: Not Applicable Psych Involvement: No (comment)  Admission diagnosis:  Pneumonia [J18.9] Acute respiratory failure with hypoxia (HCC) [J96.01] Other form of dyspnea [R06.09] Aspiration pneumonia of both lower lobes, unspecified aspiration pneumonia type (HCC) [J69.0] Chronic obstructive pulmonary disease, unspecified COPD type (HCC) [J44.9] Patient Active Problem List   Diagnosis Date Noted   Hypernatremia 10/20/2021   Acute metabolic encephalopathy 10/20/2021   H/O laceration repair 6/21 on face 10/20/2021   UIP (usual interstitial pneumonitis) (HCC) 10/20/2021   Protein-calorie malnutrition, severe 10/19/2021   Community acquired pneumonia, bilateral with mucus plugging 10/18/2021   Asbestosis (HCC) 08/11/2020   Chronic respiratory failure with hypoxia (HCC) 08/11/2020   HTN (hypertension) 08/11/2020   Arthralgia of both hands 10/29/2019   Allergic rhinitis 08/31/2018   Dementia (HCC) 01/07/2018   Exertional dyspnea 07/28/2016   COPD (chronic obstructive pulmonary disease) (HCC) 07/28/2016   Calcified pleural plaque due to asbestos exposure 07/28/2016   PCP:  Esperanza Richters, PA-C Pharmacy:   [facility] [address], [address] [place] [address] Phone: [phone] Fax: [phone]  [facility] [phone] -  [place], [place] - [address] AT [facility] [address] [place] [address] Phone: [phone] Fax: [phone]  [facility] [address] [place] [address] Phone: [phone] Fax: [phone]     Social Determinants of Health (SDOH)  Interventions    Readmission Risk Interventions     No data to display

## 2021-10-20 NOTE — [place] FL2 (Addendum)
[place] MEDICAID FL2 LEVEL OF CARE SCREENING TOOL     IDENTIFICATION  Patient Name: Jonathan Harmon Birthdate: 06-22-1933 Sex: male Admission Date (Current Location): 10/18/2021  [facility] and [place] Number:  Producer, television/film/video and Address:  [facility], [address] [place], [place] [address]      Provider Number: [id number]  Attending Physician Name and Address:  Rolly Salter, MD  Relative Name and Phone Number:  Joban, [place])   [phone]    Current Level of Care: Hospital Recommended Level of Care: Memory Care Prior Approval Number:    Date Approved/Denied:   PASRR Number: [id number]  Discharge Plan: Other (Comment)    Current Diagnoses: Patient Active Problem List   Diagnosis Date Noted   Hypernatremia 10/20/2021   Acute metabolic encephalopathy 10/20/2021   H/O laceration repair 6/21 on face 10/20/2021   UIP (usual interstitial pneumonitis) (HCC) 10/20/2021   Protein-calorie malnutrition, severe 10/19/2021   Community acquired pneumonia, bilateral with mucus plugging 10/18/2021   Asbestosis (HCC) 08/11/2020   Chronic respiratory failure with hypoxia (HCC) 08/11/2020   HTN (hypertension) 08/11/2020   Arthralgia of both hands 10/29/2019   Allergic rhinitis 08/31/2018   Dementia (HCC) 01/07/2018   Exertional dyspnea 07/28/2016   COPD (chronic obstructive pulmonary disease) (HCC) 07/28/2016   Calcified pleural plaque due to asbestos exposure 07/28/2016    Orientation RESPIRATION BLADDER Height & Weight     Self  O2 Incontinent, External catheter Weight:   Height:  5\' 8"  (172.7 cm)  BEHAVIORAL SYMPTOMS/MOOD NEUROLOGICAL BOWEL NUTRITION STATUS      Continent Diet (See DC Summary)  AMBULATORY STATUS COMMUNICATION OF NEEDS Skin   Independent Verbally Normal                       Personal Care Assistance Level of Assistance  Bathing, Feeding, Dressing Bathing Assistance: Independent Feeding assistance:  Independent Dressing Assistance: Independent     Functional Limitations Info  Sight, Hearing, Speech Sight Info: Adequate Hearing Info: Adequate Speech Info: Adequate    SPECIAL CARE FACTORS FREQUENCY                       Contractures Contractures Info: Not present    Additional Factors Info  Allergies, Code Status Code Status Info: Full Allergies Info: Penicillin G, Penicillins           Current Medications (10/20/2021):  This is the current hospital active medication list Current Facility-Administered Medications  Medication Dose Route Frequency Provider Last Rate Last Admin   albuterol (PROVENTIL) (2.5 MG/3ML) 0.083% nebulizer solution 2.5 mg  2.5 mg Inhalation Q4H PRN Opyd, 10/22/2021, MD       arformoterol (BROVANA) nebulizer solution 15 mcg  15 mcg Nebulization BID Opyd, Lavone Neri, MD   15 mcg at 10/20/21 1002   And   umeclidinium bromide (INCRUSE ELLIPTA) 62.5 MCG/ACT 1 puff  1 puff Inhalation Daily Opyd, 10/22/21, MD       atorvastatin (LIPITOR) tablet 40 mg  40 mg Oral Daily Opyd, Lavone Neri, MD   40 mg at 10/20/21 1030   azithromycin (ZITHROMAX) tablet 500 mg  500 mg Oral Daily 10/22/21, RPH       cefTRIAXone (ROCEPHIN) 2 g in sodium chloride 0.9 % 100 mL IVPB  2 g Intravenous Q24H Opyd, Vicente Serene, MD 200 mL/hr at 10/19/21 2056 2 g at 10/19/21 2056   divalproex (DEPAKOTE SPRINKLE) capsule 125 mg  125 mg Oral BID Opyd, Lavone Neri, MD   125 mg at 10/20/21 1030   enoxaparin (LOVENOX) injection 40 mg  40 mg Subcutaneous Q24H Opyd, Lavone Neri, MD   40 mg at 10/20/21 1039   feeding supplement (ENSURE ENLIVE / ENSURE PLUS) liquid 237 mL  237 mL Oral TID BM Rodolph Bong, MD   237 mL at 10/20/21 1423   fluticasone (FLONASE) 50 MCG/ACT nasal spray 2 spray  2 spray Each Nare Daily Rodolph Bong, MD   2 spray at 10/20/21 1039   guaiFENesin (MUCINEX) 12 hr tablet 1,200 mg  1,200 mg Oral BID Rodolph Bong, MD   1,200 mg at 10/20/21 1031   loratadine  (CLARITIN) tablet 10 mg  10 mg Oral Daily Rodolph Bong, MD   10 mg at 10/20/21 1030   metroNIDAZOLE (FLAGYL) IVPB 500 mg  500 mg Intravenous Q12H Opyd, Lavone Neri, MD 100 mL/hr at 10/20/21 1030 500 mg at 10/20/21 1030   ondansetron (ZOFRAN) tablet 4 mg  4 mg Oral Q6H PRN Opyd, Lavone Neri, MD       Or   ondansetron (ZOFRAN) injection 4 mg  4 mg Intravenous Q6H PRN Opyd, Lavone Neri, MD       pantoprazole (PROTONIX) EC tablet 40 mg  40 mg Oral Q0600 Rodolph Bong, MD   40 mg at 10/20/21 3846   sertraline (ZOLOFT) tablet 50 mg  50 mg Oral Daily Opyd, Lavone Neri, MD   50 mg at 10/20/21 1030     Discharge Medications: Please see discharge summary for a list of discharge medications.  Relevant Imaging Results:  Relevant Lab Results:   Additional Information [id number]  Ivette Loyal, [place]

## 2021-10-20 NOTE — Hospital Course (Signed)
Past medical history of dementia, COPD, chronic respiratory failure, dementia at memory care unit presents to the hospital with complaints of confusion and shortness of breath found to have acute pneumonia.

## 2021-10-21 ENCOUNTER — Inpatient Hospital Stay (HOSPITAL_COMMUNITY): Payer: Medicare Other

## 2021-10-21 DIAGNOSIS — J189 Pneumonia, unspecified organism: Secondary | ICD-10-CM | POA: Diagnosis not present

## 2021-10-21 LAB — CBC
HCT: 38.8 % — ABNORMAL LOW (ref 39.0–52.0)
Hemoglobin: 12.3 g/dL — ABNORMAL LOW (ref 13.0–17.0)
MCH: 31.1 pg (ref 26.0–34.0)
MCHC: 31.7 g/dL (ref 30.0–36.0)
MCV: 98.2 fL (ref 80.0–100.0)
Platelets: 273 10*3/uL (ref 150–400)
RBC: 3.95 MIL/uL — ABNORMAL LOW (ref 4.22–5.81)
RDW: 12.6 % (ref 11.5–15.5)
WBC: 16.8 10*3/uL — ABNORMAL HIGH (ref 4.0–10.5)
nRBC: 0 % (ref 0.0–0.2)

## 2021-10-21 LAB — BASIC METABOLIC PANEL
Anion gap: 6 (ref 5–15)
BUN: 22 mg/dL (ref 8–23)
CO2: 30 mmol/L (ref 22–32)
Calcium: 8.2 mg/dL — ABNORMAL LOW (ref 8.9–10.3)
Chloride: 106 mmol/L (ref 98–111)
Creatinine, Ser: 0.76 mg/dL (ref 0.61–1.24)
GFR, Estimated: >60 mL/min (ref >60)
Glucose, Bld: 96 mg/dL (ref 70–99)
Potassium: 4 mmol/L (ref 3.5–5.1)
Sodium: 142 mmol/L (ref 135–145)

## 2021-10-21 LAB — PROCALCITONIN: Procalcitonin: <0.1 ng/mL

## 2021-10-21 NOTE — Evaluation (Signed)
Physical Therapy Evaluation Patient Details Name: Jonathan Harmon MRN: [id number] DOB: [birth year] Today's Date: 10/21/2021  History of Present Illness  88 y/o male presented to ED on 10/18/21 for AMS and SOB. Found to have acute pneumonia. PMH: dementia, COPD, asbestosis, HTN  Clinical Impression  Patient admitted with the above. Information obtained from chart review due to hx of dementia. Patient living in memory care at a facility but able to walk without AD. Patient currently presents with weakness, impaired balance, decreased activity tolerance, and impaired cognition. Patient requiring up to modA+1 for ambulation with HHAx1 due to balance deficits. Ambulated on 3L O2 with drop to 84%, increased to 4L O2 with slow increase to 91%. Patient will benefit from skilled PT services during acute stay to address listed deficits. Recommend SNF at discharge to maximize functional mobility prior to returning to memory care unit.      Recommendations for follow up therapy are one component of a multi-disciplinary discharge planning process, led by the attending physician.  Recommendations may be updated based on patient status, additional functional criteria and insurance authorization.  Follow Up Recommendations Skilled nursing-short term rehab (<3 hours/day) Can patient physically be transported by private vehicle: Yes    Assistance Recommended at Discharge Frequent or constant Supervision/Assistance  Patient can return home with the following  A little help with walking and/or transfers;A little help with bathing/dressing/bathroom;Assistance with cooking/housework;Direct supervision/assist for medications management;Direct supervision/assist for financial management;Assist for transportation;Help with stairs or ramp for entrance    Equipment Recommendations Other (comment) (TBD)  Recommendations for Other Services       Functional Status Assessment Patient has had a recent decline in their  functional status and demonstrates the ability to make significant improvements in function in a reasonable and predictable amount of time.     Precautions / Restrictions Precautions Precautions: Fall Precaution Comments: watch 02 Restrictions Weight Bearing Restrictions: No      Mobility  Bed Mobility Overal bed mobility: Needs Assistance Bed Mobility: Supine to Sit     Supine to sit: Supervision     General bed mobility comments: HOB up, supervision for safety and lines    Transfers Overall transfer level: Needs assistance Equipment used: 1 person hand held assist Transfers: Sit to/from Stand Sit to Stand: Min assist           General transfer comment: steadying assist    Ambulation/Gait Ambulation/Gait assistance: Mod assist Gait Distance (Feet): 120 Feet Assistive device: 1 person hand held assist Gait Pattern/deviations: Step-through pattern, Decreased stride length, Drifts right/left, Staggering right Gait velocity: decreased     General Gait Details: up to modA for mobility as patient drifting L/R and at times staggering to R with HHAx1  Stairs            Wheelchair Mobility    Modified Rankin (Stroke Patients Only)       Balance Overall balance assessment: Needs assistance   Sitting balance-Leahy Scale: Fair     Standing balance support: Single extremity supported Standing balance-Leahy Scale: Poor                               Pertinent Vitals/Pain Pain Assessment Pain Assessment: Faces Faces Pain Scale: Hurts even more Pain Location: sacrum Pain Descriptors / Indicators: Sore Pain Intervention(s): Repositioned, Monitored during session    Home Living Family/patient expects to be discharged to:: Assisted living (pt resides in memory care)  Prior Function Prior Level of Function : Needs assist             Mobility Comments: walks without a device ADLs Comments: supervision for  ADLs, assist for all IADLs     Hand Dominance   Dominant Hand: Right    Extremity/Trunk Assessment   Upper Extremity Assessment Upper Extremity Assessment: Defer to OT evaluation    Lower Extremity Assessment Lower Extremity Assessment: Generalized weakness    Cervical / Trunk Assessment Cervical / Trunk Assessment: Kyphotic  Communication   Communication: HOH  Cognition Arousal/Alertness: Awake/alert Behavior During Therapy: WFL for tasks assessed/performed Overall Cognitive Status: History of cognitive impairments - at baseline                                 General Comments: pt with hx of dementia        General Comments General comments (skin integrity, edema, etc.): On 3L O2 [place] with spO2 dropping as low as 84% after mobility. Increased to 4L O2 with slow increase to 91%    Exercises     Assessment/Plan    PT Assessment Patient needs continued PT services  PT Problem List Decreased strength;Decreased activity tolerance;Decreased balance;Decreased mobility;Decreased cognition;Decreased safety awareness;Decreased knowledge of use of DME;Decreased knowledge of precautions;Cardiopulmonary status limiting activity       PT Treatment Interventions DME instruction;Gait training;Functional mobility training;Therapeutic activities;Therapeutic exercise;Balance training;Patient/family education    PT Goals (Current goals can be found in the Care Plan section)  Acute Rehab PT Goals Patient Stated Goal: did not state PT Goal Formulation: Patient unable to participate in goal setting Time For Goal Achievement: 11/04/21 Potential to Achieve Goals: Good    Frequency Min 2X/week     Co-evaluation               AM-PAC PT "6 Clicks" Mobility  Outcome Measure Help needed turning from your back to your side while in a flat bed without using bedrails?: A Little Help needed moving from lying on your back to sitting on the side of a flat bed without using  bedrails?: A Little Help needed moving to and from a bed to a chair (including a wheelchair)?: A Little Help needed standing up from a chair using your arms (e.g., wheelchair or bedside chair)?: A Little Help needed to walk in hospital room?: A Lot Help needed climbing 3-5 steps with a railing? : A Lot 6 Click Score: 16    End of Session Equipment Utilized During Treatment: Oxygen Activity Tolerance: Patient limited by fatigue Patient left: in bed;with call bell/phone within reach;Other (comment) (transport present to take for x-ray) Nurse Communication: Mobility status;Other (comment) (O2 requirements) PT Visit Diagnosis: Unsteadiness on feet (R26.81);Muscle weakness (generalized) (M62.81);History of falling (Z91.81)    Time: 1333-1400 PT Time Calculation (min) (ACUTE ONLY): 27 min   Charges:   PT Evaluation $PT Eval Moderate Complexity: 1 Mod          Tyeler Goedken A. Dan Humphreys PT, DPT Acute Rehabilitation Services Office [phone]   Viviann Spare 10/21/2021, 4:53 PM

## 2021-10-21 NOTE — TOC Progression Note (Addendum)
Transition of Care [facility]) - Progression Note    Patient Details  Name: Jonathan Harmon MRN: [id number] Date of Birth: 1933-08-19  [facility] CM/SW Contact  Ivette Loyal, [place] Phone Number: 10/21/2021, 2:44 PM  Clinical Narrative:    CSW spoke with [place] Theatre manager) from [place] who is okay with pt returning with o2 as long as it does not exceed 3L. Deanna Artis also would like to use [employer] for o2 if needed to DC back to [place]. Deanna Artis asked that if pt needs to go to a SNF that he be sent to  [facility] bc their doctor is at [place] as well and can continue to follow him there. Deanna Artis can be reached at [phone].   CSW contacted pt son to provide update and confirm [facility] as SNF choice if available, pt son is agreeable.    Pt has bed at [facility] and will DC when medically stable.    Expected Discharge Plan: Memory Care Barriers to Discharge: Continued Medical Work up  Expected Discharge Plan and Services Expected Discharge Plan: Memory Care In-house Referral: Clinical Social Work   Post Acute Care Choice: Long Term Acute Care (LTAC) (Memory Care)                                         Social Determinants of Health (SDOH) Interventions    Readmission Risk Interventions     No data to display

## 2021-10-21 NOTE — Care Management Important Message (Signed)
Important Message  Patient Details  Name: Jonathan Harmon MRN: [id number] Date of Birth: 07/14/1933   Medicare Important Message Given:  Yes     Vinaya Sancho [place] 10/21/2021, 2:49 PM

## 2021-10-21 NOTE — Progress Notes (Signed)
  Progress Note Patient: Jonathan Harmon YKD:[id number] DOB: 12/07/1933 DOA: 10/18/2021  DOS: the patient was seen and examined on 10/21/2021  Brief hospital course: Past medical history of dementia, COPD, chronic respiratory failure, dementia at memory care unit presents to the hospital with complaints of confusion and shortness of breath found to have acute pneumonia. Assessment and Plan:   Community acquired pneumonia, bilateral with mucus plugging   COPD (chronic obstructive pulmonary disease) (HCC)   Chronic respiratory failure with hypoxia (HCC)   UIP (usual interstitial pneumonitis) (HCC) For now we will continue with IV antibiotics. Continue Mucinex. Able to ambulate well with physical therapy. WBC count worsening. Continue as needed nebulizer therapy. Continue oxygen.  Appears to be at its baseline. Does not appear to have any exacerbation for now.     Dementia (HCC)   Acute metabolic encephalopathy Mentation progressively worsening for last few days. Likely due to hypoxia. Possibility of progression of dementia cannot be ruled out. For now we will monitor. Oral intake improving.     H/O laceration repair 6/21 on face Sutures are absorbable. Monitor.     Protein-calorie malnutrition, severe Placing the patient at high risk of poor outcome. Underweight with BMI of 17. Appears to be a chronic issue for this patient.     Hypernatremia From poor p.o. intake.   Goals of care discussion. Discussed with son.  Patient is DNR. Updated  Subjective: No nausea no vomiting.  Reports fatigue and tiredness.  Ambulated 120 feet with physical therapy.  On 4 L.  Physical Exam: Vitals:   10/21/21 0758 10/21/21 0927 10/21/21 1939 10/21/21 2007  BP:  121/68  137/71  Pulse:  66 68 66  Resp:  18 18 20   Temp:  97.6 F (36.4 C)  98 F (36.7 C)  TempSrc:  Oral  Oral  SpO2: 97%  95% 95%  Height:       General: Appear in moderate distress; no visible Abnormal Neck Mass Or lumps,  Conjunctiva normal Cardiovascular: S1 and S2 Present, no Murmur, Respiratory: good respiratory effort, Bilateral Air entry present and right-sided crackles, no wheezes Abdomen: Bowel Sound present, Non tender  Extremities: no Pedal edema Neurology: alert and oriented to time, place, and person  Gait not checked due to patient safety concerns   Data Reviewed: I have Reviewed nursing notes, Vitals, and Lab results since pt's last encounter. Pertinent lab results CBC and BMP I have ordered test including CBC and BMP I have independently visualized and interpreted imaging chest x-ray which showed stable finding.  No worsening of pneumonia..   Family Communication: None at bedside  Disposition: Status is: Inpatient Remains inpatient appropriate because: Likely to SNF in 1 to 2 days.  Currently has leukocytosis therefore continue antibiotics.  Author: , MD 10/21/2021 8:10 PM  Please look on [email or web address] to find out who is on call.

## 2021-10-21 NOTE — Progress Notes (Signed)
Mobility Specialist Progress Note:   10/21/21 0930  Mobility  Activity Turned to back - supine  Range of Motion/Exercises Active;All extremities  Assistive Device None  Activity Response Tolerated well  $Mobility charge 1 Mobility   Responded to bed alarm, pt found sliding toward EOB. Repositioned pt, pt declining ambulation at this time d/t fatigue. Pt left with all needs met. Will return as schedule allows.   Nelta Numbers Acute Rehab Secure Chat or Office Phone: [phone]

## 2021-10-21 NOTE — Social Work (Signed)
RE: Jonathan Harmon Date of Birth: 05/29/1933 Date: 10/21/2021  Please be advised that the above-named patient will require a short-term nursing home stay - anticipated 30 days or less for rehabilitation and strengthening.  The plan is for return home.

## 2021-10-21 NOTE — Evaluation (Signed)
Occupational Therapy Evaluation Patient Details Name: Jonathan Harmon MRN: [id number] DOB: January 16, 1934 Today's Date: 10/21/2021   History of Present Illness Pt is an 88 year old man admitted from [facility] memory care facility on 10/18/21 with confusion and shortness of breath. Pt diagnosed with PNA. PMH: COPD, dementia, asbestosis, chronic hypoxic respiratory failure, HLD, HTN, multiple bouts of PNA.   Clinical Impression   Per chart review, pt walked without AD and was supervised for ADLs. His ALF assisted with IADLs. Pt presents with cognitive impairment, likely baseline, generalized weakness, impaired standing balance and decreased activity tolerance. Sp02 dropped to 84% on 3L with ambulation. Pt anxious, pulling nasal cannula off when short of breath. Increased 02 to 4L with pt recovering to 92% with seated rest break. Pt ambulated in hall with +2 min assist (hand held). He did not monitor for need for rest break. Pt needs set up to moderate assistance for ADLs. Recommending SNF for ST rehab prior to return to his memory care facility.      Recommendations for follow up therapy are one component of a multi-disciplinary discharge planning process, led by the attending physician.  Recommendations may be updated based on patient status, additional functional criteria and insurance authorization.   Follow Up Recommendations  Skilled nursing-short term rehab (<3 hours/day)    Assistance Recommended at Discharge Frequent or constant Supervision/Assistance  Patient can return home with the following A lot of help with walking and/or transfers;A lot of help with bathing/dressing/bathroom;Assistance with cooking/housework;Direct supervision/assist for medications management;Direct supervision/assist for financial management;Assist for transportation;Help with stairs or ramp for entrance    Functional Status Assessment  Patient has had a recent decline in their functional status and demonstrates the  ability to make significant improvements in function in a reasonable and predictable amount of time.  Equipment Recommendations  None recommended by OT    Recommendations for Other Services       Precautions / Restrictions Precautions Precautions: Fall Precaution Comments: watch 02      Mobility Bed Mobility Overal bed mobility: Needs Assistance Bed Mobility: Supine to Sit     Supine to sit: Supervision     General bed mobility comments: HOB up, supervision for safety and lines    Transfers Overall transfer level: Needs assistance Equipment used: 1 person hand held assist Transfers: Sit to/from Stand Sit to Stand: Min assist           General transfer comment: steadying assist      Balance Overall balance assessment: Needs assistance   Sitting balance-Leahy Scale: Fair     Standing balance support: Single extremity supported Standing balance-Leahy Scale: Poor                             ADL either performed or assessed with clinical judgement   ADL Overall ADL's : Needs assistance/impaired Eating/Feeding: Set up;Sitting   Grooming: Set up;Sitting   Upper Body Bathing: Minimal assistance;Sitting   Lower Body Bathing: Moderate assistance;Sit to/from stand   Upper Body Dressing : Minimal assistance;Sitting   Lower Body Dressing: Moderate assistance;Sit to/from stand   Toilet Transfer: +2 for physical assistance;Minimal assistance           Functional mobility during ADLs: +2 for physical assistance;Minimal assistance       Vision Ability to See in Adequate Light: 0 Adequate       Perception     Praxis      Pertinent Vitals/Pain Pain Assessment  Pain Assessment: Faces Faces Pain Scale: Hurts even more Pain Location: sacrum Pain Descriptors / Indicators: Sore Pain Intervention(s): Repositioned, Monitored during session     Hand Dominance Right   Extremity/Trunk Assessment Upper Extremity Assessment Upper Extremity  Assessment: Generalized weakness   Lower Extremity Assessment Lower Extremity Assessment: Defer to PT evaluation   Cervical / Trunk Assessment Cervical / Trunk Assessment: Kyphotic   Communication Communication Communication: HOH   Cognition Arousal/Alertness: Awake/alert Behavior During Therapy: WFL for tasks assessed/performed Overall Cognitive Status: History of cognitive impairments - at baseline                                 General Comments: pt with hx of dementia     General Comments       Exercises     Shoulder Instructions      Home Living Family/patient expects to be discharged to:: Assisted living (pt resides in memory care)                                        Prior Functioning/Environment Prior Level of Function : Needs assist             Mobility Comments: walks without a device ADLs Comments: supervision for ADLs, assist for all IADLs        OT Problem List: Decreased strength;Decreased activity tolerance;Impaired balance (sitting and/or standing);Decreased cognition;Decreased safety awareness;Decreased knowledge of use of DME or AE;Cardiopulmonary status limiting activity      OT Treatment/Interventions: Self-care/ADL training;DME and/or AE instruction;Energy conservation;Therapeutic activities;Patient/family education;Balance training    OT Goals(Current goals can be found in the care plan section) Acute Rehab OT Goals OT Goal Formulation: Patient unable to participate in goal setting Time For Goal Achievement: 11/04/21 Potential to Achieve Goals: Good ADL Goals Pt Will Perform Grooming: with min guard assist;standing Pt Will Perform Upper Body Dressing: with supervision;sitting Pt Will Perform Lower Body Dressing: with min guard assist;sit to/from stand Pt Will Transfer to Toilet: with min guard assist;bedside commode Pt Will Perform Toileting - Clothing Manipulation and hygiene: with min guard assist;sit  to/from stand  OT Frequency: Min 2X/week    Co-evaluation              AM-PAC OT "6 Clicks" Daily Activity     Outcome Measure Help from another person eating meals?: A Little Help from another person taking care of personal grooming?: A Little Help from another person toileting, which includes using toliet, bedpan, or urinal?: A Lot Help from another person bathing (including washing, rinsing, drying)?: A Little Help from another person to put on and taking off regular upper body clothing?: A Lot   6 Click Score: 13   End of Session Equipment Utilized During Treatment: Oxygen (3-4 L) Nurse Communication: Mobility status;Other (comment) (aware of pt's 02 needs)  Activity Tolerance: Patient tolerated treatment well Patient left: in bed (going to xray)  OT Visit Diagnosis: Unsteadiness on feet (R26.81);Other abnormalities of gait and mobility (R26.89);Muscle weakness (generalized) (M62.81);Other symptoms and signs involving cognitive function;Other (comment) (decreased activity tolerance)                Time: 7353-2992 OT Time Calculation (min): 29 min Charges:  OT General Charges $OT Visit: 1 Visit OT Evaluation $OT Eval Moderate Complexity: 1 Mod  Berna Spare, OTR/L Acute Rehabilitation Services Office: [phone]  Evern Bio 10/21/2021, 3:18 PM

## 2021-10-22 ENCOUNTER — Other Ambulatory Visit (HOSPITAL_COMMUNITY): Payer: Self-pay

## 2021-10-22 ENCOUNTER — Other Ambulatory Visit (HOSPITAL_BASED_OUTPATIENT_CLINIC_OR_DEPARTMENT_OTHER): Payer: Self-pay

## 2021-10-22 DIAGNOSIS — J189 Pneumonia, unspecified organism: Secondary | ICD-10-CM | POA: Diagnosis not present

## 2021-10-22 LAB — CBC WITH DIFFERENTIAL/PLATELET
Abs Immature Granulocytes: 0.42 10*3/uL — ABNORMAL HIGH (ref 0.00–0.07)
Basophils Absolute: 0.1 10*3/uL (ref 0.0–0.1)
Basophils Relative: 1 %
Eosinophils Absolute: 0.1 10*3/uL (ref 0.0–0.5)
Eosinophils Relative: 1 %
HCT: 38.2 % — ABNORMAL LOW (ref 39.0–52.0)
Hemoglobin: 12.3 g/dL — ABNORMAL LOW (ref 13.0–17.0)
Immature Granulocytes: 3 %
Lymphocytes Relative: 9 %
Lymphs Abs: 1.3 10*3/uL (ref 0.7–4.0)
MCH: 31.1 pg (ref 26.0–34.0)
MCHC: 32.2 g/dL (ref 30.0–36.0)
MCV: 96.7 fL (ref 80.0–100.0)
Monocytes Absolute: 0.9 10*3/uL (ref 0.1–1.0)
Monocytes Relative: 6 %
Neutro Abs: 11.1 10*3/uL — ABNORMAL HIGH (ref 1.7–7.7)
Neutrophils Relative %: 80 %
Platelets: 256 10*3/uL (ref 150–400)
RBC: 3.95 MIL/uL — ABNORMAL LOW (ref 4.22–5.81)
RDW: 12.6 % (ref 11.5–15.5)
WBC: 13.9 10*3/uL — ABNORMAL HIGH (ref 4.0–10.5)
nRBC: 0 % (ref 0.0–0.2)

## 2021-10-22 LAB — BASIC METABOLIC PANEL
Anion gap: 8 (ref 5–15)
BUN: 12 mg/dL (ref 8–23)
CO2: 31 mmol/L (ref 22–32)
Calcium: 8.2 mg/dL — ABNORMAL LOW (ref 8.9–10.3)
Chloride: 103 mmol/L (ref 98–111)
Creatinine, Ser: 0.77 mg/dL (ref 0.61–1.24)
GFR, Estimated: >60 mL/min (ref >60)
Glucose, Bld: 94 mg/dL (ref 70–99)
Potassium: 3.7 mmol/L (ref 3.5–5.1)
Sodium: 142 mmol/L (ref 135–145)

## 2021-10-22 LAB — MAGNESIUM: Magnesium: 1.5 mg/dL — ABNORMAL LOW (ref 1.7–2.4)

## 2021-10-22 MED ORDER — CEFDINIR 300 MG PO CAPS
300.0000 mg | ORAL_CAPSULE | Freq: Two times a day (BID) | ORAL | Status: DC
  Administered 2021-10-22: 300 mg via ORAL
  Filled 2021-10-22 (×2): qty 1

## 2021-10-22 MED ORDER — PREDNISONE 50 MG PO TABS: 50.0000 mg | ORAL_TABLET | Freq: Every day | ORAL | Status: DC

## 2021-10-22 MED ORDER — ENSURE ENLIVE PO LIQD
237.0000 mL | Freq: Three times a day (TID) | ORAL | 0 refills | Status: DC
  Filled 2021-10-22: qty 10000, 14d supply, fill #0

## 2021-10-22 MED ORDER — AZITHROMYCIN 500 MG PO TABS
500.0000 mg | ORAL_TABLET | Freq: Every day | ORAL | 0 refills | Status: AC
Start: 2021-10-22 — End: 2021-10-25
  Filled 2021-10-22: qty 3, 3d supply, fill #0

## 2021-10-22 MED ORDER — FLUTICASONE PROPIONATE 50 MCG/ACT NA SUSP
2.0000 | Freq: Every day | NASAL | 0 refills | Status: AC
  Filled 2021-10-22: qty 16, 30d supply, fill #0

## 2021-10-22 MED ORDER — ONE-A-DAY MENS PO TABS
1.0000 | ORAL_TABLET | Freq: Every day | ORAL | 0 refills | Status: AC
  Filled 2021-10-22: qty 120, 120d supply, fill #0

## 2021-10-22 MED ORDER — PANTOPRAZOLE SODIUM 40 MG PO TBEC
40.0000 mg | DELAYED_RELEASE_TABLET | Freq: Every day | ORAL | 0 refills | Status: DC
  Filled 2021-10-22: qty 30, 30d supply, fill #0

## 2021-10-22 MED ORDER — IPRATROPIUM-ALBUTEROL 0.5-2.5 (3) MG/3ML IN SOLN
3.0000 mL | RESPIRATORY_TRACT | 0 refills | Status: AC | PRN
  Filled 2021-10-22 (×3): qty 360, 20d supply, fill #0

## 2021-10-22 MED ORDER — CEFDINIR 300 MG PO CAPS
300.0000 mg | ORAL_CAPSULE | Freq: Two times a day (BID) | ORAL | 0 refills | Status: AC
Start: 2021-10-22 — End: 2021-10-26
  Filled 2021-10-22: qty 8, 4d supply, fill #0

## 2021-10-22 MED ORDER — PREDNISONE 10 MG PO TABS
ORAL_TABLET | ORAL | 0 refills | Status: DC
  Filled 2021-10-22: qty 45, 15d supply, fill #0

## 2021-10-22 MED ORDER — MAGNESIUM SULFATE 2 GM/50ML IV SOLN
2.0000 g | Freq: Once | INTRAVENOUS | Status: AC
  Administered 2021-10-22: 2 g via INTRAVENOUS
  Filled 2021-10-22: qty 50

## 2021-10-22 NOTE — Discharge Summary (Addendum)
Physician Discharge Summary   Patient: Jonathan Harmon MRN: [id number] DOB: 1933/09/06  Admit date:     10/18/2021  Discharge date: 10/22/21  Discharge Physician: Lynden Oxford  PCP: Esperanza Richters, PA-C  Recommendations at discharge: Follow-up with PCP in 1 week.  Discharge Diagnoses: Principal Problem:   Community acquired pneumonia, bilateral with mucus plugging Active Problems:   COPD (chronic obstructive pulmonary disease) (HCC)   Chronic respiratory failure with hypoxia (HCC)   UIP (usual interstitial pneumonitis) (HCC)   Dementia (HCC)   Acute metabolic encephalopathy   H/O laceration repair 6/21 on face   Protein-calorie malnutrition, severe   Hypernatremia  Hospital Course: Past medical history of dementia, COPD, chronic respiratory failure, dementia at memory care unit presents to the hospital with complaints of confusion and shortness of breath found to have acute pneumonia.  Assessment and Plan: Community acquired pneumonia, bilateral with mucus plugging COPD (chronic obstructive pulmonary disease) (HCC) Chronic respiratory failure with hypoxia (HCC) UIP (usual interstitial pneumonitis) (HCC) Presents with hypoxia and cough and shortness of breath. Chest x-ray shows possibility of pneumonia. Blood cultures negative. Procalcitonin negative. Treated with IV antibiotics. Currently on oral iron regimen. Continue Mucinex continue pulmonary toilet, continue nebulizer therapy as needed. We will add steroids as well. Continue 4 L of oxygen on discharge.   Dementia (HCC) Acute metabolic encephalopathy Mentation was progressively worsening for last few days. Likely due to hypoxia. Possibility of progression of dementia cannot be ruled out. Oral intake improving as well as mentation.  Monitor.   H/O laceration repair 6/21 on face Sutures are absorbable. Monitor.   Protein-calorie malnutrition, severe Placing the patient at high risk of poor outcome. Underweight  with BMI of 17. Appears to be a chronic issue for this patient.   Hypernatremia From poor p.o. intake.  Now resolved.  Mood disorder Continue zoloft. Prior to admission medication.  No acute decompensation   Goals of care discussion. Discussed with son.  Patient is DNR. Updated.  Patient does have multiple comorbidities although currently difficult to predict if the prognosis is less than 6 months or not.  I suspect that the patient may have another event of infection and next 6 months given his frailty and malnutrition as well as COPD and should that occur, palliative care consult and consideration of hospice may not be a bad idea. Would recommend discussion about MOST form with the PCP.  Consultants: none Procedures performed:  none DISCHARGE MEDICATION: Allergies as of 10/22/2021       Reactions   Penicillin G Swelling   Penicillins Rash   Reported to family member - occurred when he was in his 70s         Medication List     STOP taking these medications    doxycycline 100 MG capsule Commonly known as: VIBRAMYCIN       TAKE these medications    albuterol 108 (90 Base) MCG/ACT inhaler Commonly known as: VENTOLIN HFA inhale 2 puffs by mouth into the lungs every 6 hours as needed for wheezing and shortness of breath What changed:  how much to take when to take this   atorvastatin 40 MG tablet Commonly known as: LIPITOR TAKE 1 TABLET BY MOUTH ONCE DAILY   azithromycin 500 MG tablet Commonly known as: ZITHROMAX Take 1 tablet (500 mg total) by mouth daily for 3 days.   cefdinir 300 MG capsule Commonly known as: OMNICEF Take 1 capsule (300 mg total) by mouth 2 (two) times daily for 4 days.  divalproex 125 MG capsule Commonly known as: DEPAKOTE SPRINKLE Take 125 mg by mouth 2 (two) times daily.   donepezil 10 MG tablet Commonly known as: ARICEPT Take 1 tablet (10 mg total) by mouth daily.   feeding supplement Liqd Take 237 mLs by mouth 3 (three)  times daily between meals.   fluticasone 50 MCG/ACT nasal spray Commonly known as: FLONASE Place 2 sprays into both nostrils daily. Start taking on: October 23, 2021   guaiFENesin 600 MG 12 hr tablet Commonly known as: MUCINEX Take 600 mg by mouth 2 (two) times daily.   ipratropium-albuterol 0.5-2.5 (3) MG/3ML Soln Commonly known as: DUONEB Take 3 mLs by nebulization every 4 (four) hours as needed.   multivitamin Tabs tablet Take 1 tablet by mouth daily.   pantoprazole 40 MG tablet Commonly known as: PROTONIX Take 1 tablet (40 mg total) by mouth daily at 6 (six) AM. Start taking on: October 23, 2021   predniSONE 10 MG tablet Commonly known as: DELTASONE Take 50mg  daily for 3days,Take 40mg  daily for 3days,Take 30mg  daily for 3days,Take 20mg  daily for 3days,Take 10mg  daily for 3days, then stop What changed:  medication strength how much to take how to take this when to take this additional instructions   sertraline 50 MG tablet Commonly known as: ZOLOFT Take 50 mg by mouth daily.   Stiolto Respimat 2.5-2.5 MCG/ACT Aers Generic drug: Tiotropium Bromide-Olodaterol Inhale 1 puff into the lungs daily. What changed: how much to take        Follow-up Information     [place], . Schedule an appointment as soon as possible for a visit in 2 week(s).   Specialties: Internal Medicine, Family Medicine Contact information: [address] [place] [phone]                Disposition: SNF Diet recommendation: Regular diet  Discharge Exam: Vitals:   10/21/21 2007 10/22/21 0419 10/22/21 0754 10/22/21 0822  BP: 137/71 122/69 125/75   Pulse: 66 60 60   Resp: 20 18 17 16   Temp: 98 F (36.7 C) 98.2 F (36.8 C) 98 F (36.7 C)   TempSrc: Oral Oral Oral   SpO2: 95% 95% 97% 97%  Height:       General: Appear in mild distress; no visible Abnormal Neck Mass Or lumps, Conjunctiva normal Cardiovascular: S1 and S2 Present, no  Murmur, Respiratory: good respiratory effort, Bilateral Air entry present and bilateral right more than left crackles, no wheezes Abdomen: Bowel Sound present, Non tender  Extremities: no Pedal edema Neurology: alert and oriented to place and person  Gait not checked due to patient safety concerns There were no vitals filed for this visit. Condition at discharge: stable  The results of significant diagnostics from this hospitalization (including imaging, microbiology, ancillary and laboratory) are listed below for reference.   Imaging Studies: DG Chest 2 View  Result Date: 10/21/2021 CLINICAL DATA:  Shortness of breath.  Pneumonia.  Weakness. EXAM: CHEST - 2 VIEW COMPARISON:  10/18/2021 radiography and CT FINDINGS: No change. Widespread pulmonary scarring and calcified pleural plaques consistent with asbestosis. No identifiable acute consolidation, collapse or effusion. Bony structures unremarkable. IMPRESSION: Chronic findings of asbestosis. No acute infiltrate or collapse demonstrable by radiography. Electronically Signed   By: 10/24/21 M.D.   On: 10/21/2021 14:24   CT Chest Wo Contrast  Result Date: 10/18/2021 CLINICAL DATA:  Assess for pneumonia.  Shortness of breath. EXAM: CT CHEST WITHOUT CONTRAST TECHNIQUE: Multidetector CT  imaging of the chest was performed following the standard protocol without IV contrast. RADIATION DOSE REDUCTION: This exam was performed according to the departmental dose-optimization program which includes automated exposure control, adjustment of the mA and/or kV according to patient size and/or use of iterative reconstruction technique. COMPARISON:  Chest x-ray same day.  CT chest 06/01/2020. FINDINGS: Cardiovascular: Aorta is ectatic without focal dilatation. Heart is normal in size. There is no pericardial effusion. There are atherosclerotic calcifications of the aorta and coronary arteries. Mediastinum/Nodes: No enlarged mediastinal or axillary lymph nodes.  Thyroid gland, trachea, and esophagus demonstrate no significant findings. Lungs/Pleura: Severe emphysematous changes are again seen. There are extensive bilateral calcified pleural plaques, unchanged from prior. There is new airspace disease within the inferior posterior bilateral lower lobes with associated peribronchial wall thickening and mucous plugging, new from prior. Areas of peripheral scarring in the right upper lobe appear unchanged from prior. No pleural effusion or pneumothorax identified. Upper Abdomen: No acute abnormality. Musculoskeletal: No chest wall mass or suspicious bone lesions identified. IMPRESSION: 1. New small amount of bilateral lower lobe airspace disease with peribronchial wall thickening and mucous plugging compatible with infection. 2. Stable extensive calcified pleural plaques compatible with prior asbestos exposure. Stable scarring and chronic changes in the right upper lobe. 3. Aortic Atherosclerosis (ICD10-I70.0) and Emphysema (ICD10-J43.9). Electronically Signed   By: Ronney Asters M.D.   On: 10/18/2021 20:22   DG Chest Port 1 View  Result Date: 10/18/2021 CLINICAL DATA:  Provided history: Dyspnea. EXAM: PORTABLE CHEST 1 VIEW COMPARISON:  Prior chest radiographs 10/13/2021 and earlier. FINDINGS: Heart size within normal limits. As before, there are extensive calcified pleural plaques bilaterally. No appreciable superimposed acute airspace consolidation. No evidence of pulmonary edema. No pleural effusion or pneumothorax. No acute bony abnormality identified. Thoracic levocurvature. Degenerative changes of the spine. IMPRESSION: No evidence of acute cardiopulmonary abnormality. Unchanged extensive calcified pleural plaques, bilaterally. Electronically Signed   By: Kellie Simmering D.O.   On: 10/18/2021 15:31   CT HEAD WO CONTRAST (5MM)  Result Date: 10/13/2021 CLINICAL DATA:  Possible recent fall, initial encounter EXAM: CT HEAD WITHOUT CONTRAST CT MAXILLOFACIAL WITHOUT  CONTRAST CT CERVICAL SPINE WITHOUT CONTRAST TECHNIQUE: Multidetector CT imaging of the head, cervical spine, and maxillofacial structures were performed using the standard protocol without intravenous contrast. Multiplanar CT image reconstructions of the cervical spine and maxillofacial structures were also generated. RADIATION DOSE REDUCTION: This exam was performed according to the departmental dose-optimization program which includes automated exposure control, adjustment of the mA and/or kV according to patient size and/or use of iterative reconstruction technique. COMPARISON:  None Available. FINDINGS: CT HEAD FINDINGS Brain: No evidence of acute infarction, hemorrhage, hydrocephalus, extra-axial collection or mass lesion/mass effect. Chronic atrophic and ischemic changes are noted. Vascular: No hyperdense vessel or unexpected calcification. Skull: Normal. Negative for fracture or focal lesion. Other: None. CT MAXILLOFACIAL FINDINGS Osseous: No fracture or mandibular dislocation. No destructive process. Orbits: Orbits and their contents are within normal limits. Sinuses: Paranasal sinuses are well aerated without mucosal abnormality. Soft tissues: Surrounding soft tissue structures are within normal limits. CT CERVICAL SPINE FINDINGS Alignment: Within normal limits. Skull base and vertebrae: 7 cervical segments are well visualized. Vertebral body height is well maintained. Well corticated densities are noted adjacent to the tip of the odontoid consistent with prior fracture and nonunion. No acute fracture or acute facet abnormality is noted. Multilevel osteophytic changes and facet hypertrophic changes are noted. Soft tissues and spinal canal: Surrounding soft tissue structures are within  normal limits. Vascular calcifications are seen. Upper chest: Emphysematous changes are noted in the lung apices. Mild pleural plaquing is seen as well. Other: None IMPRESSION: CT of the head: No acute intracranial abnormality  noted. Chronic atrophic and ischemic changes. CT of the maxillofacial bones: No acute bony abnormality noted. CT of the cervical spine: Multilevel degenerative change. No acute abnormality noted. Electronically Signed   By: Inez Catalina M.D.   On: 10/13/2021 21:22   CT Maxillofacial Wo Contrast  Result Date: 10/13/2021 CLINICAL DATA:  Possible recent fall, initial encounter EXAM: CT HEAD WITHOUT CONTRAST CT MAXILLOFACIAL WITHOUT CONTRAST CT CERVICAL SPINE WITHOUT CONTRAST TECHNIQUE: Multidetector CT imaging of the head, cervical spine, and maxillofacial structures were performed using the standard protocol without intravenous contrast. Multiplanar CT image reconstructions of the cervical spine and maxillofacial structures were also generated. RADIATION DOSE REDUCTION: This exam was performed according to the departmental dose-optimization program which includes automated exposure control, adjustment of the mA and/or kV according to patient size and/or use of iterative reconstruction technique. COMPARISON:  None Available. FINDINGS: CT HEAD FINDINGS Brain: No evidence of acute infarction, hemorrhage, hydrocephalus, extra-axial collection or mass lesion/mass effect. Chronic atrophic and ischemic changes are noted. Vascular: No hyperdense vessel or unexpected calcification. Skull: Normal. Negative for fracture or focal lesion. Other: None. CT MAXILLOFACIAL FINDINGS Osseous: No fracture or mandibular dislocation. No destructive process. Orbits: Orbits and their contents are within normal limits. Sinuses: Paranasal sinuses are well aerated without mucosal abnormality. Soft tissues: Surrounding soft tissue structures are within normal limits. CT CERVICAL SPINE FINDINGS Alignment: Within normal limits. Skull base and vertebrae: 7 cervical segments are well visualized. Vertebral body height is well maintained. Well corticated densities are noted adjacent to the tip of the odontoid consistent with prior fracture and  nonunion. No acute fracture or acute facet abnormality is noted. Multilevel osteophytic changes and facet hypertrophic changes are noted. Soft tissues and spinal canal: Surrounding soft tissue structures are within normal limits. Vascular calcifications are seen. Upper chest: Emphysematous changes are noted in the lung apices. Mild pleural plaquing is seen as well. Other: None IMPRESSION: CT of the head: No acute intracranial abnormality noted. Chronic atrophic and ischemic changes. CT of the maxillofacial bones: No acute bony abnormality noted. CT of the cervical spine: Multilevel degenerative change. No acute abnormality noted. Electronically Signed   By: Inez Catalina M.D.   On: 10/13/2021 21:22   CT Cervical Spine Wo Contrast  Result Date: 10/13/2021 CLINICAL DATA:  Possible recent fall, initial encounter EXAM: CT HEAD WITHOUT CONTRAST CT MAXILLOFACIAL WITHOUT CONTRAST CT CERVICAL SPINE WITHOUT CONTRAST TECHNIQUE: Multidetector CT imaging of the head, cervical spine, and maxillofacial structures were performed using the standard protocol without intravenous contrast. Multiplanar CT image reconstructions of the cervical spine and maxillofacial structures were also generated. RADIATION DOSE REDUCTION: This exam was performed according to the departmental dose-optimization program which includes automated exposure control, adjustment of the mA and/or kV according to patient size and/or use of iterative reconstruction technique. COMPARISON:  None Available. FINDINGS: CT HEAD FINDINGS Brain: No evidence of acute infarction, hemorrhage, hydrocephalus, extra-axial collection or mass lesion/mass effect. Chronic atrophic and ischemic changes are noted. Vascular: No hyperdense vessel or unexpected calcification. Skull: Normal. Negative for fracture or focal lesion. Other: None. CT MAXILLOFACIAL FINDINGS Osseous: No fracture or mandibular dislocation. No destructive process. Orbits: Orbits and their contents are within  normal limits. Sinuses: Paranasal sinuses are well aerated without mucosal abnormality. Soft tissues: Surrounding soft tissue structures are within normal  limits. CT CERVICAL SPINE FINDINGS Alignment: Within normal limits. Skull base and vertebrae: 7 cervical segments are well visualized. Vertebral body height is well maintained. Well corticated densities are noted adjacent to the tip of the odontoid consistent with prior fracture and nonunion. No acute fracture or acute facet abnormality is noted. Multilevel osteophytic changes and facet hypertrophic changes are noted. Soft tissues and spinal canal: Surrounding soft tissue structures are within normal limits. Vascular calcifications are seen. Upper chest: Emphysematous changes are noted in the lung apices. Mild pleural plaquing is seen as well. Other: None IMPRESSION: CT of the head: No acute intracranial abnormality noted. Chronic atrophic and ischemic changes. CT of the maxillofacial bones: No acute bony abnormality noted. CT of the cervical spine: Multilevel degenerative change. No acute abnormality noted. Electronically Signed   By: Inez Catalina M.D.   On: 10/13/2021 21:22   DG Chest Port 1 View  Result Date: 10/13/2021 CLINICAL DATA:  sob EXAM: PORTABLE CHEST 1 VIEW COMPARISON:  December 19, 2020 FINDINGS: The heart size and mediastinal contours are stable. No cardiomegaly. No focal consolidation, pleural effusion or vascular congestion. Again seen are the extensive bilateral calcified pleural plaques. Moderate thoracolumbar spondylosis. IMPRESSION: No active disease. COPD changes. Stable extensive bilateral calcified pleural plaques. Electronically Signed   By: Frazier Richards M.D.   On: 10/13/2021 13:10    Microbiology: Results for orders placed or performed during the hospital encounter of 10/18/21  Expectorated Sputum Assessment w Gram Stain, Rflx to Resp Cult     Status: None (Preliminary result)   Collection Time: 10/21/21  6:04 AM   Specimen:  Expectorated Sputum  Result Value Ref Range Status   Specimen Description EXPECTORATED SPUTUM  Final   Special Requests NONE  Final   Sputum evaluation   Final    THIS SPECIMEN IS ACCEPTABLE FOR SPUTUM CULTURE Performed at [facility], [place] [address], [place], [place] [address]    Report Status PENDING  Incomplete  Culture, Respiratory w Gram Stain     Status: None (Preliminary result)   Collection Time: 10/21/21  6:04 AM  Result Value Ref Range Status   Specimen Description EXPECTORATED SPUTUM  Final   Special Requests NONE Reflexed from [id number]  Final   Gram Stain   Final    FEW SQUAMOUS EPITHELIAL CELLS PRESENT FEW WBC SEEN RARE YEAST FEW GRAM POSITIVE COCCI Performed at [facility], [place] [address], [place], [place] [address]    Culture PENDING  Incomplete   Report Status PENDING  Incomplete    Labs: CBC: Recent Labs  Lab 10/18/21 1513 10/18/21 1626 10/19/21 0059 10/20/21 0103 10/21/21 0124 10/22/21 0113  WBC 15.6*  --  10.3 13.7* 16.8* 13.9*  NEUTROABS 14.9*  --   --   --   --  11.1*  HGB 13.9 13.9 11.4* 12.0* 12.3* 12.3*  HCT 42.9 41.0 36.1* 37.0* 38.8* 38.2*  MCV 97.3  --  98.4 99.7 98.2 96.7  PLT 272  --  236 229 273 [id number]   Basic Metabolic Panel: Recent Labs  Lab 10/18/21 1513 10/18/21 1626 10/19/21 0059 10/20/21 0103 10/21/21 0124 10/22/21 0113  NA 146* 144 143 144 142 142  K 4.7 4.5 4.0 4.3 4.0 3.7  CL 104  --  107 109 106 103  CO2 30  --  28 30 30 31   GLUCOSE 147*  --  162* 91 96 94  BUN 41*  --  36* 23 22 12   CREATININE 1.04  --  0.92 0.84 0.76 0.77  CALCIUM 9.1  --  8.1* 8.1* 8.2* 8.2*  MG  --   --   --   --   --  1.5*   Liver Function Tests: No results for input(s): "AST", "ALT", "ALKPHOS", "BILITOT", "PROT", "ALBUMIN" in the last 168 hours. CBG: No results for input(s): "GLUCAP" in the last 168 hours.  Discharge time spent: greater than 30 minutes.  Signed: Berle Mull, MD [facility]

## 2021-10-22 NOTE — TOC Transition Note (Addendum)
Transition of Care [facility]) - CM/SW Discharge Note   Patient Details  Name: Jonathan Harmon MRN: [id number] Date of Birth: July 09, 1933  [facility] CM/SW Contact:  Lynett Grimes Phone Number: 10/22/2021, 12:36 PM   Clinical Narrative:    Patient will DC to: [facility] Anticipated DC date: 10/22/2021 Family notified: Pt son Transport by: Sharin Mons   Per MD patient ready for DC to [facility] rehab room 704p. RN to call report prior to discharge [phone]). RN, patient, patient's family, and facility notified of DC. Discharge Summary and FL2 sent to facility. DC packet on chart. Ambulance transport requested for patient.   CSW will sign off for now as social work intervention is no longer needed. Please consult [name] again if new needs arise.     Final next level of care: Memory Care Barriers to Discharge: Continued Medical Work up   Patient Goals and CMS Choice Patient states their goals for this hospitalization and ongoing recovery are:: Return to [facility] for Memory care      Discharge Placement                       Discharge Plan and Services In-house Referral: Clinical Social Work   Post Acute Care Choice: Long Term Acute Care (LTAC) (Memory Care)                               Social Determinants of Health (SDOH) Interventions     Readmission Risk Interventions     No data to display

## 2021-10-23 LAB — CULTURE, RESPIRATORY W GRAM STAIN

## 2021-10-23 LAB — EXPECTORATED SPUTUM ASSESSMENT W GRAM STAIN, RFLX TO RESP C

## 2021-10-24 MED ORDER — FLUCONAZOLE 150 MG PO TABS
150.0000 mg | ORAL_TABLET | Freq: Once | ORAL | 0 refills | Status: AC
Start: 2021-10-24 — End: 2021-10-24
  Filled 2021-10-24: qty 1, 1d supply, fill #0

## 2021-10-24 NOTE — Addendum Note (Signed)
Addended by: Gwenevere Abbot on: 10/24/2021 02:52 PM   Modules accepted: Orders

## 2021-10-25 ENCOUNTER — Other Ambulatory Visit (HOSPITAL_BASED_OUTPATIENT_CLINIC_OR_DEPARTMENT_OTHER): Payer: Self-pay

## 2021-10-27 ENCOUNTER — Telehealth: Payer: Self-pay | Admitting: Medical

## 2021-10-27 NOTE — Telephone Encounter (Signed)
Spoke with Annice Pih at [facility] stated Jonathan Harmon was busy and she would relay message to call office back in regards to labs

## 2021-10-27 NOTE — Telephone Encounter (Signed)
Pt's son states pt is at [facility] and they would like our office to send recent labs in so they can fill diflucan for him. Please advise. [phone] attn to [place].

## 2021-10-27 NOTE — Telephone Encounter (Signed)
Spoke with [facility] , stated if a script is written Yousof Alderman can get the script under their PA at the facility

## 2021-10-28 ENCOUNTER — Inpatient Hospital Stay: Payer: Medicare Other | Admitting: Nurse Practitioner

## 2021-10-28 NOTE — Telephone Encounter (Signed)
Yes need a paper script for the diflucan so it can be filled within the nursing home pharmacy

## 2021-10-29 MED ORDER — FLUCONAZOLE 150 MG PO TABS: 150.0000 mg | ORAL_TABLET | Freq: Once | ORAL | 0 refills | Status: AC

## 2021-10-29 NOTE — Telephone Encounter (Signed)
Pt's son states his dad is in [facility] right now and currently pretty much bedridden. He states his dad can only walk about 25 feet without getting winded and such and it would be hard for him to bring him in for an OV. Please advise.

## 2021-10-29 NOTE — Telephone Encounter (Signed)
Script sent to nurisng home  Lvm for [name] to call office to schedule a follow up appt for dad

## 2021-11-01 ENCOUNTER — Ambulatory Visit: Payer: Medicare Other | Admitting: Physician Assistant

## 2021-11-01 ENCOUNTER — Other Ambulatory Visit (HOSPITAL_BASED_OUTPATIENT_CLINIC_OR_DEPARTMENT_OTHER): Payer: Self-pay

## 2021-11-01 NOTE — Telephone Encounter (Signed)
Medication sent to [facility] on 10/29/21  Facility does have a PA that does rounds

## 2021-11-04 ENCOUNTER — Other Ambulatory Visit: Payer: Self-pay | Admitting: *Deleted

## 2021-11-04 NOTE — Patient Outreach (Signed)
[facility] Post- Acute Care Coordinator follow up. Per [facility] Jonathan Harmon currently resides in  [facility]. Screened for potential [facility] care management/care coordination needs as a benefit of [employer] plan and PCP.  Facility site visit to [facility] skilled nursing facility. Met with Jonathan Harmon SNF social workers and Jonathan Harmon. [name] is from [place]. Transition plan is to return to Memory Care post SNF.   Will sign off. No identifiable [facility] needs.    Marthenia Rolling, MSN, RN,BSN [place] Acute Care Coordinator [phone] [facility]) [phone]  (Toll free office)

## 2021-11-11 ENCOUNTER — Other Ambulatory Visit: Payer: Self-pay | Admitting: *Deleted

## 2021-11-11 NOTE — Patient Outreach (Signed)
[facility] Post- Acute Care Coordinator follow up. Per [place] eligible member currently resides in [facility].  Screening for potential care coordination/care management services as a benefit of [employer] plan and PCP with [facility].  Facility site visit to [facility] skilled nursing facility. Met with Kathrynn Running, SNF social workers and Verdis Frederickson, Marketing executive. Mr. Peeples reportedly showing decreased motivation. May be due to decreased cognition. Goal is for min assist in order to return to [place].   Will continue to follow.    Marthenia Rolling, MSN, RN,BSN [place] Acute Care Coordinator [phone] [facility]) [phone]  (Toll free office)

## 2021-11-18 ENCOUNTER — Other Ambulatory Visit: Payer: Self-pay | Admitting: *Deleted

## 2021-11-18 NOTE — Patient Outreach (Signed)
[facility] Post- Acute Care Coordinator follow up. Per Bernie eligible member currently  resides in [facility] SN[facility].  Screening for potential [facility] care coordination/care management services as a benefit for Mr. [phone] insurance plan and PCP with [facility].  Facility site visit to [facility] skilled [facility] facility. Met with Kirstin and [place], SNF social workers. Irine Seal reports Mr. Mcnay will transition to [facility] on next Tuesday, August 1st.  Will follow for transition date.    Marthenia Rolling, MSN, RN,BSN [place] Acute Care Coordinator [phone] [facility]) [phone]  (Toll free office)

## 2021-11-25 ENCOUNTER — Other Ambulatory Visit: Payer: Self-pay | Admitting: *Deleted

## 2021-11-25 NOTE — Patient Outreach (Signed)
[facility] Post- Acute Care Coordinator follow up.   Verified with Silvio Clayman, SNF social workers Mr. Terpstra returned to [facility] on Tuesday, August 1st.   No identifiable care coordination/care management needs.    Raiford Noble, MSN, RN,BSN [facility] Post Acute Care Coordinator [phone] [facility]) [phone]  (Toll free office)

## 2021-11-29 ENCOUNTER — Emergency Department (HOSPITAL_COMMUNITY): Payer: Medicare Other

## 2021-11-29 ENCOUNTER — Other Ambulatory Visit: Payer: Self-pay

## 2021-11-29 ENCOUNTER — Inpatient Hospital Stay (HOSPITAL_COMMUNITY)
Admission: EM | Admit: 2021-11-29 | Discharge: 2021-12-03 | DRG: 177 | Disposition: A | Discharge status: Hospice | Payer: Medicare Other | Source: Skilled Nursing Facility | Attending: Internal Medicine | Admitting: Internal Medicine

## 2021-11-29 DIAGNOSIS — Z880 Allergy status to penicillin: Secondary | ICD-10-CM

## 2021-11-29 DIAGNOSIS — F0280 Dementia in other diseases classified elsewhere without behavioral disturbance: Secondary | ICD-10-CM | POA: Diagnosis not present

## 2021-11-29 DIAGNOSIS — E43 Unspecified severe protein-calorie malnutrition: Secondary | ICD-10-CM | POA: Diagnosis present

## 2021-11-29 DIAGNOSIS — E785 Hyperlipidemia, unspecified: Secondary | ICD-10-CM | POA: Diagnosis present

## 2021-11-29 DIAGNOSIS — D539 Nutritional anemia, unspecified: Secondary | ICD-10-CM | POA: Diagnosis present

## 2021-11-29 DIAGNOSIS — J690 Pneumonitis due to inhalation of food and vomit: Principal | ICD-10-CM | POA: Diagnosis present

## 2021-11-29 DIAGNOSIS — Z7951 Long term (current) use of inhaled steroids: Secondary | ICD-10-CM | POA: Diagnosis not present

## 2021-11-29 DIAGNOSIS — J9621 Acute and chronic respiratory failure with hypoxia: Secondary | ICD-10-CM

## 2021-11-29 DIAGNOSIS — Z66 Do not resuscitate: Secondary | ICD-10-CM | POA: Diagnosis present

## 2021-11-29 DIAGNOSIS — R5381 Other malaise: Secondary | ICD-10-CM

## 2021-11-29 DIAGNOSIS — R64 Cachexia: Secondary | ICD-10-CM | POA: Diagnosis present

## 2021-11-29 DIAGNOSIS — Z8701 Personal history of pneumonia (recurrent): Secondary | ICD-10-CM | POA: Diagnosis not present

## 2021-11-29 DIAGNOSIS — L8990 Pressure ulcer of unspecified site, unspecified stage: Secondary | ICD-10-CM | POA: Insufficient documentation

## 2021-11-29 DIAGNOSIS — Z79899 Other long term (current) drug therapy: Secondary | ICD-10-CM | POA: Diagnosis not present

## 2021-11-29 DIAGNOSIS — Z9981 Dependence on supplemental oxygen: Secondary | ICD-10-CM

## 2021-11-29 DIAGNOSIS — Z87891 Personal history of nicotine dependence: Secondary | ICD-10-CM

## 2021-11-29 DIAGNOSIS — J449 Chronic obstructive pulmonary disease, unspecified: Secondary | ICD-10-CM | POA: Diagnosis present

## 2021-11-29 DIAGNOSIS — Z20822 Contact with and (suspected) exposure to covid-19: Secondary | ICD-10-CM | POA: Diagnosis present

## 2021-11-29 DIAGNOSIS — L89151 Pressure ulcer of sacral region, stage 1: Secondary | ICD-10-CM | POA: Diagnosis present

## 2021-11-29 DIAGNOSIS — R627 Adult failure to thrive: Secondary | ICD-10-CM | POA: Diagnosis present

## 2021-11-29 DIAGNOSIS — J84112 Idiopathic pulmonary fibrosis: Secondary | ICD-10-CM | POA: Diagnosis present

## 2021-11-29 DIAGNOSIS — Z8249 Family history of ischemic heart disease and other diseases of the circulatory system: Secondary | ICD-10-CM | POA: Diagnosis not present

## 2021-11-29 DIAGNOSIS — Z7189 Other specified counseling: Secondary | ICD-10-CM | POA: Diagnosis not present

## 2021-11-29 DIAGNOSIS — F0393 Unspecified dementia, unspecified severity, with mood disturbance: Secondary | ICD-10-CM | POA: Diagnosis present

## 2021-11-29 DIAGNOSIS — J441 Chronic obstructive pulmonary disease with (acute) exacerbation: Secondary | ICD-10-CM | POA: Diagnosis present

## 2021-11-29 DIAGNOSIS — J9601 Acute respiratory failure with hypoxia: Principal | ICD-10-CM

## 2021-11-29 DIAGNOSIS — F0390 Unspecified dementia without behavioral disturbance: Secondary | ICD-10-CM | POA: Diagnosis present

## 2021-11-29 DIAGNOSIS — D72829 Elevated white blood cell count, unspecified: Secondary | ICD-10-CM

## 2021-11-29 DIAGNOSIS — Z681 Body mass index (BMI) 19 or less, adult: Secondary | ICD-10-CM | POA: Diagnosis not present

## 2021-11-29 DIAGNOSIS — Z515 Encounter for palliative care: Secondary | ICD-10-CM | POA: Diagnosis not present

## 2021-11-29 DIAGNOSIS — J8489 Other specified interstitial pulmonary diseases: Secondary | ICD-10-CM | POA: Diagnosis present

## 2021-11-29 DIAGNOSIS — E876 Hypokalemia: Secondary | ICD-10-CM

## 2021-11-29 DIAGNOSIS — Z77090 Contact with and (suspected) exposure to asbestos: Secondary | ICD-10-CM | POA: Diagnosis present

## 2021-11-29 DIAGNOSIS — E870 Hyperosmolality and hypernatremia: Secondary | ICD-10-CM | POA: Diagnosis present

## 2021-11-29 DIAGNOSIS — I10 Essential (primary) hypertension: Secondary | ICD-10-CM | POA: Diagnosis present

## 2021-11-29 DIAGNOSIS — R1310 Dysphagia, unspecified: Secondary | ICD-10-CM | POA: Diagnosis present

## 2021-11-29 DIAGNOSIS — G301 Alzheimer's disease with late onset: Secondary | ICD-10-CM | POA: Diagnosis not present

## 2021-11-29 LAB — URINALYSIS, ROUTINE W REFLEX MICROSCOPIC
Bacteria, UA: NONE SEEN
Bilirubin Urine: NEGATIVE
Glucose, UA: NEGATIVE mg/dL
Ketones, ur: 20 mg/dL — AB
Leukocytes,Ua: NEGATIVE
Nitrite: NEGATIVE
Protein, ur: NEGATIVE mg/dL
Specific Gravity, Urine: >1.046 — ABNORMAL HIGH (ref 1.005–1.030)
pH: 5 (ref 5.0–8.0)

## 2021-11-29 LAB — COMPREHENSIVE METABOLIC PANEL
ALT: 16 U/L (ref 0–44)
AST: 25 U/L (ref 15–41)
Albumin: 3 g/dL — ABNORMAL LOW (ref 3.5–5.0)
Alkaline Phosphatase: 90 U/L (ref 38–126)
Anion gap: 12 (ref 5–15)
BUN: 23 mg/dL (ref 8–23)
CO2: 28 mmol/L (ref 22–32)
Calcium: 9 mg/dL (ref 8.9–10.3)
Chloride: 103 mmol/L (ref 98–111)
Creatinine, Ser: 0.81 mg/dL (ref 0.61–1.24)
GFR, Estimated: >60 mL/min (ref >60)
Glucose, Bld: 124 mg/dL — ABNORMAL HIGH (ref 70–99)
Potassium: 4 mmol/L (ref 3.5–5.1)
Sodium: 143 mmol/L (ref 135–145)
Total Bilirubin: 0.7 mg/dL (ref 0.3–1.2)
Total Protein: 8.3 g/dL — ABNORMAL HIGH (ref 6.5–8.1)

## 2021-11-29 LAB — PROTIME-INR
INR: 1.3 — ABNORMAL HIGH (ref 0.8–1.2)
Prothrombin Time: 16.1 seconds — ABNORMAL HIGH (ref 11.4–15.2)

## 2021-11-29 LAB — CBC WITH DIFFERENTIAL/PLATELET
Abs Immature Granulocytes: 0.05 10*3/uL (ref 0.00–0.07)
Basophils Absolute: 0 10*3/uL (ref 0.0–0.1)
Basophils Relative: 0 %
Eosinophils Absolute: 0 10*3/uL (ref 0.0–0.5)
Eosinophils Relative: 0 %
HCT: 42.8 % (ref 39.0–52.0)
Hemoglobin: 13.5 g/dL (ref 13.0–17.0)
Immature Granulocytes: 0 %
Lymphocytes Relative: 12 %
Lymphs Abs: 1.4 10*3/uL (ref 0.7–4.0)
MCH: 31.3 pg (ref 26.0–34.0)
MCHC: 31.5 g/dL (ref 30.0–36.0)
MCV: 99.3 fL (ref 80.0–100.0)
Monocytes Absolute: 0.9 10*3/uL (ref 0.1–1.0)
Monocytes Relative: 8 %
Neutro Abs: 9.1 10*3/uL — ABNORMAL HIGH (ref 1.7–7.7)
Neutrophils Relative %: 80 %
Platelets: 322 10*3/uL (ref 150–400)
RBC: 4.31 MIL/uL (ref 4.22–5.81)
RDW: 14.5 % (ref 11.5–15.5)
WBC: 11.5 10*3/uL — ABNORMAL HIGH (ref 4.0–10.5)
nRBC: 0 % (ref 0.0–0.2)

## 2021-11-29 LAB — LACTIC ACID, PLASMA
Lactic Acid, Venous: 1.7 mmol/L (ref 0.5–1.9)
Lactic Acid, Venous: 1.4 mmol/L (ref 0.5–1.9)

## 2021-11-29 LAB — APTT: aPTT: 35 seconds (ref 24–36)

## 2021-11-29 LAB — TROPONIN I (HIGH SENSITIVITY)
Troponin I (High Sensitivity): 9 ng/L (ref <18)
Troponin I (High Sensitivity): 8 ng/L (ref <18)

## 2021-11-29 LAB — RESP PANEL BY RT-PCR (FLU A&B, COVID) ARPGX2
Influenza A by PCR: NEGATIVE
Influenza B by PCR: NEGATIVE
SARS Coronavirus 2 by RT PCR: NEGATIVE

## 2021-11-29 LAB — BRAIN NATRIURETIC PEPTIDE: B Natriuretic Peptide: 129.4 pg/mL — ABNORMAL HIGH (ref 0.0–100.0)

## 2021-11-29 MED ORDER — SODIUM CHLORIDE 0.9 % IV SOLN
500.0000 mg | Freq: Once | INTRAVENOUS | Status: AC
  Administered 2021-11-29: 500 mg via INTRAVENOUS
  Filled 2021-11-29: qty 5

## 2021-11-29 MED ORDER — METRONIDAZOLE 500 MG/100ML IV SOLN
500.0000 mg | Freq: Two times a day (BID) | INTRAVENOUS | Status: DC
  Administered 2021-11-29 – 2021-12-01 (×4): 500 mg via INTRAVENOUS
  Filled 2021-11-29 (×4): qty 100

## 2021-11-29 MED ORDER — DEXTROSE-NACL 5-0.45 % IV SOLN
INTRAVENOUS | Status: DC
Start: 2021-11-29 — End: 2021-12-01

## 2021-11-29 MED ORDER — GUAIFENESIN ER 600 MG PO TB12
600.0000 mg | ORAL_TABLET | Freq: Two times a day (BID) | ORAL | Status: DC
  Administered 2021-11-30 – 2021-12-03 (×8): 600 mg via ORAL
  Filled 2021-11-29 (×8): qty 1

## 2021-11-29 MED ORDER — SODIUM CHLORIDE 0.9 % IV SOLN
1.0000 g | Freq: Once | INTRAVENOUS | Status: AC
  Administered 2021-11-29: 1 g via INTRAVENOUS
  Filled 2021-11-29: qty 10

## 2021-11-29 MED ORDER — ENOXAPARIN SODIUM 40 MG/0.4ML IJ SOSY
40.0000 mg | PREFILLED_SYRINGE | INTRAMUSCULAR | Status: DC
Start: 2021-11-30 — End: 2021-12-03
  Administered 2021-11-30 – 2021-12-03 (×4): 40 mg via SUBCUTANEOUS
  Filled 2021-11-29 (×4): qty 0.4

## 2021-11-29 MED ORDER — PANTOPRAZOLE SODIUM 40 MG PO TBEC
40.0000 mg | DELAYED_RELEASE_TABLET | Freq: Every day | ORAL | Status: DC
  Administered 2021-11-30 – 2021-12-03 (×4): 40 mg via ORAL
  Filled 2021-11-29 (×4): qty 1

## 2021-11-29 MED ORDER — DONEPEZIL HCL 10 MG PO TABS
10.0000 mg | ORAL_TABLET | Freq: Every day | ORAL | Status: DC
  Administered 2021-11-30 – 2021-12-02 (×4): 10 mg via ORAL
  Filled 2021-11-29 (×4): qty 1

## 2021-11-29 MED ORDER — ADULT MULTIVITAMIN W/MINERALS CH
1.0000 | ORAL_TABLET | Freq: Every day | ORAL | Status: DC
  Administered 2021-11-30 – 2021-12-03 (×4): 1 via ORAL
  Filled 2021-11-29 (×4): qty 1

## 2021-11-29 MED ORDER — SERTRALINE HCL 50 MG PO TABS
50.0000 mg | ORAL_TABLET | Freq: Every day | ORAL | Status: DC
  Administered 2021-11-30 – 2021-12-03 (×4): 50 mg via ORAL
  Filled 2021-11-29 (×4): qty 1

## 2021-11-29 MED ORDER — ONDANSETRON HCL 4 MG/2ML IJ SOLN: 4.0000 mg | Freq: Four times a day (QID) | INTRAMUSCULAR | Status: DC | PRN

## 2021-11-29 MED ORDER — ATORVASTATIN CALCIUM 40 MG PO TABS
40.0000 mg | ORAL_TABLET | Freq: Every day | ORAL | Status: DC
  Administered 2021-11-30 – 2021-12-02 (×4): 40 mg via ORAL
  Filled 2021-11-29 (×4): qty 1

## 2021-11-29 MED ORDER — IOHEXOL 300 MG/ML  SOLN
75.0000 mL | Freq: Once | INTRAMUSCULAR | Status: AC | PRN
  Administered 2021-11-29: 75 mL via INTRAVENOUS

## 2021-11-29 MED ORDER — SODIUM CHLORIDE (PF) 0.9 % IJ SOLN
INTRAMUSCULAR | Status: AC
  Filled 2021-11-29: qty 50

## 2021-11-29 MED ORDER — ONDANSETRON HCL 4 MG PO TABS: 4.0000 mg | ORAL_TABLET | Freq: Four times a day (QID) | ORAL | Status: DC | PRN

## 2021-11-29 MED ORDER — DIVALPROEX SODIUM 125 MG PO CSDR
125.0000 mg | DELAYED_RELEASE_CAPSULE | Freq: Two times a day (BID) | ORAL | Status: DC
  Administered 2021-11-30 – 2021-12-03 (×8): 125 mg via ORAL
  Filled 2021-11-29 (×8): qty 1

## 2021-11-29 MED ORDER — SODIUM CHLORIDE 0.9 % IV SOLN
2.0000 g | INTRAVENOUS | Status: DC
  Administered 2021-11-30 – 2021-12-03 (×4): 2 g via INTRAVENOUS
  Filled 2021-11-29 (×4): qty 20

## 2021-11-29 MED ORDER — IPRATROPIUM-ALBUTEROL 0.5-2.5 (3) MG/3ML IN SOLN
3.0000 mL | Freq: Four times a day (QID) | RESPIRATORY_TRACT | Status: DC
Start: 2021-11-30 — End: 2021-11-30
  Administered 2021-11-30 (×2): 3 mL via RESPIRATORY_TRACT
  Filled 2021-11-29 (×2): qty 3

## 2021-11-29 MED ORDER — SODIUM CHLORIDE 0.9 % IV SOLN
500.0000 mg | INTRAVENOUS | Status: DC
  Administered 2021-11-30 – 2021-12-01 (×2): 500 mg via INTRAVENOUS
  Filled 2021-11-29 (×3): qty 5

## 2021-11-29 MED ORDER — ACETAMINOPHEN 650 MG RE SUPP: 650.0000 mg | Freq: Four times a day (QID) | RECTAL | Status: DC | PRN

## 2021-11-29 MED ORDER — ACETAMINOPHEN 325 MG PO TABS: 650.0000 mg | ORAL_TABLET | Freq: Four times a day (QID) | ORAL | Status: DC | PRN

## 2021-11-29 NOTE — ED Triage Notes (Signed)
Pt. BIB [facility] from [facility]. Per EMS Pt. Was pre-diagnosed with pneumonia. This morning Physical Therapy got him up to walk, during this time the patient de-sated to 70% on his [place] @ 2.5L and the physical therapist then called EMS.

## 2021-11-29 NOTE — H&P (Signed)
History and Physical    Patient: Jonathan Harmon ATF:[id number] DOB: [birth year] DOA: 11/29/2021 DOS: the patient was seen and examined on 11/29/2021 PCP: Esperanza Richters, PA-C  Patient coming from: SNF  Chief Complaint:  Chief Complaint  Patient presents with   Respiratory Distress   HPI: Jonathan Harmon is a 88 y.o. male with medical history significant of COPD, chronic hypoxic respiratory failure on 2.5L, dementia. Presenting with hypoxia. History is from chart review as patient is pleasantly demented and there is no family available at this time. He was at his memory care facility working with physical therapy this morning when it was noted that he was short of breath. His pulse ox was taken and he was noted to have desaturated to 70% with minimal ambulation. EMS was called and the patient was brought to the ED for evaluation.   Review of Systems: As mentioned in the history of present illness. All other systems reviewed and are negative. Past Medical History:  Diagnosis Date   Asbestos exposure    diminished lung compacity from asbestos exposure 30 years   COPD (chronic obstructive pulmonary disease) (HCC)    Hyperlipidemia    Hypertension    Pneumonia    x 5   Past Surgical History:  Procedure Laterality Date   APPENDECTOMY     SHOULDER SURGERY Bilateral    x 5 ( 2 on right ; 3 on left)   Social History:  reports that he quit smoking about 21 years ago. His smoking use included cigarettes. He has a 40.00 pack-year smoking history. He has never used smokeless tobacco. He reports that he does not drink alcohol and does not use drugs.  Allergies  Allergen Reactions   Penicillin G Swelling   Penicillins Rash    Reported to family member - occurred when he was in his 59s     Family History  Problem Relation Age of Onset   Heart attack Father     Prior to Admission medications   Medication Sig Start Date End Date Taking? Authorizing Provider  albuterol (VENTOLIN HFA) 108 (90  Base) MCG/ACT inhaler inhale 2 puffs by mouth into the lungs every 6 hours as needed for wheezing and shortness of breath Patient taking differently: Inhale 2 puffs into the lungs in the morning and at bedtime. 08/24/20 10/18/21  Saguier, Ramon Dredge, PA-C  atorvastatin (LIPITOR) 40 MG tablet TAKE 1 TABLET BY MOUTH ONCE DAILY Patient not taking: Reported on 10/14/2021 09/03/19 05/10/21  Saguier, Ramon Dredge, PA-C  divalproex (DEPAKOTE SPRINKLE) 125 MG capsule Take 125 mg by mouth 2 (two) times daily.    [provider]  donepezil (ARICEPT) 10 MG tablet Take 1 tablet (10 mg total) by mouth daily. Patient not taking: Reported on 10/14/2021 05/10/21   Marcos Eke, PA-C  feeding supplement (ENSURE ENLIVE / ENSURE PLUS) LIQD Take 237 mLs by mouth 3 (three) times daily between meals. 10/22/21   Rolly Salter, MD  fluticasone Aleda Grana) 50 MCG/ACT nasal spray Place 2 sprays into both nostrils daily. 10/23/21   Rolly Salter, MD  guaiFENesin (MUCINEX) 600 MG 12 hr tablet Take 600 mg by mouth 2 (two) times daily.    [provider]  ipratropium-albuterol (DUONEB) 0.5-2.5 (3) MG/3ML SOLN Take 3 mLs by nebulization every 4 (four) hours as needed. 10/22/21   Rolly Salter, MD  multivitamin (ONE-A-DAY MEN'S) TABS tablet Take 1 tablet by mouth daily. 10/22/21   Rolly Salter, MD  pantoprazole (PROTONIX) 40 MG tablet Take 1  tablet (40 mg total) by mouth daily at 6 (six) AM. 10/23/21   Rolly Salter, MD  predniSONE (DELTASONE) 10 MG tablet Take 5 tabs by mouth daily for 3 days,Take 4 tabs by mouth daily for 3 days,Take 3 tabs daily for 3 days,Take 2 tabs daily for 3 days,Take 1 tab daily for 3 days, then stop 10/22/21   Rolly Salter, MD  sertraline (ZOLOFT) 50 MG tablet Take 50 mg by mouth daily.    [provider]  Tiotropium Bromide-Olodaterol (STIOLTO RESPIMAT) 2.5-2.5 MCG/ACT AERS Inhale 1 puff into the lungs daily. Patient taking differently: Inhale 2 puffs into the lungs daily. 10/08/20    Leslye Peer, MD    Physical Exam: Vitals:   11/29/21 1315 11/29/21 1330 11/29/21 1400 11/29/21 1415  BP: 118/66 118/63 124/69 126/74  Pulse: 95 92 95 90  Resp: (!) 33 (!) 25 (!) 23 (!) 25  Temp:      TempSrc:      SpO2: 97% 99% 100% 100%   General: 88 y.o. chronically ill appearing male resting in bed Eyes: PERRL, normal sclera ENMT: Nares patent w/o discharge, orophaynx clear, dentition normal, ears w/o discharge/lesions/ulcers Neck: Supple, trachea midline Cardiovascular: RRR, +S1, S2, no m/g/r, equal pulses throughout Respiratory: scattered rhonchi and some wheeze, increased WOB on 5L GI: BS+, NDNT, no masses noted, no organomegaly noted MSK: No e/c/c, cachetic Neuro: A&O x 2, no focal deficits Psyc: Appropriate interaction and affect, calm/cooperative  Data Reviewed:  Lab Results  Component Value Date   NA 143 11/29/2021   K 4.0 11/29/2021   CO2 28 11/29/2021   GLUCOSE 124 (H) 11/29/2021   BUN 23 11/29/2021   CREATININE 0.81 11/29/2021   CALCIUM 9.0 11/29/2021   GFRNONAA >60 11/29/2021   Lab Results  Component Value Date   ALT 16 11/29/2021   AST 25 11/29/2021   ALKPHOS 90 11/29/2021   BILITOT 0.7 11/29/2021   Lab Results  Component Value Date   WBC 11.5 (H) 11/29/2021   HGB 13.5 11/29/2021   HCT 42.8 11/29/2021   MCV 99.3 11/29/2021   PLT 322 11/29/2021   CTA Chest IMPRESSION: 1.  No evidence of pulmonary embolism. 2. Similar right and increased left lower lobe dependent airspace disease. Residual/recurrent aspiration versus infection. 3. Esophageal air fluid level suggests dysmotility or gastroesophageal reflux. Recommend clinical exclusion of aspiration. 4. Aortic atherosclerosis (ICD10-I70.0), coronary artery atherosclerosis and emphysema (ICD10-J43.9).  Assessment and Plan: Acute on chronic hypoxic respiratory failure; on 2.5L at baseline Possible aspiration PNA     - admit to inpt, tele     - continue abx, add nebs     - SLP swallow  eval; NPO except sip/meds until then     - check procal, urine legionella/strep     - fluids  COPD Hx of UIP     - continue home regimen in addition to treatment as above  Dementia     - continue home regimen  Severe protein calorie malnutrition     - dietitian consult  Goals of care     - palliative care consult  Advance Care Planning:   Code Status: DNR, yellow form  Consults: None  Family Communication: attempted call to both numbers listed as contacts in the chart, received VM only.  Severity of Illness: The appropriate patient status for this patient is INPATIENT. Inpatient status is judged to be reasonable and necessary in order to provide the required intensity of service to ensure the patient's  safety. The patient's presenting symptoms, physical exam findings, and initial radiographic and laboratory data in the context of their chronic comorbidities is felt to place them at high risk for further clinical deterioration. Furthermore, it is not anticipated that the patient will be medically stable for discharge from the hospital within 2 midnights of admission.   * I certify that at the point of admission it is my clinical judgment that the patient will require inpatient hospital care spanning beyond 2 midnights from the point of admission due to high intensity of service, high risk for further deterioration and high frequency of surveillance required.*  Author: Teddy Spike, DO 11/29/2021 2:38 PM  For on call review [email or web address].

## 2021-11-29 NOTE — ED Provider Notes (Signed)
[facility]-EMERGENCY DEPT Provider Note   CSN: [id number] Arrival date & time: 11/29/21  1102     History  Chief Complaint  Patient presents with   Respiratory Distress    Jonathan Harmon is a 88 y.o. male.  Patient is an 88 year old male who presents via EMS for respiratory distress.  He on chart review, he has a history of COPD, hypertension, hyperlipidemia and dementia.  He is currently residing at [facility] nursing facility.  He was admitted in June for pneumonia and respiratory failure.  Since that time he has been on oxygen by nasal cannula at 2-1/2 L.  Per EMS, today he was noted to have oxygen desaturation to 70% on his baseline oxygen with minimal ambulation.  This was noted by the physical therapist on scene.  EMS was called.  Patient was given nebulizer treatments as well as magnesium and Solu-Medrol.  Patient denies any known fevers.  No chest pain.  He says his cough is productive of some yellow and white sputum.       Home Medications Prior to Admission medications   Medication Sig Start Date End Date Taking? Authorizing Provider  atorvastatin (LIPITOR) 40 MG tablet TAKE 1 TABLET BY MOUTH ONCE DAILY Patient taking differently: Take 40 mg by mouth at bedtime. 09/03/19 11/29/21 Yes Saguier, Ramon Dredge, PA-C  divalproex (DEPAKOTE SPRINKLE) 125 MG capsule Take 125 mg by mouth 2 (two) times daily.   Yes [provider]  donepezil (ARICEPT) 10 MG tablet Take 1 tablet (10 mg total) by mouth daily. Patient taking differently: Take 10 mg by mouth at bedtime. 05/10/21  Yes Marcos Eke, PA-C  albuterol (VENTOLIN HFA) 108 (90 Base) MCG/ACT inhaler inhale 2 puffs by mouth into the lungs every 6 hours as needed for wheezing and shortness of breath Patient taking differently: Inhale 2 puffs into the lungs in the morning and at bedtime. 08/24/20 10/18/21  Saguier, Ramon Dredge, PA-C  feeding supplement (ENSURE ENLIVE / ENSURE PLUS) LIQD Take 237 mLs by mouth 3 (three)  times daily between meals. 10/22/21   Rolly Salter, MD  fluticasone Aleda Grana) 50 MCG/ACT nasal spray Place 2 sprays into both nostrils daily. 10/23/21   Rolly Salter, MD  guaiFENesin (MUCINEX) 600 MG 12 hr tablet Take 600 mg by mouth 2 (two) times daily.    [provider]  ipratropium-albuterol (DUONEB) 0.5-2.5 (3) MG/3ML SOLN Take 3 mLs by nebulization every 4 (four) hours as needed. 10/22/21   Rolly Salter, MD  multivitamin (ONE-A-DAY MEN'S) TABS tablet Take 1 tablet by mouth daily. 10/22/21   Rolly Salter, MD  pantoprazole (PROTONIX) 40 MG tablet Take 1 tablet (40 mg total) by mouth daily at 6 (six) AM. 10/23/21   Rolly Salter, MD  predniSONE (DELTASONE) 10 MG tablet Take 5 tabs by mouth daily for 3 days,Take 4 tabs by mouth daily for 3 days,Take 3 tabs daily for 3 days,Take 2 tabs daily for 3 days,Take 1 tab daily for 3 days, then stop 10/22/21   Rolly Salter, MD  sertraline (ZOLOFT) 50 MG tablet Take 50 mg by mouth daily.    [provider]  Tiotropium Bromide-Olodaterol (STIOLTO RESPIMAT) 2.5-2.5 MCG/ACT AERS Inhale 1 puff into the lungs daily. Patient taking differently: Inhale 2 puffs into the lungs daily. 10/08/20   Leslye Peer, MD      Allergies    Penicillins    Review of Systems   Review of Systems  Constitutional:  Positive for  fatigue. Negative for chills, diaphoresis and fever.  HENT:  Negative for congestion, rhinorrhea and sneezing.   Eyes: Negative.   Respiratory:  Positive for cough and shortness of breath. Negative for chest tightness.   Cardiovascular:  Negative for chest pain and leg swelling.  Gastrointestinal:  Negative for abdominal pain, blood in stool, diarrhea, nausea and vomiting.  Genitourinary:  Negative for difficulty urinating, flank pain, frequency and hematuria.  Musculoskeletal:  Negative for arthralgias and back pain.  Skin:  Negative for rash.  Neurological:  Negative for dizziness, speech difficulty, weakness, numbness  and headaches.    Physical Exam Updated Vital Signs BP 139/70   Pulse 87   Temp 98.7 F (37.1 C) (Rectal)   Resp (!) 29   SpO2 100%  Physical Exam Constitutional:      General: He is in acute distress.     Appearance: He is well-developed.  HENT:     Head: Normocephalic and atraumatic.  Eyes:     Pupils: Pupils are equal, round, and reactive to light.  Cardiovascular:     Rate and Rhythm: Normal rate and regular rhythm.     Heart sounds: Normal heart sounds.  Pulmonary:     Effort: Respiratory distress present.     Breath sounds: Rhonchi and rales present. No wheezing.     Comments: Scattered rhonchi and crackles bilaterally Chest:     Chest wall: No tenderness.  Abdominal:     General: Bowel sounds are normal.     Palpations: Abdomen is soft.     Tenderness: There is no abdominal tenderness. There is no guarding or rebound.  Musculoskeletal:        General: Normal range of motion.     Cervical back: Normal range of motion and neck supple.     Comments: No edema or calf tenderness  Lymphadenopathy:     Cervical: No cervical adenopathy.  Skin:    General: Skin is warm and dry.     Findings: No rash.  Neurological:     Mental Status: He is alert.     Comments: Oriented to person, he knows that he is in a healthcare facility but does not know the name, he does not know the month or year.  He is awake and alert and moving all extremities symmetrically without obvious focal deficits     ED Results / Procedures / Treatments   Labs (all labs ordered are listed, but only abnormal results are displayed) Labs Reviewed  COMPREHENSIVE METABOLIC PANEL - Abnormal; Notable for the following components:      Result Value   Glucose, Bld 124 (*)    Total Protein 8.3 (*)    Albumin 3.0 (*)    All other components within normal limits  CBC WITH DIFFERENTIAL/PLATELET - Abnormal; Notable for the following components:   WBC 11.5 (*)    Neutro Abs 9.1 (*)    All other components  within normal limits  PROTIME-INR - Abnormal; Notable for the following components:   Prothrombin Time 16.1 (*)    INR 1.3 (*)    All other components within normal limits  BRAIN NATRIURETIC PEPTIDE - Abnormal; Notable for the following components:   B Natriuretic Peptide 129.4 (*)    All other components within normal limits  RESP PANEL BY RT-PCR (FLU A&B, COVID) ARPGX2  CULTURE, BLOOD (ROUTINE X 2)  CULTURE, BLOOD (ROUTINE X 2)  URINE CULTURE  LACTIC ACID, PLASMA  APTT  LACTIC ACID, PLASMA  URINALYSIS, ROUTINE W  REFLEX MICROSCOPIC  TROPONIN I (HIGH SENSITIVITY)  TROPONIN I (HIGH SENSITIVITY)    EKG EKG Interpretation  Date/Time:  Monday November 29 2021 11:46:13 EDT Ventricular Rate:  110 PR Interval:  166 QRS Duration: 88 QT Interval:  390 QTC Calculation: 528 R Axis:   94 Text Interpretation: Sinus tachycardia Probable left atrial enlargement Right axis deviation Anteroseptal infarct, age indeterminate Prolonged QT interval Confirmed by Rolan Bucco [phone]) on 11/29/2021 1:01:16 PM  Radiology CT Angio Chest PE W/Cm &/Or Wo Cm  Result Date: 11/29/2021 CLINICAL DATA:  Oxygen desaturation. Recently diagnosed with pneumonia. EXAM: CT ANGIOGRAPHY CHEST WITH CONTRAST TECHNIQUE: Multidetector CT imaging of the chest was performed using the standard protocol during bolus administration of intravenous contrast. Multiplanar CT image reconstructions and MIPs were obtained to evaluate the vascular anatomy. RADIATION DOSE REDUCTION: This exam was performed according to the departmental dose-optimization program which includes automated exposure control, adjustment of the mA and/or kV according to patient size and/or use of iterative reconstruction technique. CONTRAST:  74mL OMNIPAQUE IOHEXOL 300 MG/ML  SOLN COMPARISON:  Plain films of earlier today.  CT of 10/19/2018 FINDINGS: Cardiovascular: The quality of this exam for evaluation of pulmonary embolism is good. No evidence of pulmonary  embolism. Aortic atherosclerosis. Normal heart size, without pericardial effusion. Multivessel coronary artery atherosclerosis. Mediastinum/Nodes: No mediastinal or hilar adenopathy. Upper esophageal fluid level and dilatation including on 60/4. Lungs/Pleura: Bilateral calcified pleural plaques. No pleural fluid. Advanced bullous emphysema. Mild bronchiectasis in both lower lobes. Apparent posterior left upper lobe nodularity on 56/10 at 4 mm is new since 10/18/2021, favoring a focus of atelectasis or less likely infection. Increase in left and similar right lower lobe dependent airspace disease. Upper Abdomen: Mild motion degradation, primarily involving the lower chest and upper abdomen. Normal imaged portions of the liver, spleen, stomach, pancreas, adrenal glands, right kidney. Too small to characterize left renal lesions are most likely cysts . In the absence of clinically indicated signs/symptoms require(s) no independent follow-up. Musculoskeletal: No acute osseous abnormality. Review of the MIP images confirms the above findings. IMPRESSION: 1.  No evidence of pulmonary embolism. 2. Similar right and increased left lower lobe dependent airspace disease. Residual/recurrent aspiration versus infection. 3. Esophageal air fluid level suggests dysmotility or gastroesophageal reflux. Recommend clinical exclusion of aspiration. 4. Aortic atherosclerosis (ICD10-I70.0), coronary artery atherosclerosis and emphysema (ICD10-J43.9). Electronically Signed   By: Jeronimo Greaves M.D.   On: 11/29/2021 14:06   DG Chest Port 1 View  Result Date: 11/29/2021 CLINICAL DATA:  Shortness of breath. EXAM: PORTABLE CHEST 1 VIEW COMPARISON:  Chest radiograph October 21, 2021 FINDINGS: Monitoring leads overlie the patient. Stable cardiac and mediastinal contours. Redemonstrated extensive calcified pleural plaques bilaterally. No definite evidence for acute superimposed process. IMPRESSION: No definite evidence for acute superimposed  process on a background of extensive calcified pleural plaques. Electronically Signed   By: Annia Belt M.D.   On: 11/29/2021 12:18    Procedures Procedures    Medications Ordered in ED Medications  sodium chloride (PF) 0.9 % injection (has no administration in time range)  cefTRIAXone (ROCEPHIN) 1 g in sodium chloride 0.9 % 100 mL IVPB (1 g Intravenous New Bag/Given 11/29/21 1441)  azithromycin (ZITHROMAX) 500 mg in sodium chloride 0.9 % 250 mL IVPB (500 mg Intravenous New Bag/Given 11/29/21 1446)  metroNIDAZOLE (FLAGYL) IVPB 500 mg (500 mg Intravenous New Bag/Given 11/29/21 1437)  iohexol (OMNIPAQUE) 300 MG/ML solution 75 mL (75 mLs Intravenous Contrast Given 11/29/21 1334)    ED Course/ Medical  Decision Making/ A&P                           Medical Decision Making Amount and/or Complexity of Data Reviewed Labs: ordered. Radiology: ordered. ECG/medicine tests: ordered.  Risk Prescription drug management. Decision regarding hospitalization.   Patient is a 88 year old male who presents with respiratory distress.  He was treated with DuoNebs in the field as well as magnesium and Solu-Medrol.  He was transition to a nasal cannula on arrival to the ED.  He was requiring 5 L/min of oxygen.  His work of breathing improved.  He had a chest x-ray which was interpreted by me and confirmed by the radiologist to show no definitive pneumonia.  No pulmonary edema.  His COVID/flu test was negative.  His BNP is only mildly elevated and he does not have other suggestions of fluid overload.  Given his hypoxemia, CT scan was performed which shows no evidence of PE.  There does appear to be infiltrates with suspicion of aspiration.  He was started on IV antibiotics including Rocephin, Zithromax and Flagyl.  I spoke with Dr. Ronaldo Miyamoto who will admit the patient for further treatment.  CRITICAL CARE Performed by: Rolan Bucco Total critical care time: 60 minutes Critical care time was exclusive of separately  billable procedures and treating other patients. Critical care was necessary to treat or prevent imminent or life-threatening deterioration. Critical care was time spent personally by me on the following activities: development of treatment plan with patient and/or surrogate as well as nursing, discussions with consultants, evaluation of patient's response to treatment, examination of patient, obtaining history from patient or surrogate, ordering and performing treatments and interventions, ordering and review of laboratory studies, ordering and review of radiographic studies, pulse oximetry and re-evaluation of patient's condition.   Final Clinical Impression(s) / ED Diagnoses Final diagnoses:  Acute respiratory failure with hypoxia (HCC)  Aspiration pneumonia of both lungs, unspecified aspiration pneumonia type, unspecified part of lung [facility])    Rx / DC Orders ED Discharge Orders     None         Rolan Bucco, MD 11/29/21 1500

## 2021-11-30 DIAGNOSIS — J690 Pneumonitis due to inhalation of food and vomit: Secondary | ICD-10-CM

## 2021-11-30 DIAGNOSIS — L8990 Pressure ulcer of unspecified site, unspecified stage: Secondary | ICD-10-CM | POA: Insufficient documentation

## 2021-11-30 LAB — COMPREHENSIVE METABOLIC PANEL
ALT: 13 U/L (ref 0–44)
AST: 20 U/L (ref 15–41)
Albumin: 2.5 g/dL — ABNORMAL LOW (ref 3.5–5.0)
Alkaline Phosphatase: 76 U/L (ref 38–126)
Anion gap: 9 (ref 5–15)
BUN: 23 mg/dL (ref 8–23)
CO2: 29 mmol/L (ref 22–32)
Calcium: 8.6 mg/dL — ABNORMAL LOW (ref 8.9–10.3)
Chloride: 107 mmol/L (ref 98–111)
Creatinine, Ser: 0.82 mg/dL (ref 0.61–1.24)
GFR, Estimated: >60 mL/min (ref >60)
Glucose, Bld: 139 mg/dL — ABNORMAL HIGH (ref 70–99)
Potassium: 3.8 mmol/L (ref 3.5–5.1)
Sodium: 145 mmol/L (ref 135–145)
Total Bilirubin: 0.6 mg/dL (ref 0.3–1.2)
Total Protein: 6.9 g/dL (ref 6.5–8.1)

## 2021-11-30 LAB — PROCALCITONIN: Procalcitonin: 1.66 ng/mL

## 2021-11-30 LAB — URINE CULTURE

## 2021-11-30 LAB — CBC
HCT: 33.6 % — ABNORMAL LOW (ref 39.0–52.0)
Hemoglobin: 10.7 g/dL — ABNORMAL LOW (ref 13.0–17.0)
MCH: 31.7 pg (ref 26.0–34.0)
MCHC: 31.8 g/dL (ref 30.0–36.0)
MCV: 99.4 fL (ref 80.0–100.0)
Platelets: 253 10*3/uL (ref 150–400)
RBC: 3.38 MIL/uL — ABNORMAL LOW (ref 4.22–5.81)
RDW: 14.6 % (ref 11.5–15.5)
WBC: 9.2 10*3/uL (ref 4.0–10.5)
nRBC: 0 % (ref 0.0–0.2)

## 2021-11-30 LAB — STREP PNEUMONIAE URINARY ANTIGEN: Strep Pneumo Urinary Antigen: NEGATIVE

## 2021-11-30 MED ORDER — IPRATROPIUM-ALBUTEROL 0.5-2.5 (3) MG/3ML IN SOLN
3.0000 mL | Freq: Two times a day (BID) | RESPIRATORY_TRACT | Status: DC
  Administered 2021-11-30 – 2021-12-03 (×6): 3 mL via RESPIRATORY_TRACT
  Filled 2021-11-30 (×6): qty 3

## 2021-11-30 NOTE — Consult Note (Signed)
WOC Nurse wound consult note Consultation was completed by review of records, images and assistance from the bedside nurse/clinical staff.  Reason for Consult: Stage 1 Pressure Injury: sacrum  Wound type: Stage 1 PI; sacrum  Pressure Injury POA: Yes Measurement:see nursing flow sheet Wound bed: non blanchable redness Drainage (amount, consistency, odor) none Periwound: intact  Dressing procedure/placement/frequency:  Continue silicone foam per skin care nursing order set.   Discussed POC with bedside nurse.  Re consult if needed, will not follow at this time. Thanks  Aariah Godette [employer], RN,CWOCN, CNS, CWON-AP [phone])

## 2021-11-30 NOTE — Progress Notes (Signed)
Initial Nutrition Assessment  DOCUMENTATION CODES:   Severe malnutrition in context of chronic illness  INTERVENTION:   Once diet advanced: -Ensure Enlive po BID, each supplement provides 350 kcal and 20 grams of protein.  NUTRITION DIAGNOSIS:   Severe Malnutrition related to chronic illness, dysphagia (dementia) as evidenced by severe fat depletion, severe muscle depletion.  GOAL:   Patient will meet greater than or equal to 90% of their needs  MONITOR:   PO intake, Diet advancement, Weight trends, Labs, I & O's  REASON FOR ASSESSMENT:   Consult Assessment of nutrition requirement/status  ASSESSMENT:   88 year old male with COPD, chronic hypoxic respiratory failure on 2.5 L at home, dementia comes into the hospital with worsening hypoxia.  He was at his memory care facility, working with therapy when he was short of breath and found to have sats in the 70s with minimal ambulation. Admitted for aspiration pneumonia.  Patient sleeping in bed, no family at bedside at time of visit. Pt with dementia, alert/oriented x 1. Currently NPO, SLP to evaluate. Once/if diet advanced, will order Ensure supplements given malnutrition.  Per weight records, no weight for this admission. Last recorded weight is 112 lbs on 6/21. Have placed order for weight to be obtained.  Medications: Multivitamin with minerals daily, D5 infusion  Labs reviewed.  NUTRITION - FOCUSED PHYSICAL EXAM:  Flowsheet Row Most Recent Value  Orbital Region Severe depletion  Upper Arm Region Severe depletion  Thoracic and Lumbar Region Severe depletion  Buccal Region Severe depletion  Temple Region Severe depletion  Clavicle Bone Region Severe depletion  Clavicle and Acromion Bone Region Severe depletion  Scapular Bone Region Severe depletion  Dorsal Hand Severe depletion  Patellar Region Severe depletion  Anterior Thigh Region Severe depletion  Posterior Calf Region Severe depletion  Edema (RD  Assessment) None  Hair Reviewed  Eyes Reviewed  Mouth Reviewed  Skin Reviewed  Nails Reviewed       Diet Order:   Diet Order             Diet NPO time specified Except for: Sips with Meds  Diet effective now                   EDUCATION NEEDS:   Not appropriate for education at this time  Skin:  Skin Assessment: Skin Integrity Issues: Skin Integrity Issues:: Stage I Stage I: sacrum  Last BM:  8/7  Height:   Ht Readings from Last 1 Encounters:  10/18/21 5\' 8"  (1.727 m)    Weight:   Wt Readings from Last 1 Encounters:  10/13/21 51 kg    BMI:  There is no height or weight on file to calculate BMI.  Estimated Nutritional Needs:   Kcal:  1900-2100  Protein:  85-100g  Fluid:  2L/day  10/15/21, MS, RD, LDN Inpatient Clinical Dietitian Contact information available via Amion

## 2021-11-30 NOTE — Evaluation (Addendum)
Clinical/Bedside Swallow Evaluation Patient Details  Name: Jonathan Harmon MRN: [id number] Date of Birth: 1933-12-10  Today's Date: 11/30/2021 Time: SLP Start Time (ACUTE ONLY): 1915 SLP Stop Time (ACUTE ONLY): 1940 SLP Time Calculation (min) (ACUTE ONLY): 25 min  Past Medical History:  Past Medical History:  Diagnosis Date   Asbestos exposure    diminished lung compacity from asbestos exposure 30 years   COPD (chronic obstructive pulmonary disease) (HCC)    Hyperlipidemia    Hypertension    Pneumonia    x 5   Past Surgical History:  Past Surgical History:  Procedure Laterality Date   APPENDECTOMY     SHOULDER SURGERY Bilateral    x 5 ( 2 on right ; 3 on left)   HPI:  88 year old male with COPD, chronic hypoxic respiratory failure on 2.5 L at home, dementia comes into the hospital with worsening hypoxia.  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  He was at his memory care facility, working with therapy when he was short of breath and found to have sats in the 70s with minimal ambulation. Admitted for aspiration pneumonia.  CT chest showed " Similar right and increased left lower lobe dependent airspace  disease. Residual/recurrent aspiration versus infection.  3. Esophageal air fluid level suggests dysmotility or  gastroesophageal reflux. Recommend clinical exclusion of aspiration."  Pt has h/o falls.  He is a DNR  Medication list includes Depakote, protonix.  Prior BSE evaluation done 09/26/2021 recommend reg/thin diet and pt without indications of dysphagia at that time.    Assessment / Plan / Recommendation  Clinical Impression  Pt awake in bed, friendly and participative.   No focal CN deficits present - viscous secretions - whitish in oral cavity and clearing with minimal intake.  Pt demonstrates immediate and delayed congested coughing post- delayed swallow of nectar and thin via cup. Cough was productive x1 with pt expectorating  viscous secretions.   No indication of aspiration or significant dysphagia with applesauce nor graham cracker boluses.  Pt consumed 4 ounces of applesauce and advised he would "eat more".  Imaging of chest showed air fluid line in esophagus concerning for esophageal dysmotility and suspect potential pharyngeal deficit as well.     Recommend dys3/thin - thin via tsp at this time and SLP will follow up next date for readiness for advancement in diet and po tolerance.   Educated pt to recommendations/concerns - and he stated " I just want to drink the liquids" - however when questions if coughing made him uncomfortable, he conceded.  No family present at this time.   Posted swallow precaution signs in pt's room. SLP Visit Diagnosis: Dysphagia, unspecified (R13.10)    Aspiration Risk  Moderate aspiration risk    Diet Recommendation Dysphagia 3 (Mech soft);Thin liquid   Liquid Administration via: Spoon Medication Administration: Whole meds with puree Supervision: Full supervision/cueing for compensatory strategies Compensations: Slow rate;Small sips/bites;Other (Comment) Postural Changes: Seated upright at 90 degrees;Remain upright for at least 30 minutes after po intake    Other  Recommendations Oral Care Recommendations: Oral care BID    Recommendations for follow up therapy are one component of a multi-disciplinary discharge planning process, led by the attending physician.  Recommendations may be updated based on patient status, additional functional criteria and insurance authorization.  Follow up Recommendations Follow physician's recommendations for discharge plan and follow up therapies      Assistance Recommended at Discharge Frequent or constant Supervision/Assistance  Functional Status Assessment Patient has had a recent decline in their functional status and demonstrates the ability to make significant improvements in function in a reasonable and predictable amount of time.   Frequency and Duration min 2x/week  1 week       Prognosis Prognosis for Safe Diet Advancement: Good      Swallow Study   General Date of Onset: 11/30/21 HPI: 88 year old male with COPD, chronic hypoxic respiratory failure on 2.5 L at home, dementia comes into the hospital with worsening hypoxia.  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  He was at his memory care facility, working with therapy when he was short of breath and found to have sats in the 70s with minimal ambulation. Admitted for aspiration pneumonia.  CT chest showed " Similar right and increased left lower lobe dependent airspace  disease. Residual/recurrent aspiration versus infection.  3. Esophageal air fluid level suggests dysmotility or  gastroesophageal reflux. Recommend clinical exclusion of aspiration."  Pt has h/o falls.  He is a DNR  Medication list includes Depakote, protonix. Type of Study: Bedside Swallow Evaluation Previous Swallow Assessment: none in the system Diet Prior to this Study: NPO (x medications) Temperature Spikes Noted: No Respiratory Status: Nasal cannula History of Recent Intubation: No Behavior/Cognition: Alert Oral Cavity Assessment: Within Functional Limits Oral Care Completed by SLP: No Oral Cavity - Dentition: Dentures, top;Adequate natural dentition Vision: Functional for self-feeding Self-Feeding Abilities: Able to feed self;Needs set up Patient Positioning: Upright in bed Baseline Vocal Quality: Wet;Low vocal intensity Volitional Cough: Weak;Congested Volitional Swallow: Unable to elicit    Oral/Motor/Sensory Function Overall Oral Motor/Sensory Function: Generalized oral weakness   Ice Chips Ice chips: Not tested Other Comments: pt does not like ice   Thin Liquid Thin Liquid: Impaired Presentation: Cup;Spoon Pharyngeal  Phase Impairments: Suspected delayed Swallow;Multiple swallows;Cough - Immediate    Nectar Thick Nectar  Thick Liquid: Impaired Presentation: Cup;Spoon Pharyngeal Phase Impairments: Suspected delayed Swallow;Multiple swallows;Cough - Immediate   Honey Thick Honey Thick Liquid: Not tested   Puree Puree: Within functional limits   Solid     Solid: Impaired Presentation: Self Fed Oral Phase Impairments: Reduced lingual movement/coordination;Impaired mastication Other Comments: minimal prolonged mastication noted - presumed due to poor dentition and xerostomia      Chales Abrahams 11/30/2021,8:00 PM  Rolena Infante, MS [facility] SLP Acute Rehab Services Office [phone] Pager [phone]

## 2021-11-30 NOTE — Progress Notes (Signed)
PROGRESS NOTE  Jonathan Harmon ZOX:[id number] DOB: 02-Nov-1933 DOA: 11/29/2021 PCP: Esperanza Richters, PA-C   LOS: 1 day   Brief Narrative / Interim history: 88 year old male with COPD, chronic hypoxic respiratory failure on 2.5 L at home, dementia comes into the hospital with worsening hypoxia.  He was at his memory care facility, working with therapy when he was short of breath and found to have sats in the 70s with minimal ambulation.  He was brought to the ED.  Imaging with CTA of the chest was negative for PE but it did show increased left lower lobe airspace disease suggesting aspiration versus infection.  He was placed on antibiotics and admitted to the hospital  Subjective / 24h Interval events: Pleasantly demented this morning, denies any shortness of breath.  Assesement and Plan: Principal Problem:   Aspiration pneumonia (HCC) Active Problems:   COPD (chronic obstructive pulmonary disease) (HCC)   UIP (usual interstitial pneumonitis) (HCC)   Dementia (HCC)   Protein-calorie malnutrition, severe   Acute on chronic respiratory failure with hypoxia (HCC)   Pressure injury of skin  Principal problem Acute on chronic hypoxic respiratory failure-likely due to aspiration pneumonia.  Continue antibiotics, nebulizers.  Speech to see, continue n.p.o. until seen.  Palliative care also consulted  Active problems Aspiration pneumonia-cannot fully rule out community-acquired, continue ceftriaxone azithromycin and metronidazole.  Procalcitonin elevated 1.6  COPD, history of UIP-continue DuoNebs for now, no wheezing  Dementia-continue home regimen  Severe protein calorie malnutrition-RD consult  Goals of care-palliative care consulted  Mild normocytic anemia-looks dilutional  Scheduled Meds:  atorvastatin  40 mg Oral QHS   divalproex  125 mg Oral BID   donepezil  10 mg Oral QHS   enoxaparin (LOVENOX) injection  40 mg Subcutaneous Q24H   guaiFENesin  600 mg Oral BID    ipratropium-albuterol  3 mL Nebulization BID   multivitamin with minerals  1 tablet Oral Q breakfast   pantoprazole  40 mg Oral QAC breakfast   sertraline  50 mg Oral Daily   Continuous Infusions:  azithromycin     cefTRIAXone (ROCEPHIN)  IV 2 g (11/30/21 0949)   dextrose 5 % and 0.45% NaCl 75 mL/hr at 11/30/21 0036   metronidazole 500 mg (11/30/21 0257)   PRN Meds:.acetaminophen **OR** acetaminophen, ondansetron **OR** ondansetron (ZOFRAN) IV  Diet Orders (From admission, onward)     Start     Ordered   11/29/21 2300  Diet NPO time specified Except for: Sips with Meds  (Undifferentiated presentation (screening labs and basic nursing orders))  Diet effective now       Question:  Except for  Answer:  Sips with Meds   11/29/21 2259            DVT prophylaxis: enoxaparin (LOVENOX) injection 40 mg Start: 11/30/21 1000   Lab Results  Component Value Date   PLT 253 11/30/2021      Code Status: DNR  Family Communication: No family at bedside  Status is: Inpatient Remains inpatient appropriate because: N.p.o., speech to see   Level of care: Telemetry  Consultants:  Palliative care  Objective: Vitals:   11/30/21 0304 11/30/21 0417 11/30/21 0806 11/30/21 0820  BP:  (!) 92/56  94/62  Pulse:  66  64  Resp:  18  20  Temp:  (!) 97.5 F (36.4 C)    TempSrc:  Oral    SpO2: 96% 100% 100% 100%    Intake/Output Summary (Last 24 hours) at 11/30/2021 1005 Last data filed at 11/30/2021  0900 Gross per 24 hour  Intake 450 ml  Output --  Net 450 ml   Wt Readings from Last 3 Encounters:  10/13/21 51 kg  10/13/21 51 kg  05/31/21 50.3 kg    Examination:  Constitutional: NAD Eyes: no scleral icterus ENMT: Mucous membranes are moist.  Neck: normal, supple Respiratory: Diffuse bibasilar rhonchi, no wheezing.  Tachypneic at times Cardiovascular: Regular rate and rhythm, no murmurs / rubs / gallops. No LE edema.  Abdomen: non distended, no tenderness. Bowel sounds positive.   Musculoskeletal: no clubbing / cyanosis.  Skin: no rashes Neurologic: non focal   Data Reviewed: I have independently reviewed following labs and imaging studies   CBC Recent Labs  Lab 11/29/21 1128 11/30/21 0040  WBC 11.5* 9.2  HGB 13.5 10.7*  HCT 42.8 33.6*  PLT 322 253  MCV 99.3 99.4  MCH 31.3 31.7  MCHC 31.5 31.8  RDW 14.5 14.6  LYMPHSABS 1.4  --   MONOABS 0.9  --   EOSABS 0.0  --   BASOSABS 0.0  --     Recent Labs  Lab 11/29/21 1128 11/29/21 2109 11/30/21 0040  NA 143  --  145  K 4.0  --  3.8  CL 103  --  107  CO2 28  --  29  GLUCOSE 124*  --  139*  BUN 23  --  23  CREATININE 0.81  --  0.82  CALCIUM 9.0  --  8.6*  AST 25  --  20  ALT 16  --  13  ALKPHOS 90  --  76  BILITOT 0.7  --  0.6  ALBUMIN 3.0*  --  2.5*  PROCALCITON  --   --  1.66  LATICACIDVEN 1.7 1.4  --   INR 1.3*  --   --   BNP 129.4*  --   --     ------------------------------------------------------------------------------------------------------------------ No results for input(s): "CHOL", "HDL", "LDLCALC", "TRIG", "CHOLHDL", "LDLDIRECT" in the last 72 hours.  No results found for: "HGBA1C" ------------------------------------------------------------------------------------------------------------------ No results for input(s): "TSH", "T4TOTAL", "T3FREE", "THYROIDAB" in the last 72 hours.  Invalid input(s): "FREET3"  Cardiac Enzymes No results for input(s): "CKMB", "TROPONINI", "MYOGLOBIN" in the last 168 hours.  Invalid input(s): "CK" ------------------------------------------------------------------------------------------------------------------    Component Value Date/Time   BNP 129.4 (H) 11/29/2021 1128    CBG: No results for input(s): "GLUCAP" in the last 168 hours.  Recent Results (from the past 240 hour(s))  Blood Culture (routine x 2)     Status: None (Preliminary result)   Collection Time: 11/29/21 11:20 AM   Specimen: BLOOD  Result Value Ref Range Status    Specimen Description   Final    BLOOD SITE NOT SPECIFIED Performed at [facility], [address], [place], [place] [address]    Special Requests   Final    BOTTLES DRAWN AEROBIC AND ANAEROBIC Blood Culture adequate volume Performed at [facility], [address] [place], [place] [address]    Culture   Final    NO GROWTH < 24 HOURS Performed at [facility], [address] [place], [place] [address]    Report Status PENDING  Incomplete  Resp Panel by RT-PCR (Flu A&B, Covid)     Status: None   Collection Time: 11/29/21 11:28 AM   Specimen: Nasal Swab  Result Value Ref Range Status   SARS Coronavirus 2 by RT PCR NEGATIVE NEGATIVE Final    Comment: (NOTE) SARS-CoV-2 target nucleic acids are  NOT DETECTED.  The SARS-CoV-2 RNA is generally detectable in upper respiratory specimens during the acute phase of infection. The lowest concentration of SARS-CoV-2 viral copies this assay can detect is 138 copies/mL. A negative result does not preclude SARS-Cov-2 infection and should not be used as the sole basis for treatment or other patient management decisions. A negative result may occur with  improper specimen collection/handling, submission of specimen other than nasopharyngeal swab, presence of viral mutation(s) within the areas targeted by this assay, and inadequate number of viral copies(<138 copies/mL). A negative result must be combined with clinical observations, patient history, and epidemiological information. The expected result is Negative.  Fact Sheet for Patients:  BloggerCourse.com  Fact Sheet for Healthcare Providers:  SeriousBroker.it  This test is no t yet approved or cleared by the [place] FDA and  has been authorized for detection and/or diagnosis of SARS-CoV-2 by FDA under an Emergency Use Authorization (EUA). This EUA will remain  in effect (meaning this test  can be used) for the duration of the COVID-19 declaration under Section 564(b)(1) of the Act, 21 U.S.C.section 360bbb-3(b)(1), unless the authorization is terminated  or revoked sooner.       Influenza A by PCR NEGATIVE NEGATIVE Final   Influenza B by PCR NEGATIVE NEGATIVE Final    Comment: (NOTE) The Xpert Xpress SARS-CoV-2/FLU/RSV plus assay is intended as an aid in the diagnosis of influenza from Nasopharyngeal swab specimens and should not be used as a sole basis for treatment. Nasal washings and aspirates are unacceptable for Xpert Xpress SARS-CoV-2/FLU/RSV testing.  Fact Sheet for Patients: BloggerCourse.com  Fact Sheet for Healthcare Providers: SeriousBroker.it  This test is not yet approved or cleared by the [place] FDA and has been authorized for detection and/or diagnosis of SARS-CoV-2 by FDA under an Emergency Use Authorization (EUA). This EUA will remain in effect (meaning this test can be used) for the duration of the COVID-19 declaration under Section 564(b)(1) of the Act, 21 U.S.C. section 360bbb-3(b)(1), unless the authorization is terminated or revoked.  Performed at [facility], [address], [place], [place] [address]   Blood Culture (routine x 2)     Status: None (Preliminary result)   Collection Time: 11/29/21 11:33 AM   Specimen: BLOOD  Result Value Ref Range Status   Specimen Description   Final    BLOOD BLOOD LEFT FOREARM Performed at [facility], [address], [place], [place] [address]    Special Requests   Final    BOTTLES DRAWN AEROBIC AND ANAEROBIC Blood Culture adequate volume Performed at [facility], [address], [place], [place] [address]    Culture   Final    NO GROWTH < 24 HOURS Performed at [facility], [address], [place], [place] [address]    Report Status PENDING  Incomplete     Radiology  Studies: CT Angio Chest PE W/Cm &/Or Wo Cm  Addendum Date: 11/29/2021   ADDENDUM REPORT: 11/29/2021 16:57 ADDENDUM: Not mentioned in the impressions are bilateral calcified pleural plaques, indicative of asbestos related pleural disease. Electronically Signed   By: Jeronimo Greaves M.D.   On: 11/29/2021 16:57   Result Date: 11/29/2021 CLINICAL DATA:  Oxygen desaturation. Recently diagnosed with pneumonia. EXAM: CT ANGIOGRAPHY CHEST WITH CONTRAST TECHNIQUE: Multidetector CT imaging of the chest was performed using the standard protocol during bolus administration of intravenous contrast. Multiplanar CT image reconstructions and MIPs were obtained to evaluate the vascular anatomy. RADIATION  DOSE REDUCTION: This exam was performed according to the departmental dose-optimization program which includes automated exposure control, adjustment of the mA and/or kV according to patient size and/or use of iterative reconstruction technique. CONTRAST:  71mL OMNIPAQUE IOHEXOL 300 MG/ML  SOLN COMPARISON:  Plain films of earlier today.  CT of 10/19/2018 FINDINGS: Cardiovascular: The quality of this exam for evaluation of pulmonary embolism is good. No evidence of pulmonary embolism. Aortic atherosclerosis. Normal heart size, without pericardial effusion. Multivessel coronary artery atherosclerosis. Mediastinum/Nodes: No mediastinal or hilar adenopathy. Upper esophageal fluid level and dilatation including on 60/4. Lungs/Pleura: Bilateral calcified pleural plaques. No pleural fluid. Advanced bullous emphysema. Mild bronchiectasis in both lower lobes. Apparent posterior left upper lobe nodularity on 56/10 at 4 mm is new since 10/18/2021, favoring a focus of atelectasis or less likely infection. Increase in left and similar right lower lobe dependent airspace disease. Upper Abdomen: Mild motion degradation, primarily involving the lower chest and upper abdomen. Normal imaged portions of the liver, spleen, stomach, pancreas, adrenal  glands, right kidney. Too small to characterize left renal lesions are most likely cysts . In the absence of clinically indicated signs/symptoms require(s) no independent follow-up. Musculoskeletal: No acute osseous abnormality. Review of the MIP images confirms the above findings. IMPRESSION: 1.  No evidence of pulmonary embolism. 2. Similar right and increased left lower lobe dependent airspace disease. Residual/recurrent aspiration versus infection. 3. Esophageal air fluid level suggests dysmotility or gastroesophageal reflux. Recommend clinical exclusion of aspiration. 4. Aortic atherosclerosis (ICD10-I70.0), coronary artery atherosclerosis and emphysema (ICD10-J43.9). Electronically Signed: By: Jeronimo Greaves M.D. On: 11/29/2021 14:06   DG Chest Port 1 View  Result Date: 11/29/2021 CLINICAL DATA:  Shortness of breath. EXAM: PORTABLE CHEST 1 VIEW COMPARISON:  Chest radiograph October 21, 2021 FINDINGS: Monitoring leads overlie the patient. Stable cardiac and mediastinal contours. Redemonstrated extensive calcified pleural plaques bilaterally. No definite evidence for acute superimposed process. IMPRESSION: No definite evidence for acute superimposed process on a background of extensive calcified pleural plaques. Electronically Signed   By: Annia Belt M.D.   On: 11/29/2021 12:18     Pamella Pert, MD, PhD [facility]  Between 7 am - 7 pm I am available, please contact me via Amion (for emergencies) or Securechat (non urgent messages)  Between 7 pm - 7 am I am not available, please contact night coverage MD/APP via Amion

## 2021-11-30 NOTE — Evaluation (Signed)
Physical Therapy Evaluation Patient Details Name: Jonathan Harmon MRN: [id number] DOB: July 23, 1933 Today's Date: 11/30/2021  History of Present Illness  88 yo male admitted with asp pna. Hx of dementia, COPD-O2 dep  Clinical Impression  On eval, pt required Min guard-Min A for mobility. He stood at bedside and took several side steps towards [facility]. He did use the bedrail for light support. Dyspnea 3/4 with activity. Pt remained on [place] O2 during session. End of session O2 96% on 4L. No family present during session. Will recommend HHPT f/u at ALF as long as staff at facility can provide current level of assist. Will plan to follow pt during this hospital stay.      Recommendations for follow up therapy are one component of a multi-disciplinary discharge planning process, led by the attending physician.  Recommendations may be updated based on patient status, additional functional criteria and insurance authorization.  Follow Up Recommendations Home health PT (at ALF)      Assistance Recommended at Discharge Frequent or constant Supervision/Assistance  Patient can return home with the following  A little help with walking and/or transfers;A little help with bathing/dressing/bathroom;Assistance with cooking/housework;Assist for transportation;Help with stairs or ramp for entrance    Equipment Recommendations None recommended by PT  Recommendations for Other Services       Functional Status Assessment Patient has had a recent decline in their functional status and demonstrates the ability to make significant improvements in function in a reasonable and predictable amount of time.     Precautions / Restrictions Precautions Precautions: Fall Precaution Comments: monitor O2-wears @ baseline Restrictions Weight Bearing Restrictions: No      Mobility  Bed Mobility Overal bed mobility: Needs Assistance Bed Mobility: Supine to Sit, Sit to Supine     Supine to sit: Supervision, HOB  elevated Sit to supine: Supervision, HOB elevated   General bed mobility comments: Supv for safety, lines    Transfers Overall transfer level: Needs assistance   Transfers: Sit to/from Stand Sit to Stand: Min guard           General transfer comment: Min guard for safety. Mildly unsteady.    Ambulation/Gait Ambulation/Gait assistance: Min assist             General Gait Details: side steps along bedside. Pt did have to hold onto bedrail to steady. Remained on [place] O2-4L. Dyspnea 3/4  Stairs            Wheelchair Mobility    Modified Rankin (Stroke Patients Only)       Balance Overall balance assessment: Needs assistance   Sitting balance-Leahy Scale: Good       Standing balance-Leahy Scale: Fair                               Pertinent Vitals/Pain Pain Assessment Pain Assessment: No/denies pain    Home Living Family/patient expects to be discharged to:: Assisted living                   Additional Comments: memory care    Prior Function Prior Level of Function : Needs assist             Mobility Comments: per chart review, was ambulatory without a device       Hand Dominance   Dominant Hand: Right    Extremity/Trunk Assessment   Upper Extremity Assessment Upper Extremity Assessment: Generalized weakness    Lower Extremity Assessment  Lower Extremity Assessment: Generalized weakness    Cervical / Trunk Assessment Cervical / Trunk Assessment: Kyphotic  Communication   Communication: HOH  Cognition Arousal/Alertness: Awake/alert Behavior During Therapy: WFL for tasks assessed/performed Overall Cognitive Status: History of cognitive impairments - at baseline                                 General Comments: follows commands        General Comments      Exercises     Assessment/Plan    PT Assessment Patient needs continued PT services  PT Problem List Decreased strength;Decreased  mobility;Decreased activity tolerance;Decreased balance;Decreased knowledge of use of DME;Decreased cognition;Decreased safety awareness       PT Treatment Interventions DME instruction;Therapeutic activities;Therapeutic exercise;Gait training;Patient/family education;Balance training;Functional mobility training    PT Goals (Current goals can be found in the Care Plan section)  Acute Rehab PT Goals Patient Stated Goal: none stated besides "I want to sleep" PT Goal Formulation: Patient unable to participate in goal setting Time For Goal Achievement: 12/14/21 Potential to Achieve Goals: Good    Frequency Min 3X/week     Co-evaluation               AM-PAC PT "6 Clicks" Mobility  Outcome Measure Help needed turning from your back to your side while in a flat bed without using bedrails?: None Help needed moving from lying on your back to sitting on the side of a flat bed without using bedrails?: None Help needed moving to and from a bed to a chair (including a wheelchair)?: A Little Help needed standing up from a chair using your arms (e.g., wheelchair or bedside chair)?: A Little Help needed to walk in hospital room?: A Little Help needed climbing 3-5 steps with a railing? : A Little 6 Click Score: 20    End of Session Equipment Utilized During Treatment: Gait belt Activity Tolerance: Patient tolerated treatment well (some dyspnea with activity) Patient left: in bed;with bed alarm set   PT Visit Diagnosis: Muscle weakness (generalized) (M62.81);Difficulty in walking, not elsewhere classified (R26.2)    Time: 1414-1430 PT Time Calculation (min) (ACUTE ONLY): 16 min   Charges:   PT Evaluation $PT Eval Low Complexity: 1 Low            Faye Ramsay, PT Acute Rehabilitation  Office: [phone] Pager: [phone]

## 2021-12-01 DIAGNOSIS — D72829 Elevated white blood cell count, unspecified: Secondary | ICD-10-CM

## 2021-12-01 DIAGNOSIS — J690 Pneumonitis due to inhalation of food and vomit: Secondary | ICD-10-CM | POA: Diagnosis not present

## 2021-12-01 DIAGNOSIS — E43 Unspecified severe protein-calorie malnutrition: Secondary | ICD-10-CM

## 2021-12-01 DIAGNOSIS — E876 Hypokalemia: Secondary | ICD-10-CM

## 2021-12-01 DIAGNOSIS — J449 Chronic obstructive pulmonary disease, unspecified: Secondary | ICD-10-CM

## 2021-12-01 DIAGNOSIS — Z7189 Other specified counseling: Secondary | ICD-10-CM

## 2021-12-01 DIAGNOSIS — J9621 Acute and chronic respiratory failure with hypoxia: Secondary | ICD-10-CM | POA: Diagnosis not present

## 2021-12-01 DIAGNOSIS — G301 Alzheimer's disease with late onset: Secondary | ICD-10-CM

## 2021-12-01 DIAGNOSIS — D539 Nutritional anemia, unspecified: Secondary | ICD-10-CM

## 2021-12-01 DIAGNOSIS — Z515 Encounter for palliative care: Secondary | ICD-10-CM

## 2021-12-01 DIAGNOSIS — F0280 Dementia in other diseases classified elsewhere without behavioral disturbance: Secondary | ICD-10-CM

## 2021-12-01 DIAGNOSIS — R5381 Other malaise: Secondary | ICD-10-CM

## 2021-12-01 LAB — BASIC METABOLIC PANEL
Anion gap: 5 (ref 5–15)
BUN: 19 mg/dL (ref 8–23)
CO2: 33 mmol/L — ABNORMAL HIGH (ref 22–32)
Calcium: 8.5 mg/dL — ABNORMAL LOW (ref 8.9–10.3)
Chloride: 108 mmol/L (ref 98–111)
Creatinine, Ser: 0.72 mg/dL (ref 0.61–1.24)
GFR, Estimated: >60 mL/min (ref >60)
Glucose, Bld: 113 mg/dL — ABNORMAL HIGH (ref 70–99)
Potassium: 3.3 mmol/L — ABNORMAL LOW (ref 3.5–5.1)
Sodium: 146 mmol/L — ABNORMAL HIGH (ref 135–145)

## 2021-12-01 LAB — CBC
HCT: 32.5 % — ABNORMAL LOW (ref 39.0–52.0)
Hemoglobin: 10.1 g/dL — ABNORMAL LOW (ref 13.0–17.0)
MCH: 31.7 pg (ref 26.0–34.0)
MCHC: 31.1 g/dL (ref 30.0–36.0)
MCV: 101.9 fL — ABNORMAL HIGH (ref 80.0–100.0)
Platelets: 257 10*3/uL (ref 150–400)
RBC: 3.19 MIL/uL — ABNORMAL LOW (ref 4.22–5.81)
RDW: 14.3 % (ref 11.5–15.5)
WBC: 14.3 10*3/uL — ABNORMAL HIGH (ref 4.0–10.5)
nRBC: 0 % (ref 0.0–0.2)

## 2021-12-01 LAB — LEGIONELLA PNEUMOPHILA SEROGP 1 UR AG: L. pneumophila Serogp 1 Ur Ag: NEGATIVE

## 2021-12-01 MED ORDER — MOMETASONE FURO-FORMOTEROL FUM 200-5 MCG/ACT IN AERO
2.0000 | INHALATION_SPRAY | Freq: Two times a day (BID) | RESPIRATORY_TRACT | Status: DC
  Administered 2021-12-01 – 2021-12-03 (×4): 2 via RESPIRATORY_TRACT
  Filled 2021-12-01: qty 8.8

## 2021-12-01 MED ORDER — METHYLPREDNISOLONE SODIUM SUCC 40 MG IJ SOLR
40.0000 mg | Freq: Every day | INTRAMUSCULAR | Status: DC
  Administered 2021-12-01 – 2021-12-03 (×3): 40 mg via INTRAVENOUS
  Filled 2021-12-01 (×3): qty 1

## 2021-12-01 MED ORDER — POTASSIUM CHLORIDE CRYS ER 20 MEQ PO TBCR
40.0000 meq | EXTENDED_RELEASE_TABLET | Freq: Once | ORAL | Status: AC
  Administered 2021-12-01: 40 meq via ORAL
  Filled 2021-12-01: qty 2

## 2021-12-01 MED ORDER — MOMETASONE FURO-FORMOTEROL FUM 200-5 MCG/ACT IN AERO
2.0000 | INHALATION_SPRAY | Freq: Two times a day (BID) | RESPIRATORY_TRACT | Status: DC
Start: 2021-12-01 — End: 2021-12-01
  Filled 2021-12-01: qty 8.8

## 2021-12-01 NOTE — Progress Notes (Signed)
Speech Language Pathology Treatment: Dysphagia  Patient Details Name: Jonathan Harmon MRN: [id number] DOB: 04/04/1934 Today's Date: 12/01/2021 Time: 5956-3875 SLP Time Calculation (min) (ACUTE ONLY): 33 min  Assessment / Plan / Recommendation Clinical Impression  SLP follow up for dysphagia management.  Pt alert in bed and willing to consume po intake.  He is observed with intake of french toast, sausage, orange juice, thin grape juice and nectar thick grape juice.  Pt with congested cough at baseline that is much more pronounced after ALL liquid intake via cup - Tolerates thin and nectar via tsp - presumed due to bolus control.  Pt does NOT want liquids via tsp - and palliative consult pending.  Given his potential for esophageal dysmotlity, would not recommend HTL - continue thin- via tsp if pt will allow.  Dependent on GOC, instrumental evaluation may helpful to evaluation pharyngo=cervical esophageal function. However with poor nutrition at baseline and doubt that diet modification will "prevent" aspiration - continuing diet may be most appropriate for his goals/comfort.  Spoke to [employer] and NT re: recommendations.  Advised pt to concern for overt aspiration with thin/nectar in not administered via tsp, which he denies being an issue - therefore buy-in limited.    HPI HPI: 88 year old male with COPD, chronic hypoxic respiratory failure on 2.5 L at home, dementia comes into the hospital with worsening hypoxia.  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  He was at his memory care facility, working with therapy when he was short of breath and found to have sats in the 70s with minimal ambulation. Admitted for aspiration pneumonia.  CT chest showed " Similar right and increased left lower lobe dependent airspace  disease. Residual/recurrent aspiration versus infection.  3. Esophageal air fluid level suggests dysmotility or  gastroesophageal reflux. Recommend  clinical exclusion of aspiration."  Pt has h/o falls.  He is a DNR  Medication list includes Depakote, protonix.      SLP Plan  Continue with current plan of care      Recommendations for follow up therapy are one component of a multi-disciplinary discharge planning process, led by the attending physician.  Recommendations may be updated based on patient status, additional functional criteria and insurance authorization.    Recommendations  Diet recommendations: Dysphagia 3 (mechanical soft);Thin liquid Liquids provided via: Teaspoon Medication Administration: Whole meds with puree Supervision: Intermittent supervision to cue for compensatory strategies Compensations: Slow rate;Small sips/bites;Other (Comment) Postural Changes and/or Swallow Maneuvers: Seated upright 90 degrees;Upright 30-60 min after meal                Oral Care Recommendations: Oral care BID Follow Up Recommendations: Follow physician's recommendations for discharge plan and follow up therapies Assistance recommended at discharge: Frequent or constant Supervision/Assistance SLP Visit Diagnosis: Dysphagia, unspecified (R13.10) Plan: Continue with current plan of care           Jonathan Harmon  12/01/2021, 8:41 AM

## 2021-12-01 NOTE — Progress Notes (Addendum)
PROGRESS NOTE    Russell Housman  [id number] DOB: [birth year] DOA: 11/29/2021 PCP: Mackie Pai, PA-C   Brief Narrative:  88 year old male with COPD, chronic hypoxic respiratory failure on 2.5 L at home, dementia presented with worsening shortness of breath and hypoxia and was found to have sats in the 70s with minimal ambulation.  On presentation, CTA chest was negative for PE but showed increased left lower lobe airspace disease suggesting aspiration versus infection.  He was started on IV antibiotics.  Assessment & Plan:   Acute on chronic hypoxic respiratory failure Possible aspiration pneumonia -Normally wears 2.5 L oxygen at assisted living facility.  Currently still requiring 4 L oxygen via nasal cannula.  Wean off as able.  Continue Rocephin and Zithromax.  DC Flagyl and IV fluids -Follow SLP recommendations.  Diet as per SLP. -Palliative care consultation pending for goals of care discussion.  COPD, history of UIP -Continue nebs.  Dementia Possible depression -continue donepezil, divalproex, sertraline  Severe protein calorie malnutrition -Follow nutrition recommendations  Stage I medial sacral pressure injury: Present on admission -Continue local wound care  Leukocytosis -monitor  Macrocytic anemia -Hemoglobin stable.  Monitor intermittently  Physical deconditioning -PT recommends home health PT at ALF  Goals of care -Palliative care consult pending  Hypokalemia -Replace.  Repeat a.m. labs   DVT prophylaxis: Lovenox Code Status: DNR  family Communication: None at bedside Disposition Plan: Status is: Inpatient Remains inpatient appropriate because: Of need for IV antibiotics.  Possible discharge in 1 to 2 days once clinically improves    Consultants: Palliative care  Procedures: None  Antimicrobials:  Anti-infectives (From admission, onward)    Start     Dose/Rate Route Frequency Ordered Stop   11/30/21 1000  cefTRIAXone (ROCEPHIN) 2 g in  sodium chloride 0.9 % 100 mL IVPB        2 g 200 mL/hr over 30 Minutes Intravenous Every 24 hours 11/29/21 2259 12/05/21 0959   11/30/21 1000  azithromycin (ZITHROMAX) 500 mg in sodium chloride 0.9 % 250 mL IVPB        500 mg 250 mL/hr over 60 Minutes Intravenous Every 24 hours 11/29/21 2259 12/05/21 0959   11/29/21 1430  cefTRIAXone (ROCEPHIN) 1 g in sodium chloride 0.9 % 100 mL IVPB        1 g 200 mL/hr over 30 Minutes Intravenous  Once 11/29/21 1421 11/29/21 1528   11/29/21 1430  azithromycin (ZITHROMAX) 500 mg in sodium chloride 0.9 % 250 mL IVPB        500 mg 250 mL/hr over 60 Minutes Intravenous  Once 11/29/21 1421 11/29/21 1602   11/29/21 1430  metroNIDAZOLE (FLAGYL) IVPB 500 mg        500 mg 100 mL/hr over 60 Minutes Intravenous Every 12 hours 11/29/21 1421          Subjective: Patient seen and examined at bedside.  Poor historian, confused.  No overnight fever, vomiting, seizures reported.  Objective: Vitals:   11/30/21 1916 11/30/21 1940 12/01/21 0320 12/01/21 0749  BP: 113/62  (!) 93/52   Pulse: (!) 55  61   Resp: 16  16   Temp: (!) 97.5 F (36.4 C)  (!) 97.4 F (36.3 C)   TempSrc: Oral  Oral   SpO2: 100% 99% 100% 99%  Weight:        Intake/Output Summary (Last 24 hours) at 12/01/2021 1018 Last data filed at 12/01/2021 0944 Gross per 24 hour  Intake 1762.61 ml  Output --  Net 1762.61  ml   Filed Weights   11/30/21 1314  Weight: 41.3 kg    Examination:  General exam: Appears calm and comfortable.  Currently on 4 L oxygen via nasal cannula.  Elderly male lying in bed. Respiratory system: Bilateral decreased breath sounds at bases with scattered crackles Cardiovascular system: S1 & S2 heard, intermittently bradycardic gastrointestinal system: Abdomen is nondistended, soft and nontender. Normal bowel sounds heard. Extremities: No cyanosis, clubbing; trace lower extremity edema  Central nervous system: Alert and confused.  No focal neurological deficits. Moving  extremities Skin: No rashes, lesions or ulcers Psychiatry: Flat affect.  Not agitated currently.    Data Reviewed: I have personally reviewed following labs and imaging studies  CBC: Recent Labs  Lab 11/29/21 1128 11/30/21 0040 12/01/21 0516  WBC 11.5* 9.2 14.3*  NEUTROABS 9.1*  --   --   HGB 13.5 10.7* 10.1*  HCT 42.8 33.6* 32.5*  MCV 99.3 99.4 101.9*  PLT 322 253 [id number]   Basic Metabolic Panel: Recent Labs  Lab 11/29/21 1128 11/30/21 0040 12/01/21 0516  NA 143 145 146*  K 4.0 3.8 3.3*  CL 103 107 108  CO2 28 29 33*  GLUCOSE 124* 139* 113*  BUN 23 23 19   CREATININE 0.81 0.82 0.72  CALCIUM 9.0 8.6* 8.5*   GFR: Estimated Creatinine Clearance: 39.4 mL/min (by C-G formula based on SCr of 0.72 mg/dL). Liver Function Tests: Recent Labs  Lab 11/29/21 1128 11/30/21 0040  AST 25 20  ALT 16 13  ALKPHOS 90 76  BILITOT 0.7 0.6  PROT 8.3* 6.9  ALBUMIN 3.0* 2.5*   No results for input(s): "LIPASE", "AMYLASE" in the last 168 hours. No results for input(s): "AMMONIA" in the last 168 hours. Coagulation Profile: Recent Labs  Lab 11/29/21 1128  INR 1.3*   Cardiac Enzymes: No results for input(s): "CKTOTAL", "CKMB", "CKMBINDEX", "TROPONINI" in the last 168 hours. BNP (last 3 results) No results for input(s): "PROBNP" in the last 8760 hours. HbA1C: No results for input(s): "HGBA1C" in the last 72 hours. CBG: No results for input(s): "GLUCAP" in the last 168 hours. Lipid Profile: No results for input(s): "CHOL", "HDL", "LDLCALC", "TRIG", "CHOLHDL", "LDLDIRECT" in the last 72 hours. Thyroid Function Tests: No results for input(s): "TSH", "T4TOTAL", "FREET4", "T3FREE", "THYROIDAB" in the last 72 hours. Anemia Panel: No results for input(s): "VITAMINB12", "FOLATE", "FERRITIN", "TIBC", "IRON", "RETICCTPCT" in the last 72 hours. Sepsis Labs: Recent Labs  Lab 11/29/21 1128 11/29/21 2109 11/30/21 0040  PROCALCITON  --   --  1.66  LATICACIDVEN 1.7 1.4  --     Recent  Results (from the past 240 hour(s))  Blood Culture (routine x 2)     Status: None (Preliminary result)   Collection Time: 11/29/21 11:20 AM   Specimen: BLOOD  Result Value Ref Range Status   Specimen Description   Final    BLOOD SITE NOT SPECIFIED Performed at [facility] [address], [place], [place] [address]    Special Requests   Final    BOTTLES DRAWN AEROBIC AND ANAEROBIC Blood Culture adequate volume Performed at [facility] [address], [place], [place] [address]    Culture   Final    NO GROWTH 2 DAYS Performed at [facility] [address] [place], [place] [address]    Report Status PENDING  Incomplete  Urine Culture     Status: Abnormal   Collection Time: 11/29/21 11:28 AM   Specimen: In/Out Cath Urine  Result  Value Ref Range Status   Specimen Description   Final    IN/OUT CATH URINE Performed at [facility] [address] [place], [place] [address]    Special Requests   Final    NONE Performed at [facility], [place] [address], [place], [place] [address]    Culture MULTIPLE SPECIES PRESENT, SUGGEST RECOLLECTION (A)  Final   Report Status 11/30/2021 FINAL  Final  Resp Panel by RT-PCR (Flu A&B, Covid)     Status: None   Collection Time: 11/29/21 11:28 AM   Specimen: Nasal Swab  Result Value Ref Range Status   SARS Coronavirus 2 by RT PCR NEGATIVE NEGATIVE Final    Comment: (NOTE) SARS-CoV-2 target nucleic acids are NOT DETECTED.  The SARS-CoV-2 RNA is generally detectable in upper respiratory specimens during the acute phase of infection. The lowest concentration of SARS-CoV-2 viral copies this assay can detect is 138 copies/mL. A negative result does not preclude SARS-Cov-2 infection and should not be used as the sole basis for treatment or other patient management decisions. A negative result may occur with  improper specimen collection/handling, submission of  specimen other than nasopharyngeal swab, presence of viral mutation(s) within the areas targeted by this assay, and inadequate number of viral copies(<138 copies/mL). A negative result must be combined with clinical observations, patient history, and epidemiological information. The expected result is Negative.  Fact Sheet for Patients:  EntrepreneurPulse.com.au  Fact Sheet for Healthcare Providers:  IncredibleEmployment.be  This test is no t yet approved or cleared by the [place] FDA and  has been authorized for detection and/or diagnosis of SARS-CoV-2 by FDA under an Emergency Use Authorization (EUA). This EUA will remain  in effect (meaning this test can be used) for the duration of the COVID-19 declaration under Section 564(b)(1) of the Act, 21 U.S.C.section 360bbb-3(b)(1), unless the authorization is terminated  or revoked sooner.       Influenza A by PCR NEGATIVE NEGATIVE Final   Influenza B by PCR NEGATIVE NEGATIVE Final    Comment: (NOTE) The Xpert Xpress SARS-CoV-2/FLU/RSV plus assay is intended as an aid in the diagnosis of influenza from Nasopharyngeal swab specimens and should not be used as a sole basis for treatment. Nasal washings and aspirates are unacceptable for Xpert Xpress SARS-CoV-2/FLU/RSV testing.  Fact Sheet for Patients: EntrepreneurPulse.com.au  Fact Sheet for Healthcare Providers: IncredibleEmployment.be  This test is not yet approved or cleared by the [place] FDA and has been authorized for detection and/or diagnosis of SARS-CoV-2 by FDA under an Emergency Use Authorization (EUA). This EUA will remain in effect (meaning this test can be used) for the duration of the COVID-19 declaration under Section 564(b)(1) of the Act, 21 U.S.C. section 360bbb-3(b)(1), unless the authorization is terminated or revoked.  Performed at [facility], [place]  [address], [place], [place] [address]   Blood Culture (routine x 2)     Status: None (Preliminary result)   Collection Time: 11/29/21 11:33 AM   Specimen: BLOOD  Result Value Ref Range Status   Specimen Description   Final    BLOOD BLOOD LEFT FOREARM Performed at [facility] [address], [place], [place] [address]    Special Requests   Final    BOTTLES DRAWN AEROBIC AND ANAEROBIC Blood Culture adequate volume Performed at [facility] [address], [place],  [address]    Culture   Final    NO GROWTH 2 DAYS Performed at  [facility], [address], [place], [place] [address]    Report Status PENDING  Incomplete         Radiology Studies: CT Angio Chest PE W/Cm &/Or Wo Cm  Addendum Date: 11/29/2021   ADDENDUM REPORT: 11/29/2021 16:57 ADDENDUM: Not mentioned in the impressions are bilateral calcified pleural plaques, indicative of asbestos related pleural disease. Electronically Signed   By: Jeronimo Greaves M.D.   On: 11/29/2021 16:57   Result Date: 11/29/2021 CLINICAL DATA:  Oxygen desaturation. Recently diagnosed with pneumonia. EXAM: CT ANGIOGRAPHY CHEST WITH CONTRAST TECHNIQUE: Multidetector CT imaging of the chest was performed using the standard protocol during bolus administration of intravenous contrast. Multiplanar CT image reconstructions and MIPs were obtained to evaluate the vascular anatomy. RADIATION DOSE REDUCTION: This exam was performed according to the departmental dose-optimization program which includes automated exposure control, adjustment of the mA and/or kV according to patient size and/or use of iterative reconstruction technique. CONTRAST:  8mL OMNIPAQUE IOHEXOL 300 MG/ML  SOLN COMPARISON:  Plain films of earlier today.  CT of 10/19/2018 FINDINGS: Cardiovascular: The quality of this exam for evaluation of pulmonary embolism is good. No evidence of pulmonary embolism. Aortic atherosclerosis. Normal heart size,  without pericardial effusion. Multivessel coronary artery atherosclerosis. Mediastinum/Nodes: No mediastinal or hilar adenopathy. Upper esophageal fluid level and dilatation including on 60/4. Lungs/Pleura: Bilateral calcified pleural plaques. No pleural fluid. Advanced bullous emphysema. Mild bronchiectasis in both lower lobes. Apparent posterior left upper lobe nodularity on 56/10 at 4 mm is new since 10/18/2021, favoring a focus of atelectasis or less likely infection. Increase in left and similar right lower lobe dependent airspace disease. Upper Abdomen: Mild motion degradation, primarily involving the lower chest and upper abdomen. Normal imaged portions of the liver, spleen, stomach, pancreas, adrenal glands, right kidney. Too small to characterize left renal lesions are most likely cysts . In the absence of clinically indicated signs/symptoms require(s) no independent follow-up. Musculoskeletal: No acute osseous abnormality. Review of the MIP images confirms the above findings. IMPRESSION: 1.  No evidence of pulmonary embolism. 2. Similar right and increased left lower lobe dependent airspace disease. Residual/recurrent aspiration versus infection. 3. Esophageal air fluid level suggests dysmotility or gastroesophageal reflux. Recommend clinical exclusion of aspiration. 4. Aortic atherosclerosis (ICD10-I70.0), coronary artery atherosclerosis and emphysema (ICD10-J43.9). Electronically Signed: By: Jeronimo Greaves M.D. On: 11/29/2021 14:06   DG Chest Port 1 View  Result Date: 11/29/2021 CLINICAL DATA:  Shortness of breath. EXAM: PORTABLE CHEST 1 VIEW COMPARISON:  Chest radiograph October 21, 2021 FINDINGS: Monitoring leads overlie the patient. Stable cardiac and mediastinal contours. Redemonstrated extensive calcified pleural plaques bilaterally. No definite evidence for acute superimposed process. IMPRESSION: No definite evidence for acute superimposed process on a background of extensive calcified pleural  plaques. Electronically Signed   By: Annia Belt M.D.   On: 11/29/2021 12:18        Scheduled Meds:  atorvastatin  40 mg Oral QHS   divalproex  125 mg Oral BID   donepezil  10 mg Oral QHS   enoxaparin (LOVENOX) injection  40 mg Subcutaneous Q24H   guaiFENesin  600 mg Oral BID   ipratropium-albuterol  3 mL Nebulization BID   multivitamin with minerals  1 tablet Oral Q breakfast   pantoprazole  40 mg Oral QAC breakfast   sertraline  50 mg Oral Daily   Continuous Infusions:  azithromycin 500 mg (12/01/21 0954)   cefTRIAXone (ROCEPHIN)  IV 2 g (12/01/21 0954)   dextrose 5 % and  0.45% NaCl 75 mL/hr at 11/30/21 1548   metronidazole 500 mg (12/01/21 0230)          Glade Lloyd, MD [facility] 12/01/2021, 10:18 AM

## 2021-12-01 NOTE — Consult Note (Signed)
Palliative Care Consult Note                                  Date: 12/01/2021   Patient Name: Jonathan Harmon  DOB: 1933/05/24  MRN: [id number]  Age / Sex: 88 y.o., male  PCP: Marisue Brooklyn Referring Physician: Glade Lloyd, MD  Reason for Consultation: Establishing goals of care  HPI/Patient Profile: 88 y.o. male  with past medical history of COPD, chronic hypoxic respiratory failure on 2.5 L at home, and dementia who presented to [facility] ED on 11/29/2021 with worsening shortness of breath and hypoxia.  He was found to have O2 sats in the 70s with minimal ambulation.  CTA chest was negative for PE but showed increased left lower lobe airspace disease suggesting aspiration versus infection.  He was started on IV antibiotics.  Admitted to [facility] service with acute on chronic hypoxic respiratory failure and possible aspiration pneumonia. Palliative medicine has been consulted for goals of care discussion.   Past Medical History:  Diagnosis Date   Asbestos exposure    diminished lung compacity from asbestos exposure 30 years   COPD (chronic obstructive pulmonary disease) (HCC)    Hyperlipidemia    Hypertension    Pneumonia    x 5    Subjective:   I have reviewed medical records including EPIC notes, labs and imaging, and assessed the patient at bedside.  He is alert and able to tell me he is in the hospital, but is not able to tell me why he is in the hospital.  I spoke with his son [name] by phone to discuss diagnosis, prognosis, GOC, EOL wishes, disposition, and options.  I introduced Palliative Medicine as specialized medical care for people living with serious illness. It focuses on providing relief from the symptoms and stress of a serious illness.   Created space and opportunity for family to explore thoughts and feelings regarding current medical situation. Values and goals of care important to patient and family were attempted to be  elicited.  Questions and concerns addressed. Family encouraged to call with questions or concerns.    Life Review: Kazim is originally from [place], [place].  He worked for the [employer] for many years.  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  He is widowed; his wife passed away in 08/09/2015.  They had 2 children together.  [name] lives locally and is very involved.  His daughter lives in [place].  Functional Status: Davell moved in with [name] and his wife in the fall August 08, 2017.  In the spring 2022, he became ill with pneumonia and his functional status started to decline.  In March 2023, he moved into [facility] and his functional status has "quickly declined" since then.  Discussion: We discussed his current illness and what it means in the larger context of his ongoing co-morbidities. Discussed that chronic illness results in decreased functional status over time, as patients do not usually return to previous baseline after having a major illness.  We also reviewed that dementia is a progressive, non-curable disease underlying the patient's current acute medical conditions. We reviewed specific indicators that patient had reached end stages, including non-ambulatory status, decreased oral intake, and incontinence of bowel/bladder.  Current clinical status was reviewed.  Discussed the issue of dysphagia and risk for aspiration.  Discussed the issue of recurrent aspiration pneumonia.   The difference  between full scope medical intervention and comfort care was considered.  I introduced the concept of a comfort path to family, emphasizing that this path means allowing a natural course to occur. Discussed that the goal is comfort and dignity rather than cure/prolonging life.   [name] feels that his father does not have good quality of life at this point and in many ways is "just existing".  He is open to more comfort focused care. I  discussed with [name] my recommendation to consider the addition of hospice support at assisted living facility.  I explained that hospice would be extra care for patient, communication with family, and assistance with symptom management and care when he/she further declines.  [name] understands and will consider.     Review of Systems  Unable to perform ROS: Dementia    Objective:   Primary Diagnoses: Present on Admission:  Aspiration pneumonia (HCC)  COPD (chronic obstructive pulmonary disease) (HCC)  Dementia (HCC)  UIP (usual interstitial pneumonitis) (HCC)  Protein-calorie malnutrition, severe   Physical Exam Vitals reviewed.  Constitutional:      General: He is not in acute distress.    Comments: Frail and chronically ill-appearing  Pulmonary:     Effort: Pulmonary effort is normal.     Comments: Congested cough Neurological:     Mental Status: He is alert.     Motor: Weakness present.  Psychiatric:        Cognition and Memory: Cognition is impaired. Memory is impaired.     Vital Signs:  BP (!) 93/53 (BP Location: Right Arm)   Pulse 69   Temp 97.7 F (36.5 C)   Resp 16   Wt 41.3 kg   SpO2 98%   BMI 13.84 kg/m   Palliative Assessment/Data: PPS 20-30%     Assessment & Plan:   SUMMARY OF RECOMMENDATIONS   DNR/DNI as previously documented Continue current supportive interventions Goal is to return to [facility] Son is considering addition of hospice for support Plan for follow-up GOC discussion at 9 AM tomorrow  Primary Decision Maker: Son/[name]  Prognosis:  < 6 months  Discharge Planning:  To Be Determined     Thank you for allowing [name] to participate in the care of Conchita Paris  MDM - High  Signed by: Sherlean Foot, NP Palliative Medicine Team  Team Phone # [phone]  For individual providers, please see AMION

## 2021-12-01 NOTE — TOC Initial Note (Addendum)
Transition of Care [facility]) - Initial/Assessment Note    Patient Details  Name: Jonathan Harmon MRN: [id number] Date of Birth: 21-May-1933  [facility]) CM/SW Contact:    Otelia Santee, LCSW Phone Number: 12/01/2021, 10:11 AM  Clinical Narrative:                 Pt currently resides at [facility]. Pt chronically on 2.5L of O2 at home. Pt has been recommended for HHPT while at ALF. CSW left voicemail with pt's son Jonathan Harmon to discuss recommendations.   Update 1100: Spoke with pt's son who confirmed pt lives at [place] and is currently receiving HHPT through [facility]. Pt will continue HHPT when he returns to ALF and resumption of care orders for HHPT will need to be placed prior to discharge. Pt's son is currently on vacation and will not be returning to the area until Saturday but, will be available via telephone in the meantime.   Palliative care has been consulted and TOC will continue to follow for further recommendations.   Expected Discharge Plan: Assisted Living (ALF w/ home health services) Barriers to Discharge: Continued Medical Work up   Patient Goals and CMS Choice Patient states their goals for this hospitalization and ongoing recovery are:: To return to ALF   Choice offered to / list presented to : Adult Children  Expected Discharge Plan and Services Expected Discharge Plan: Assisted Living (ALF w/ home health services) In-house Referral: Hospice / Palliative Care, Clinical Social Work Discharge Planning Services: CM Consult Post Acute Care Choice: Home Health Living arrangements for the past 2 months: Assisted Living Facility                 DME Arranged: N/A DME Agency: NA                  Prior Living Arrangements/Services Living arrangements for the past 2 months: Assisted Living Facility Lives with:: Facility Resident Patient language and need for interpreter reviewed:: Yes Do you feel safe going back to the place where you live?: Yes       Need for Family Participation in Patient Care: Yes (Comment) Care giver support system in place?: Yes (comment) Current home services: DME Criminal Activity/Legal Involvement Pertinent to Current Situation/Hospitalization: No - Comment as needed  Activities of Daily Living   ADL Screening (condition at time of admission) Patient's cognitive ability adequate to safely complete daily activities?: No Is the patient deaf or have difficulty hearing?: Yes Does the patient have difficulty seeing, even when wearing glasses/contacts?: No Does the patient have difficulty concentrating, remembering, or making decisions?: Yes Patient able to express need for assistance with ADLs?: Yes Does the patient have difficulty dressing or bathing?: Yes Independently performs ADLs?: Yes (appropriate for developmental age) Does the patient have difficulty walking or climbing stairs?: Yes Weakness of Legs: Both Weakness of Arms/Hands: None  Permission Sought/Granted Permission sought to share information with : Facility Medical sales representative, Family Supports Permission granted to share information with : Yes, Verbal Permission Granted  Share Information with NAME: Jonathan Harmon     Permission granted to share info w Relationship: Son  Permission granted to share info w Contact Information: [phone]  Emotional Assessment Appearance:: Appears stated age Attitude/Demeanor/Rapport: Charismatic Affect (typically observed): Pleasant Orientation: : Oriented to Self Alcohol / Substance Use: Not Applicable Psych Involvement: No (comment)  Admission diagnosis:  Aspiration pneumonia (HCC) [J69.0] Acute respiratory failure with hypoxia (HCC) [J96.01] Aspiration pneumonia of both lungs, unspecified aspiration  pneumonia type, unspecified part of lung (HCC) [J69.0] Patient Active Problem List   Diagnosis Date Noted   Pressure injury of skin 11/30/2021   Aspiration pneumonia (HCC) 11/29/2021   Acute on chronic  respiratory failure with hypoxia (HCC) 11/29/2021   Hypernatremia 10/20/2021   Acute metabolic encephalopathy 10/20/2021   H/O laceration repair 6/21 on face 10/20/2021   UIP (usual interstitial pneumonitis) (HCC) 10/20/2021   Protein-calorie malnutrition, severe 10/19/2021   Community acquired pneumonia, bilateral with mucus plugging 10/18/2021   Asbestosis (HCC) 08/11/2020   Chronic respiratory failure with hypoxia (HCC) 08/11/2020   HTN (hypertension) 08/11/2020   Arthralgia of both hands 10/29/2019   Allergic rhinitis 08/31/2018   Dementia (HCC) 01/07/2018   Exertional dyspnea 07/28/2016   COPD (chronic obstructive pulmonary disease) (HCC) 07/28/2016   Calcified pleural plaque due to asbestos exposure 07/28/2016   PCP:  Jonathan Harmon Pharmacy:   [facility] - [place], [place] - [address] [place] [address] Phone: [phone] Fax: [phone]     Social Determinants of Health (SDOH) Interventions    Readmission Risk Interventions    12/01/2021   10:07 AM  Readmission Risk Prevention Plan  Transportation Screening Complete  PCP or Specialist Appt within 3-5 Days Complete  HRI or Home Care Consult Complete  Social Work Consult for Recovery Care Planning/Counseling Complete  Medication Review Oceanographer) Complete

## 2021-12-02 DIAGNOSIS — D539 Nutritional anemia, unspecified: Secondary | ICD-10-CM

## 2021-12-02 DIAGNOSIS — J84112 Idiopathic pulmonary fibrosis: Secondary | ICD-10-CM

## 2021-12-02 DIAGNOSIS — G301 Alzheimer's disease with late onset: Secondary | ICD-10-CM | POA: Diagnosis not present

## 2021-12-02 DIAGNOSIS — J9621 Acute and chronic respiratory failure with hypoxia: Secondary | ICD-10-CM | POA: Diagnosis not present

## 2021-12-02 DIAGNOSIS — J690 Pneumonitis due to inhalation of food and vomit: Secondary | ICD-10-CM | POA: Diagnosis not present

## 2021-12-02 DIAGNOSIS — D72829 Elevated white blood cell count, unspecified: Secondary | ICD-10-CM

## 2021-12-02 DIAGNOSIS — R5381 Other malaise: Secondary | ICD-10-CM | POA: Diagnosis not present

## 2021-12-02 DIAGNOSIS — J441 Chronic obstructive pulmonary disease with (acute) exacerbation: Secondary | ICD-10-CM

## 2021-12-02 LAB — CBC WITH DIFFERENTIAL/PLATELET
Abs Immature Granulocytes: 0.09 10*3/uL — ABNORMAL HIGH (ref 0.00–0.07)
Basophils Absolute: 0 10*3/uL (ref 0.0–0.1)
Basophils Relative: 0 %
Eosinophils Absolute: 0 10*3/uL (ref 0.0–0.5)
Eosinophils Relative: 0 %
HCT: 30.6 % — ABNORMAL LOW (ref 39.0–52.0)
Hemoglobin: 9.4 g/dL — ABNORMAL LOW (ref 13.0–17.0)
Immature Granulocytes: 1 %
Lymphocytes Relative: 9 %
Lymphs Abs: 1 10*3/uL (ref 0.7–4.0)
MCH: 31.2 pg (ref 26.0–34.0)
MCHC: 30.7 g/dL (ref 30.0–36.0)
MCV: 101.7 fL — ABNORMAL HIGH (ref 80.0–100.0)
Monocytes Absolute: 0.5 10*3/uL (ref 0.1–1.0)
Monocytes Relative: 4 %
Neutro Abs: 9.6 10*3/uL — ABNORMAL HIGH (ref 1.7–7.7)
Neutrophils Relative %: 86 %
Platelets: 238 10*3/uL (ref 150–400)
RBC: 3.01 MIL/uL — ABNORMAL LOW (ref 4.22–5.81)
RDW: 14.2 % (ref 11.5–15.5)
WBC: 11.2 10*3/uL — ABNORMAL HIGH (ref 4.0–10.5)
nRBC: 0 % (ref 0.0–0.2)

## 2021-12-02 LAB — MAGNESIUM: Magnesium: 1.9 mg/dL (ref 1.7–2.4)

## 2021-12-02 LAB — BASIC METABOLIC PANEL
Anion gap: 3 — ABNORMAL LOW (ref 5–15)
BUN: 15 mg/dL (ref 8–23)
CO2: 33 mmol/L — ABNORMAL HIGH (ref 22–32)
Calcium: 8.4 mg/dL — ABNORMAL LOW (ref 8.9–10.3)
Chloride: 109 mmol/L (ref 98–111)
Creatinine, Ser: 0.74 mg/dL (ref 0.61–1.24)
GFR, Estimated: >60 mL/min (ref >60)
Glucose, Bld: 112 mg/dL — ABNORMAL HIGH (ref 70–99)
Potassium: 3.9 mmol/L (ref 3.5–5.1)
Sodium: 145 mmol/L (ref 135–145)

## 2021-12-02 MED ORDER — AZITHROMYCIN 250 MG PO TABS
500.0000 mg | ORAL_TABLET | Freq: Every day | ORAL | Status: DC
  Administered 2021-12-02 – 2021-12-03 (×2): 500 mg via ORAL
  Filled 2021-12-02 (×2): qty 2

## 2021-12-02 NOTE — Progress Notes (Signed)
Physical Therapy Treatment Patient Details Name: Jonathan Harmon MRN: [id number] DOB: [birth year] Today's Date: 12/02/2021   History of Present Illness 88 yo male admitted with asp pna. Hx of dementia, COPD-O2 dep    PT Comments    The patient is  supervision for bed mobility and min guard to stand and step to recliner, telling therapist to not hold onto him . Patient had  incontinent BM , assisted with pericare while standing. Patient should be able to progress ambulation . SPo2 on 4 L 92%.   Recommendations for follow up therapy are one component of a multi-disciplinary discharge planning process, led by the attending physician.  Recommendations may be updated based on patient status, additional functional criteria and insurance authorization.  Follow Up Recommendations  Home health PT (at ALF)     Assistance Recommended at Discharge Frequent or constant Supervision/Assistance  Patient can return home with the following A little help with walking and/or transfers;A little help with bathing/dressing/bathroom;Assistance with cooking/housework;Assist for transportation;Help with stairs or ramp for entrance   Equipment Recommendations  None recommended by PT    Recommendations for Other Services       Precautions / Restrictions Precautions Precaution Comments: monitor O2-wears @ baseline, wear depends, incont. ov B/B     Mobility  Bed Mobility   Bed Mobility: Supine to Sit     Supine to sit: Supervision, HOB elevated     General bed mobility comments: Supv for safety, lines    Transfers     Transfers: Sit to/from Stand Sit to Stand: Min guard           General transfer comment: pt declined Rw  nor HHA, able to step to recliner, had a BM , stood at recliner and held onto rail for [employer].    Ambulation/Gait                   Stairs             Wheelchair Mobility    Modified Rankin (Stroke Patients Only)       Balance     Sitting  balance-Leahy Scale: Good     Standing balance support: No upper extremity supported Standing balance-Leahy Scale: Fair                              Cognition Arousal/Alertness: Awake/alert Behavior During Therapy: WFL for tasks assessed/performed Overall Cognitive Status: History of cognitive impairments - at baseline                                 General Comments: did not want therapist to touch hime when getting up        Exercises      General Comments        Pertinent Vitals/Pain Pain Assessment Pain Assessment: No/denies pain    Home Living                          Prior Function            PT Goals (current goals can now be found in the care plan section) Progress towards PT goals: Progressing toward goals    Frequency    Min 3X/week      PT Plan      Co-evaluation  AM-PAC PT "6 Clicks" Mobility   Outcome Measure  Help needed turning from your back to your side while in a flat bed without using bedrails?: None Help needed moving from lying on your back to sitting on the side of a flat bed without using bedrails?: None Help needed moving to and from a bed to a chair (including a wheelchair)?: A Little Help needed standing up from a chair using your arms (e.g., wheelchair or bedside chair)?: A Little Help needed to walk in hospital room?: A Little Help needed climbing 3-5 steps with a railing? : A Little 6 Click Score: 20    End of Session   Activity Tolerance: Patient tolerated treatment well Patient left: in chair;with call bell/phone within reach;with family/visitor present Nurse Communication: Mobility status PT Visit Diagnosis: Muscle weakness (generalized) (M62.81);Difficulty in walking, not elsewhere classified (R26.2)     Time: 3845-3646 PT Time Calculation (min) (ACUTE ONLY): 30 min  Charges:  $Gait Training: 8-22 mins $Self Care/Home Management: 8-22                      Blanchard Kelch PT Acute Rehabilitation Services Office [phone] Weekend pager-[phone]    Rada Hay 12/02/2021, 5:18 PM

## 2021-12-02 NOTE — Progress Notes (Signed)
Palliative Medicine Progress Note   Patient Name: Jonathan Harmon       Date: 12/02/2021 DOB: 01/06/1934  Age: 88 y.o. MRN#: [id number] Attending Physician: Glade Lloyd, MD Primary Care Physician: Marisue Brooklyn Admit Date: 11/29/2021  Reason for Consultation/Follow-up: Establishing goals of care  HPI/Patient Profile:  88 y.o. male  with past medical history of COPD, chronic hypoxic respiratory failure on 2.5 L at home, and dementia who presented to [facility] ED on 11/29/2021 with worsening shortness of breath and hypoxia.  He was found to have O2 sats in the 70s with minimal ambulation.  CTA chest was negative for PE but showed increased left lower lobe airspace disease suggesting aspiration versus infection.  He was started on IV antibiotics.  Admitted to [facility] service with acute on chronic hypoxic respiratory failure and possible aspiration pneumonia. Palliative medicine has been consulted for goals of care discussion.    Subjective: Chart reviewed and patient assessed at bedside.   I spoke with son/[name] again today by phone. We reviewed the philosophy and benefits of hospice care. Discussed that it offers a holistic approach to care in the setting of end-stage illness/disease, and is about supporting the patient where they are while allowing the natural course to occur. Discussed the hospice team includes RNs, physicians, social workers, and chaplains. They can provide personal care, support for the family, symptom management, and help keep patient out of the hospital.  We also discussed Mr. Kipp decreased mobility and decreased oral intake in the setting of progressive dementia and failure to thrive. Reviewed concern that his prognosis may be 6 months or less.   After consideration since our  discussion yesterday, [name] agrees that hospice would be an appropriate and beneficial addition to his father's care at the assisted living facility.  I have communicated this to Dr. Hanley Ben and [facility] via secure chat.    Objective:  Physical Exam Vitals reviewed.  Constitutional:      General: He is not in acute distress.    Comments: Frail and chronically ill-appearing  Pulmonary:     Effort: Pulmonary effort is normal.     Comments: Congested cough Neurological:     Mental Status: He is alert.  Psychiatric:        Cognition and Memory: Cognition is impaired. Memory is impaired.  Vital Signs: BP (!) 105/57 (BP Location: Right Arm)   Pulse 61   Temp (!) 97.5 F (36.4 C) (Oral)   Resp 18   Wt 41.3 kg   SpO2 94%   BMI 13.84 kg/m  SpO2: SpO2: 94 % O2 Device: O2 Device: Nasal Cannula O2 Flow Rate: O2 Flow Rate (L/min): 2.5 L/min    LBM: Last BM Date : 12/01/21     Palliative Medicine Assessment & Plan   Assessment: Principal Problem:   Aspiration pneumonia (HCC) Active Problems:   COPD (chronic obstructive pulmonary disease) (HCC)   Dementia (HCC)   Protein-calorie malnutrition, severe   UIP (usual interstitial pneumonitis) (HCC)   Acute on chronic respiratory failure with hypoxia (HCC)   Pressure injury of skin   Hypokalemia   Leukocytosis   Macrocytic anemia   Physical deconditioning    Recommendations/Plan: DNR/DNI as previously documented Continue current supportive interventions Plan is to return to [facility] with hospice - TOC order placed PMT will continue to follow    Prognosis:  < 6 months  Discharge Planning: ALF with hospice services    Thank you for allowing the Palliative Medicine Team to assist in the care of this patient.   MDM - High   Merry Proud, NP   Please contact Palliative Medicine Team phone at [phone] for questions and concerns.  For individual providers, please see AMION.

## 2021-12-02 NOTE — Progress Notes (Signed)
PROGRESS NOTE    Jonathan Harmon  KGM:[id number] DOB: 1934-01-02 DOA: 11/29/2021 PCP: Esperanza Richters, PA-C   Brief Narrative:  88 year old male with COPD, chronic hypoxic respiratory failure on 2.5 L at home, dementia presented with worsening shortness of breath and hypoxia and was found to have sats in the 70s with minimal ambulation.  On presentation, CTA chest was negative for PE but showed increased left lower lobe airspace disease suggesting aspiration versus infection.  He was started on IV antibiotics.  Assessment & Plan:   Acute on chronic hypoxic respiratory failure Possible aspiration pneumonia -Normally wears 2.5 L oxygen at assisted living facility.  Currently on 2.5 L oxygen via nasal cannula.  Continue Rocephin and Zithromax.   -Diet as per SLP. -Palliative care evaluation appreciated.  COPD with possible exacerbation, history of UIP -Continue nebs.  Continue Dulera and IV Solu-Medrol.  Dementia Possible depression -continue donepezil, divalproex, sertraline  Severe protein calorie malnutrition -Follow nutrition recommendations  Stage I medial sacral pressure injury: Present on admission -Continue local wound care  Leukocytosis -Improving  Macrocytic anemia -Hemoglobin stable.  Monitor intermittently  Physical deconditioning -PT recommends home health PT at ALF  Goals of care -Palliative care following  Hypokalemia -Improved  DVT prophylaxis: Lovenox Code Status: DNR  family Communication: None at bedside Disposition Plan: Status is: Inpatient because: Of need for IV antibiotics and steroids.  Possible discharge in 1 to 2 days once clinically improves    Consultants: Palliative care  Procedures: None  Antimicrobials:  Anti-infectives (From admission, onward)    Start     Dose/Rate Route Frequency Ordered Stop   11/30/21 1000  cefTRIAXone (ROCEPHIN) 2 g in sodium chloride 0.9 % 100 mL IVPB        2 g 200 mL/hr over 30 Minutes Intravenous Every  24 hours 11/29/21 2259 12/05/21 0959   11/30/21 1000  azithromycin (ZITHROMAX) 500 mg in sodium chloride 0.9 % 250 mL IVPB        500 mg 250 mL/hr over 60 Minutes Intravenous Every 24 hours 11/29/21 2259 12/05/21 0959   11/29/21 1430  cefTRIAXone (ROCEPHIN) 1 g in sodium chloride 0.9 % 100 mL IVPB        1 g 200 mL/hr over 30 Minutes Intravenous  Once 11/29/21 1421 11/29/21 1528   11/29/21 1430  azithromycin (ZITHROMAX) 500 mg in sodium chloride 0.9 % 250 mL IVPB        500 mg 250 mL/hr over 60 Minutes Intravenous  Once 11/29/21 1421 11/29/21 1602   11/29/21 1430  metroNIDAZOLE (FLAGYL) IVPB 500 mg  Status:  Discontinued        500 mg 100 mL/hr over 60 Minutes Intravenous Every 12 hours 11/29/21 1421 12/01/21 1034        Subjective: Patient seen and examined at bedside.  Poor historian, confused.  No seizures, agitation, fever reported. Objective: Vitals:   12/01/21 1340 12/01/21 2006 12/01/21 2049 12/02/21 0355  BP: (!) 93/53 (!) 98/58  (!) 105/57  Pulse: 69 63  61  Resp: 16 18  18   Temp: 97.7 F (36.5 C) 97.7 F (36.5 C)  (!) 97.5 F (36.4 C)  TempSrc:  Oral  Oral  SpO2: 98% 96% 96% 96%  Weight:        Intake/Output Summary (Last 24 hours) at 12/02/2021 0733 Last data filed at 12/01/2021 0944 Gross per 24 hour  Intake 60 ml  Output --  Net 60 ml    Filed Weights   11/30/21 1314  Weight:  41.3 kg    Examination:  General: On 2.5 L oxygen via nasal cannula.  No distress.  Elderly male lying in bed. ENT/neck: No thyromegaly.  JVD is not elevated  respiratory: Decreased breath sounds at bases bilaterally with some crackles; no wheezing  CVS: S1-S2 heard, mildly bradycardic intermittently  abdominal: Soft, nontender, slightly distended; no organomegaly, normal bowel sounds are heard Extremities: Trace lower extremity edema; no cyanosis  CNS: Awake but confused.  Poor historian.  No focal neurologic deficit.  Moves extremities Lymph: No obvious lymphadenopathy Skin:  No obvious ecchymosis/lesions  psych: Flat affect.  Showing no signs of agitation.   Musculoskeletal: No obvious joint swelling/deformity     Data Reviewed: I have personally reviewed following labs and imaging studies  CBC: Recent Labs  Lab 11/29/21 1128 11/30/21 0040 12/01/21 0516 12/02/21 0510  WBC 11.5* 9.2 14.3* 11.2*  NEUTROABS 9.1*  --   --  9.6*  HGB 13.5 10.7* 10.1* 9.4*  HCT 42.8 33.6* 32.5* 30.6*  MCV 99.3 99.4 101.9* 101.7*  PLT 322 253 257 238    Basic Metabolic Panel: Recent Labs  Lab 11/29/21 1128 11/30/21 0040 12/01/21 0516 12/02/21 0510  NA 143 145 146* 145  K 4.0 3.8 3.3* 3.9  CL 103 107 108 109  CO2 28 29 33* 33*  GLUCOSE 124* 139* 113* 112*  BUN 23 23 19 15   CREATININE 0.81 0.82 0.72 0.74  CALCIUM 9.0 8.6* 8.5* 8.4*  MG  --   --   --  1.9    GFR: Estimated Creatinine Clearance: 39.4 mL/min (by C-G formula based on SCr of 0.74 mg/dL). Liver Function Tests: Recent Labs  Lab 11/29/21 1128 11/30/21 0040  AST 25 20  ALT 16 13  ALKPHOS 90 76  BILITOT 0.7 0.6  PROT 8.3* 6.9  ALBUMIN 3.0* 2.5*    No results for input(s): "LIPASE", "AMYLASE" in the last 168 hours. No results for input(s): "AMMONIA" in the last 168 hours. Coagulation Profile: Recent Labs  Lab 11/29/21 1128  INR 1.3*    Cardiac Enzymes: No results for input(s): "CKTOTAL", "CKMB", "CKMBINDEX", "TROPONINI" in the last 168 hours. BNP (last 3 results) No results for input(s): "PROBNP" in the last 8760 hours. HbA1C: No results for input(s): "HGBA1C" in the last 72 hours. CBG: No results for input(s): "GLUCAP" in the last 168 hours. Lipid Profile: No results for input(s): "CHOL", "HDL", "LDLCALC", "TRIG", "CHOLHDL", "LDLDIRECT" in the last 72 hours. Thyroid Function Tests: No results for input(s): "TSH", "T4TOTAL", "FREET4", "T3FREE", "THYROIDAB" in the last 72 hours. Anemia Panel: No results for input(s): "VITAMINB12", "FOLATE", "FERRITIN", "TIBC", "IRON",  "RETICCTPCT" in the last 72 hours. Sepsis Labs: Recent Labs  Lab 11/29/21 1128 11/29/21 2109 11/30/21 0040  PROCALCITON  --   --  1.66  LATICACIDVEN 1.7 1.4  --      Recent Results (from the past 240 hour(s))  Blood Culture (routine x 2)     Status: None (Preliminary result)   Collection Time: 11/29/21 11:20 AM   Specimen: BLOOD  Result Value Ref Range Status   Specimen Description   Final    BLOOD SITE NOT SPECIFIED Performed at [facility], [address] [place], [place]    Special Requests   Final    BOTTLES DRAWN AEROBIC AND ANAEROBIC Blood Culture adequate volume Performed at [facility], [address] [place], [place]    Culture   Final    NO GROWTH 2 DAYS Performed  at [facility], [place] [address], [place], [place] [address]    Report Status PENDING  Incomplete  Urine Culture     Status: Abnormal   Collection Time: 11/29/21 11:28 AM   Specimen: In/Out Cath Urine  Result Value Ref Range Status   Specimen Description   Final    IN/OUT CATH URINE Performed at [place] [address] [place], [place] [address]    Special Requests   Final    NONE Performed at [facility], [place] [address] [place], [place] [address]    Culture MULTIPLE SPECIES PRESENT, SUGGEST RECOLLECTION (A)  Final   Report Status 11/30/2021 FINAL  Final  Resp Panel by RT-PCR (Flu A&B, Covid)     Status: None   Collection Time: 11/29/21 11:28 AM   Specimen: Nasal Swab  Result Value Ref Range Status   SARS Coronavirus 2 by RT PCR NEGATIVE NEGATIVE Final    Comment: (NOTE) SARS-CoV-2 target nucleic acids are NOT DETECTED.  The SARS-CoV-2 RNA is generally detectable in upper respiratory specimens during the acute phase of infection. The lowest concentration of SARS-CoV-2 viral copies this assay can detect is 138 copies/mL. A negative result does not preclude SARS-Cov-2 infection and  should not be used as the sole basis for treatment or other patient management decisions. A negative result may occur with  improper specimen collection/handling, submission of specimen other than nasopharyngeal swab, presence of viral mutation(s) within the areas targeted by this assay, and inadequate number of viral copies(<138 copies/mL). A negative result must be combined with clinical observations, patient history, and epidemiological information. The expected result is Negative.  Fact Sheet for Patients:  EntrepreneurPulse.com.au  Fact Sheet for Healthcare Providers:  IncredibleEmployment.be  This test is no t yet approved or cleared by the [place] FDA and  has been authorized for detection and/or diagnosis of SARS-CoV-2 by FDA under an Emergency Use Authorization (EUA). This EUA will remain  in effect (meaning this test can be used) for the duration of the COVID-19 declaration under Section 564(b)(1) of the Act, 21 U.S.C.section 360bbb-3(b)(1), unless the authorization is terminated  or revoked sooner.       Influenza A by PCR NEGATIVE NEGATIVE Final   Influenza B by PCR NEGATIVE NEGATIVE Final    Comment: (NOTE) The Xpert Xpress SARS-CoV-2/FLU/RSV plus assay is intended as an aid in the diagnosis of influenza from Nasopharyngeal swab specimens and should not be used as a sole basis for treatment. Nasal washings and aspirates are unacceptable for Xpert Xpress SARS-CoV-2/FLU/RSV testing.  Fact Sheet for Patients: EntrepreneurPulse.com.au  Fact Sheet for Healthcare Providers: IncredibleEmployment.be  This test is not yet approved or cleared by the [place] FDA and has been authorized for detection and/or diagnosis of SARS-CoV-2 by FDA under an Emergency Use Authorization (EUA). This EUA will remain in effect (meaning this test can be used) for the duration of the COVID-19 declaration  under Section 564(b)(1) of the Act, 21 U.S.C. section 360bbb-3(b)(1), unless the authorization is terminated or revoked.  Performed at [facility], [place] [address], [place], [place] [address]   Blood Culture (routine x 2)     Status: None (Preliminary result)   Collection Time: 11/29/21 11:33 AM   Specimen: BLOOD  Result Value Ref Range Status   Specimen Description   Final    BLOOD BLOOD LEFT FOREARM Performed at [place] [address], [place], [place] [address]    Special Requests  Final    BOTTLES DRAWN AEROBIC AND ANAEROBIC Blood Culture adequate volume Performed at [place] [address], [place], [place] [address]    Culture   Final    NO GROWTH 2 DAYS Performed at [place] [address], [place], [place] [address]    Report Status PENDING  Incomplete         Radiology Studies: No results found.      Scheduled Meds:  atorvastatin  40 mg Oral QHS   divalproex  125 mg Oral BID   donepezil  10 mg Oral QHS   enoxaparin (LOVENOX) injection  40 mg Subcutaneous Q24H   guaiFENesin  600 mg Oral BID   ipratropium-albuterol  3 mL Nebulization BID   methylPREDNISolone (SOLU-MEDROL) injection  40 mg Intravenous Daily   mometasone-formoterol  2 puff Inhalation BID   multivitamin with minerals  1 tablet Oral Q breakfast   pantoprazole  40 mg Oral QAC breakfast   sertraline  50 mg Oral Daily   Continuous Infusions:  azithromycin 500 mg (12/01/21 0954)   cefTRIAXone (ROCEPHIN)  IV 2 g (12/01/21 0954)          Aline August, MD [facility] 12/02/2021, 7:33 AM

## 2021-12-02 NOTE — Plan of Care (Signed)
No acute events overnight. Problem: Activity: Goal: Ability to tolerate increased activity will improve Outcome: Progressing Goal: Will verbalize the importance of balancing activity with adequate rest periods Outcome: Progressing   Problem: Respiratory: Goal: Ability to maintain a clear airway will improve Outcome: Progressing Goal: Levels of oxygenation will improve Outcome: Progressing Goal: Ability to maintain adequate ventilation will improve Outcome: Progressing   Problem: Activity: Goal: Ability to tolerate increased activity will improve Outcome: Progressing   Problem: Clinical Measurements: Goal: Ability to maintain a body temperature in the normal range will improve Outcome: Progressing   Problem: Respiratory: Goal: Ability to maintain adequate ventilation will improve Outcome: Progressing Goal: Ability to maintain a clear airway will improve Outcome: Progressing   Problem: Clinical Measurements: Goal: Ability to maintain clinical measurements within normal limits will improve Outcome: Progressing Goal: Respiratory complications will improve Outcome: Progressing Goal: Cardiovascular complication will be avoided Outcome: Progressing   Problem: Nutrition: Goal: Adequate nutrition will be maintained Outcome: Progressing   Problem: Coping: Goal: Level of anxiety will decrease Outcome: Progressing   Problem: Elimination: Goal: Will not experience complications related to bowel motility Outcome: Progressing Goal: Will not experience complications related to urinary retention Outcome: Progressing   Problem: Pain Managment: Goal: General experience of comfort will improve Outcome: Progressing   Problem: Safety: Goal: Ability to remain free from injury will improve Outcome: Progressing   Problem: Skin Integrity: Goal: Risk for impaired skin integrity will decrease Outcome: Progressing

## 2021-12-03 DIAGNOSIS — G301 Alzheimer's disease with late onset: Secondary | ICD-10-CM | POA: Diagnosis not present

## 2021-12-03 DIAGNOSIS — J441 Chronic obstructive pulmonary disease with (acute) exacerbation: Secondary | ICD-10-CM | POA: Diagnosis not present

## 2021-12-03 DIAGNOSIS — J9621 Acute and chronic respiratory failure with hypoxia: Secondary | ICD-10-CM | POA: Diagnosis not present

## 2021-12-03 DIAGNOSIS — J690 Pneumonitis due to inhalation of food and vomit: Secondary | ICD-10-CM | POA: Diagnosis not present

## 2021-12-03 LAB — CBC WITH DIFFERENTIAL/PLATELET
Abs Immature Granulocytes: 0.12 10*3/uL — ABNORMAL HIGH (ref 0.00–0.07)
Basophils Absolute: 0 10*3/uL (ref 0.0–0.1)
Basophils Relative: 0 %
Eosinophils Absolute: 0 10*3/uL (ref 0.0–0.5)
Eosinophils Relative: 0 %
HCT: 32.2 % — ABNORMAL LOW (ref 39.0–52.0)
Hemoglobin: 9.9 g/dL — ABNORMAL LOW (ref 13.0–17.0)
Immature Granulocytes: 1 %
Lymphocytes Relative: 18 %
Lymphs Abs: 1.6 10*3/uL (ref 0.7–4.0)
MCH: 30.8 pg (ref 26.0–34.0)
MCHC: 30.7 g/dL (ref 30.0–36.0)
MCV: 100.3 fL — ABNORMAL HIGH (ref 80.0–100.0)
Monocytes Absolute: 0.5 10*3/uL (ref 0.1–1.0)
Monocytes Relative: 6 %
Neutro Abs: 6.7 10*3/uL (ref 1.7–7.7)
Neutrophils Relative %: 75 %
Platelets: 252 10*3/uL (ref 150–400)
RBC: 3.21 MIL/uL — ABNORMAL LOW (ref 4.22–5.81)
RDW: 13.8 % (ref 11.5–15.5)
WBC: 8.9 10*3/uL (ref 4.0–10.5)
nRBC: 0 % (ref 0.0–0.2)

## 2021-12-03 LAB — BASIC METABOLIC PANEL
Anion gap: 5 (ref 5–15)
BUN: 11 mg/dL (ref 8–23)
CO2: 35 mmol/L — ABNORMAL HIGH (ref 22–32)
Calcium: 8.3 mg/dL — ABNORMAL LOW (ref 8.9–10.3)
Chloride: 106 mmol/L (ref 98–111)
Creatinine, Ser: 0.64 mg/dL (ref 0.61–1.24)
GFR, Estimated: >60 mL/min (ref >60)
Glucose, Bld: 87 mg/dL (ref 70–99)
Potassium: 3.5 mmol/L (ref 3.5–5.1)
Sodium: 146 mmol/L — ABNORMAL HIGH (ref 135–145)

## 2021-12-03 LAB — MAGNESIUM: Magnesium: 1.8 mg/dL (ref 1.7–2.4)

## 2021-12-03 MED ORDER — ATORVASTATIN CALCIUM 40 MG PO TABS: 40.0000 mg | ORAL_TABLET | Freq: Every day | ORAL | Status: AC

## 2021-12-03 MED ORDER — PANTOPRAZOLE SODIUM 40 MG PO TBEC: 40.0000 mg | DELAYED_RELEASE_TABLET | Freq: Every day | ORAL | Status: AC

## 2021-12-03 MED ORDER — CEFUROXIME AXETIL 500 MG PO TABS: 500.0000 mg | ORAL_TABLET | Freq: Two times a day (BID) | ORAL | 0 refills | Status: AC

## 2021-12-03 MED ORDER — DONEPEZIL HCL 10 MG PO TABS: 10.0000 mg | ORAL_TABLET | Freq: Every day | ORAL | Status: AC

## 2021-12-03 MED ORDER — PREDNISONE 20 MG PO TABS: 40.0000 mg | ORAL_TABLET | Freq: Every day | ORAL | 0 refills | Status: AC

## 2021-12-03 NOTE — Discharge Summary (Signed)
Physician Discharge Summary  Dresean Hutton [id number] DOB: [birth year] DOA: 11/29/2021  PCP: Mackie Pai, PA-C  Admit date: 11/29/2021 Discharge date: 12/03/2021  Admitted From: ALF Disposition: ALF with hospice  Recommendations for Outpatient Follow-up:  Follow up with PCP in 1 week with repeat CBC/BMP Outpatient follow-up with hospice. Follow up in ED if symptoms worsen or new appear   Home Health: Home health PT Equipment/Devices: Continue supplemental oxygen via nasal cannula Discharge Condition: Stable CODE STATUS: Full Diet recommendation: Heart healthy/diet as per SLP recommendations Diet recommendations: Dysphagia 3 (mechanical soft);Thin liquid Liquids provided via: Teaspoon Medication Administration: Whole meds with puree Supervision: Intermittent supervision to cue for compensatory strategies Compensations: Slow rate;Small sips/bites;Other (Comment) Postural Changes and/or Swallow Maneuvers: Seated upright 90 degrees;Upright 30-60 min after meal  Brief/Interim Summary: 88 year old male with COPD, chronic hypoxic respiratory failure on 2.5 L at home, dementia presented with worsening shortness of breath and hypoxia and was found to have sats in the 70s with minimal ambulation.  On presentation, CTA chest was negative for PE but showed increased left lower lobe airspace disease suggesting aspiration versus infection.  He was started on IV antibiotics.  During the hospitalization, his condition has gradually improved.  His respiratory status has improved.  Tolerating diet as per SLP.  Palliative care evaluation appreciated.  Family interested in returning to the facility with hospice.  He will be discharged back to ALF today on few days of oral antibiotics and oral prednisone for 7 days.  Discharge Diagnoses:   Acute on chronic hypoxic respiratory failure Possible aspiration pneumonia -Normally wears 2.5 L oxygen at assisted living facility.  Currently on 2.5 L oxygen via  nasal cannula.  Treated with Rocephin and Zithromax.   -Diet as per SLP. -Discharge back to ALF today on oral Ceftin for 3 more days.   COPD with possible exacerbation, history of UIP -Improving.  Currently on IV Solu-Medrol.  Switch to oral prednisone 40 mg daily for 7 days.  Continue home inhaled regimen.  Dementia Possible depression -continue donepezil, divalproex, sertraline   Severe protein calorie malnutrition -Follow nutrition recommendations   Stage I medial sacral pressure injury: Present on admission -Continue local wound care   Leukocytosis -Resolved  Macrocytic anemia -Hemoglobin stable.  Monitor intermittently   Physical deconditioning -PT recommends home health PT at ALF   Goals of care -Palliative care evaluation appreciated.  Family interested in returning to the facility with hospice.     Hypokalemia -Improved  Hypernatremia -Mild.  Monitor intermittently as an outpatient  Discharge Instructions  Discharge Instructions     Diet - low sodium heart healthy   Complete by: As directed    Discharge wound care:   Complete by: As directed    As per wound care RN recommendations:    Foam dressing  Every 3 days     Comments: Silicone foam dressings to the sacrum change every 3 days. Assess under dressings each shift for any acute changes in the wounds.   Increase activity slowly   Complete by: As directed       Allergies as of 12/03/2021       Reactions   Penicillins Swelling, Rash, Other (See Comments)   Reported to family member - occurred when he was in his 55s- "Allergic," per [facility]        Medication List     TAKE these medications    Advair Diskus 250-50 MCG/ACT Aepb Generic drug: fluticasone-salmeterol Inhale 2 puffs into the lungs daily.   albuterol  108 (90 Base) MCG/ACT inhaler Commonly known as: VENTOLIN HFA inhale 2 puffs by mouth into the lungs every 6 hours as needed for wheezing and shortness of breath What changed:  how  much to take when to take this reasons to take this   atorvastatin 40 MG tablet Commonly known as: LIPITOR Take 1 tablet (40 mg total) by mouth at bedtime.   cefUROXime 500 MG tablet Commonly known as: CEFTIN Take 1 tablet (500 mg total) by mouth 2 (two) times daily for 3 days.   divalproex 125 MG capsule Commonly known as: DEPAKOTE SPRINKLE Take 125 mg by mouth 2 (two) times daily.   donepezil 10 MG tablet Commonly known as: ARICEPT Take 1 tablet (10 mg total) by mouth at bedtime.   fluticasone 50 MCG/ACT nasal spray Commonly known as: FLONASE Place 2 sprays into both nostrils daily.   guaiFENesin 600 MG 12 hr tablet Commonly known as: MUCINEX Take 600 mg by mouth in the morning and at bedtime.   ipratropium-albuterol 0.5-2.5 (3) MG/3ML Soln Commonly known as: DUONEB Take 3 mLs by nebulization every 4 (four) hours as needed.   lactose free nutrition Liqd Take 120 mLs by mouth 3 (three) times daily. What changed: Another medication with the same name was removed. Continue taking this medication, and follow the directions you see here.   multivitamin Tabs tablet Take 1 tablet by mouth daily. What changed: when to take this   pantoprazole 40 MG tablet Commonly known as: PROTONIX Take 1 tablet (40 mg total) by mouth daily before breakfast.   predniSONE 20 MG tablet Commonly known as: DELTASONE Take 2 tablets (40 mg total) by mouth daily with breakfast for 7 days. What changed:  medication strength how much to take how to take this when to take this additional instructions   sertraline 50 MG tablet Commonly known as: ZOLOFT Take 50 mg by mouth daily.   Stiolto Respimat 2.5-2.5 MCG/ACT Aers Generic drug: Tiotropium Bromide-Olodaterol Inhale 1 puff into the lungs daily. What changed: when to take this               Discharge Care Instructions  (From admission, onward)           Start     Ordered   12/03/21 0000  Discharge wound care:        Comments: As per wound care RN recommendations:    Foam dressing  Every 3 days     Comments: Silicone foam dressings to the sacrum change every 3 days. Assess under dressings each shift for any acute changes in the wounds.   12/03/21 0934            Follow-up Information     Saguier, Kateri Mc. Schedule an appointment as soon as possible for a visit in 1 week(s).   Specialties: Internal Medicine, Family Medicine Contact information: [address] [place] [address] [phone]                Allergies  Allergen Reactions   Penicillins Swelling, Rash and Other (See Comments)    Reported to family member - occurred when he was in his 50s- "Allergic," per [facility]    Consultations: Palliative care   Procedures/Studies: CT Angio Chest PE W/Cm &/Or Wo Cm  Addendum Date: 11/29/2021   ADDENDUM REPORT: 11/29/2021 16:57 ADDENDUM: Not mentioned in the impressions are bilateral calcified pleural plaques, indicative of asbestos related pleural disease. Electronically Signed   By: Jeronimo Greaves  M.D.   On: 11/29/2021 16:57   Result Date: 11/29/2021 CLINICAL DATA:  Oxygen desaturation. Recently diagnosed with pneumonia. EXAM: CT ANGIOGRAPHY CHEST WITH CONTRAST TECHNIQUE: Multidetector CT imaging of the chest was performed using the standard protocol during bolus administration of intravenous contrast. Multiplanar CT image reconstructions and MIPs were obtained to evaluate the vascular anatomy. RADIATION DOSE REDUCTION: This exam was performed according to the departmental dose-optimization program which includes automated exposure control, adjustment of the mA and/or kV according to patient size and/or use of iterative reconstruction technique. CONTRAST:  17mL OMNIPAQUE IOHEXOL 300 MG/ML  SOLN COMPARISON:  Plain films of earlier today.  CT of 10/19/2018 FINDINGS: Cardiovascular: The quality of this exam for evaluation of pulmonary embolism is good. No evidence of  pulmonary embolism. Aortic atherosclerosis. Normal heart size, without pericardial effusion. Multivessel coronary artery atherosclerosis. Mediastinum/Nodes: No mediastinal or hilar adenopathy. Upper esophageal fluid level and dilatation including on 60/4. Lungs/Pleura: Bilateral calcified pleural plaques. No pleural fluid. Advanced bullous emphysema. Mild bronchiectasis in both lower lobes. Apparent posterior left upper lobe nodularity on 56/10 at 4 mm is new since 10/18/2021, favoring a focus of atelectasis or less likely infection. Increase in left and similar right lower lobe dependent airspace disease. Upper Abdomen: Mild motion degradation, primarily involving the lower chest and upper abdomen. Normal imaged portions of the liver, spleen, stomach, pancreas, adrenal glands, right kidney. Too small to characterize left renal lesions are most likely cysts . In the absence of clinically indicated signs/symptoms require(s) no independent follow-up. Musculoskeletal: No acute osseous abnormality. Review of the MIP images confirms the above findings. IMPRESSION: 1.  No evidence of pulmonary embolism. 2. Similar right and increased left lower lobe dependent airspace disease. Residual/recurrent aspiration versus infection. 3. Esophageal air fluid level suggests dysmotility or gastroesophageal reflux. Recommend clinical exclusion of aspiration. 4. Aortic atherosclerosis (ICD10-I70.0), coronary artery atherosclerosis and emphysema (ICD10-J43.9). Electronically Signed: By: Abigail Miyamoto M.D. On: 11/29/2021 14:06   DG Chest Port 1 View  Result Date: 11/29/2021 CLINICAL DATA:  Shortness of breath. EXAM: PORTABLE CHEST 1 VIEW COMPARISON:  Chest radiograph October 21, 2021 FINDINGS: Monitoring leads overlie the patient. Stable cardiac and mediastinal contours. Redemonstrated extensive calcified pleural plaques bilaterally. No definite evidence for acute superimposed process. IMPRESSION: No definite evidence for acute  superimposed process on a background of extensive calcified pleural plaques. Electronically Signed   By: Lovey Newcomer M.D.   On: 11/29/2021 12:18      Subjective: Patient seen and examined at bedside.  Awake but confused.  No overnight fever, vomiting, seizures or agitation reported.  Discharge Exam: Vitals:   12/03/21 0500 12/03/21 0808  BP: 110/71   Pulse: 67   Resp: 20   Temp: 97.9 F (36.6 C)   SpO2: 99% 99%    General: Pt is alert, awake, not in acute distress.  Currently on 2 L oxygen via nasal cannula Cardiovascular: rate controlled, S1/S2 + Respiratory: bilateral decreased breath sounds at bases with some scattered crackles Abdominal: Soft, NT, ND, bowel sounds + Extremities: Trace lower extremity edema;no cyanosis    The results of significant diagnostics from this hospitalization (including imaging, microbiology, ancillary and laboratory) are listed below for reference.     Microbiology: Recent Results (from the past 240 hour(s))  Blood Culture (routine x 2)     Status: None (Preliminary result)   Collection Time: 11/29/21 11:20 AM   Specimen: BLOOD  Result Value Ref Range Status   Specimen Description   Final    BLOOD  SITE NOT SPECIFIED Performed at [facility] [address] [place], [place] [address]    Special Requests   Final    BOTTLES DRAWN AEROBIC AND ANAEROBIC Blood Culture adequate volume Performed at [facility] [address], [place], [place] [address]    Culture   Final    NO GROWTH 4 DAYS Performed at [facility], [place] [address] [place], [place] [address]    Report Status PENDING  Incomplete  Urine Culture     Status: Abnormal   Collection Time: 11/29/21 11:28 AM   Specimen: In/Out Cath Urine  Result Value Ref Range Status   Specimen Description   Final    IN/OUT CATH URINE Performed at [facility] [address], [place], [place] [address]    Special Requests    Final    NONE Performed at [facility], [place] [address], [place], [place] [address]    Culture MULTIPLE SPECIES PRESENT, SUGGEST RECOLLECTION (A)  Final   Report Status 11/30/2021 FINAL  Final  Resp Panel by RT-PCR (Flu A&B, Covid)     Status: None   Collection Time: 11/29/21 11:28 AM   Specimen: Nasal Swab  Result Value Ref Range Status   SARS Coronavirus 2 by RT PCR NEGATIVE NEGATIVE Final    Comment: (NOTE) SARS-CoV-2 target nucleic acids are NOT DETECTED.  The SARS-CoV-2 RNA is generally detectable in upper respiratory specimens during the acute phase of infection. The lowest concentration of SARS-CoV-2 viral copies this assay can detect is 138 copies/mL. A negative result does not preclude SARS-Cov-2 infection and should not be used as the sole basis for treatment or other patient management decisions. A negative result may occur with  improper specimen collection/handling, submission of specimen other than nasopharyngeal swab, presence of viral mutation(s) within the areas targeted by this assay, and inadequate number of viral copies(<138 copies/mL). A negative result must be combined with clinical observations, patient history, and epidemiological information. The expected result is Negative.  Fact Sheet for Patients:  EntrepreneurPulse.com.au  Fact Sheet for Healthcare Providers:  IncredibleEmployment.be  This test is no t yet approved or cleared by the [place] FDA and  has been authorized for detection and/or diagnosis of SARS-CoV-2 by FDA under an Emergency Use Authorization (EUA). This EUA will remain  in effect (meaning this test can be used) for the duration of the COVID-19 declaration under Section 564(b)(1) of the Act, 21 U.S.C.section 360bbb-3(b)(1), unless the authorization is terminated  or revoked sooner.       Influenza A by PCR NEGATIVE NEGATIVE Final   Influenza B by PCR NEGATIVE NEGATIVE  Final    Comment: (NOTE) The Xpert Xpress SARS-CoV-2/FLU/RSV plus assay is intended as an aid in the diagnosis of influenza from Nasopharyngeal swab specimens and should not be used as a sole basis for treatment. Nasal washings and aspirates are unacceptable for Xpert Xpress SARS-CoV-2/FLU/RSV testing.  Fact Sheet for Patients: EntrepreneurPulse.com.au  Fact Sheet for Healthcare Providers: IncredibleEmployment.be  This test is not yet approved or cleared by the [place] FDA and has been authorized for detection and/or diagnosis of SARS-CoV-2 by FDA under an Emergency Use Authorization (EUA). This EUA will remain in effect (meaning this test can be used) for the duration of the COVID-19 declaration under Section 564(b)(1) of the Act, 21 U.S.C. section 360bbb-3(b)(1), unless the authorization is terminated or revoked.  Performed at [facility], [place] [address], [place], [place] [address]  Blood Culture (routine x 2)     Status: None (Preliminary result)   Collection Time: 11/29/21 11:33 AM   Specimen: BLOOD  Result Value Ref Range Status   Specimen Description   Final    BLOOD BLOOD LEFT FOREARM Performed at [facility] [address], [place], [place] [address]    Special Requests   Final    BOTTLES DRAWN AEROBIC AND ANAEROBIC Blood Culture adequate volume Performed at [facility] [address] [place], [place] [address]    Culture   Final    NO GROWTH 4 DAYS Performed at [facility], [place] [address], [place], [place] [address]    Report Status PENDING  Incomplete     Labs: BNP (last 3 results) Recent Labs    10/13/21 1211 11/29/21 1128  BNP 97.6 [id number]*   Basic Metabolic Panel: Recent Labs  Lab 11/29/21 1128 11/30/21 0040 12/01/21 0516 12/02/21 0510 12/03/21 0558  NA 143 145 146* 145 146*  K 4.0 3.8 3.3* 3.9 3.5  CL 103 107 108 109 106  CO2 28 29  33* 33* 35*  GLUCOSE 124* 139* 113* 112* 87  BUN 23 23 19 15 11   CREATININE 0.81 0.82 0.72 0.74 0.64  CALCIUM 9.0 8.6* 8.5* 8.4* 8.3*  MG  --   --   --  1.9 1.8   Liver Function Tests: Recent Labs  Lab 11/29/21 1128 11/30/21 0040  AST 25 20  ALT 16 13  ALKPHOS 90 76  BILITOT 0.7 0.6  PROT 8.3* 6.9  ALBUMIN 3.0* 2.5*   No results for input(s): "LIPASE", "AMYLASE" in the last 168 hours. No results for input(s): "AMMONIA" in the last 168 hours. CBC: Recent Labs  Lab 11/29/21 1128 11/30/21 0040 12/01/21 0516 12/02/21 0510 12/03/21 0558  WBC 11.5* 9.2 14.3* 11.2* 8.9  NEUTROABS 9.1*  --   --  9.6* 6.7  HGB 13.5 10.7* 10.1* 9.4* 9.9*  HCT 42.8 33.6* 32.5* 30.6* 32.2*  MCV 99.3 99.4 101.9* 101.7* 100.3*  PLT 322 253 257 238 252   Cardiac Enzymes: No results for input(s): "CKTOTAL", "CKMB", "CKMBINDEX", "TROPONINI" in the last 168 hours. BNP: Invalid input(s): "POCBNP" CBG: No results for input(s): "GLUCAP" in the last 168 hours. D-Dimer No results for input(s): "DDIMER" in the last 72 hours. Hgb A1c No results for input(s): "HGBA1C" in the last 72 hours. Lipid Profile No results for input(s): "CHOL", "HDL", "LDLCALC", "TRIG", "CHOLHDL", "LDLDIRECT" in the last 72 hours. Thyroid function studies No results for input(s): "TSH", "T4TOTAL", "T3FREE", "THYROIDAB" in the last 72 hours.  Invalid input(s): "FREET3" Anemia work up No results for input(s): "VITAMINB12", "FOLATE", "FERRITIN", "TIBC", "IRON", "RETICCTPCT" in the last 72 hours. Urinalysis    Component Value Date/Time   COLORURINE YELLOW 11/29/2021 1128   APPEARANCEUR CLEAR 11/29/2021 1128   LABSPEC >1.046 (H) 11/29/2021 1128   PHURINE 5.0 11/29/2021 1128   GLUCOSEU NEGATIVE 11/29/2021 1128   HGBUR MODERATE (A) 11/29/2021 1128   BILIRUBINUR NEGATIVE 11/29/2021 1128   KETONESUR 20 (A) 11/29/2021 1128   PROTEINUR NEGATIVE 11/29/2021 1128   NITRITE NEGATIVE 11/29/2021 1128   LEUKOCYTESUR NEGATIVE 11/29/2021  1128   Sepsis Labs Recent Labs  Lab 11/30/21 0040 12/01/21 0516 12/02/21 0510 12/03/21 0558  WBC 9.2 14.3* 11.2* 8.9   Microbiology Recent Results (from the past 240 hour(s))  Blood Culture (routine x 2)     Status: None (Preliminary result)   Collection Time: 11/29/21 11:20 AM   Specimen:  BLOOD  Result Value Ref Range Status   Specimen Description   Final    BLOOD SITE NOT SPECIFIED Performed at [facility], [address], [place], [place] [address]    Special Requests   Final    BOTTLES DRAWN AEROBIC AND ANAEROBIC Blood Culture adequate volume Performed at [facility], [address], [place], [place] [address]    Culture   Final    NO GROWTH 4 DAYS Performed at [facility], [address], [place], [place] [address]    Report Status PENDING  Incomplete  Urine Culture     Status: Abnormal   Collection Time: 11/29/21 11:28 AM   Specimen: In/Out Cath Urine  Result Value Ref Range Status   Specimen Description   Final    IN/OUT CATH URINE Performed at [facility], [address] [place], [place] [address]    Special Requests   Final    NONE Performed at [facility], [address], [place], [place] [address]    Culture MULTIPLE SPECIES PRESENT, SUGGEST RECOLLECTION (A)  Final   Report Status 11/30/2021 FINAL  Final  Resp Panel by RT-PCR (Flu A&B, Covid)     Status: None   Collection Time: 11/29/21 11:28 AM   Specimen: Nasal Swab  Result Value Ref Range Status   SARS Coronavirus 2 by RT PCR NEGATIVE NEGATIVE Final    Comment: (NOTE) SARS-CoV-2 target nucleic acids are NOT DETECTED.  The SARS-CoV-2 RNA is generally detectable in upper respiratory specimens during the acute phase of infection. The lowest concentration of SARS-CoV-2 viral copies this assay can detect is 138 copies/mL. A negative result does not preclude SARS-Cov-2 infection and should not be used as the sole  basis for treatment or other patient management decisions. A negative result may occur with  improper specimen collection/handling, submission of specimen other than nasopharyngeal swab, presence of viral mutation(s) within the areas targeted by this assay, and inadequate number of viral copies(<138 copies/mL). A negative result must be combined with clinical observations, patient history, and epidemiological information. The expected result is Negative.  Fact Sheet for Patients:  BloggerCourse.com  Fact Sheet for Healthcare Providers:  SeriousBroker.it  This test is no t yet approved or cleared by the [place] FDA and  has been authorized for detection and/or diagnosis of SARS-CoV-2 by FDA under an Emergency Use Authorization (EUA). This EUA will remain  in effect (meaning this test can be used) for the duration of the COVID-19 declaration under Section 564(b)(1) of the Act, 21 U.S.C.section 360bbb-3(b)(1), unless the authorization is terminated  or revoked sooner.       Influenza A by PCR NEGATIVE NEGATIVE Final   Influenza B by PCR NEGATIVE NEGATIVE Final    Comment: (NOTE) The Xpert Xpress SARS-CoV-2/FLU/RSV plus assay is intended as an aid in the diagnosis of influenza from Nasopharyngeal swab specimens and should not be used as a sole basis for treatment. Nasal washings and aspirates are unacceptable for Xpert Xpress SARS-CoV-2/FLU/RSV testing.  Fact Sheet for Patients: BloggerCourse.com  Fact Sheet for Healthcare Providers: SeriousBroker.it  This test is not yet approved or cleared by the [place] FDA and has been authorized for detection and/or diagnosis of SARS-CoV-2 by FDA under an Emergency Use Authorization (EUA). This EUA will remain in effect (meaning this test can be used) for the duration of the COVID-19 declaration under Section 564(b)(1) of the Act,  21 U.S.C. section 360bbb-3(b)(1), unless the authorization  is terminated or revoked.  Performed at [facility], [place] [address] [place], [place] [address]   Blood Culture (routine x 2)     Status: None (Preliminary result)   Collection Time: 11/29/21 11:33 AM   Specimen: BLOOD  Result Value Ref Range Status   Specimen Description   Final    BLOOD BLOOD LEFT FOREARM Performed at [facility] [address] [place], [place] [address]    Special Requests   Final    BOTTLES DRAWN AEROBIC AND ANAEROBIC Blood Culture adequate volume Performed at [facility] [address], [place], [place] [address]    Culture   Final    NO GROWTH 4 DAYS Performed at [facility], [place] [address], [place], [place] [address]    Report Status PENDING  Incomplete     Time coordinating discharge: 35 minutes  SIGNED:   Aline August, MD  [facility] 12/03/2021, 9:34 AM

## 2021-12-03 NOTE — TOC Transition Note (Signed)
Transition of Care [facility]) - CM/SW Discharge Note  Patient Details  Name: Jonathan Harmon MRN: [id number] Date of Birth: 06-10-1933  Transition of Care [facility]) CM/SW Contact:  Ewing Schlein, LCSW Phone Number: 12/03/2021, 2:31 PM  Clinical Narrative: Patient is medically ready to discharge back to [facility]. CSW spoke with patient's son, Jonathan Harmon, regarding hospice referral. Per son, he spoke with palliative yesterday and they are in agreement that patient would benefit from hospice at the ALF and it was requested that it be set up with [facility]. CSW called [facility] and spoke with Danford Bad to make hospice referral.  CSW spoke with Angelique Blonder at [facility] to confirm patient can return today. Angelique Blonder requested the discharge summary and FL2 be faxed to [phone] and [phone]. FL2 completed. CSW faxed discharge summary and FL2 to facility for review.  Medical necessity form done; PTAR scheduled. Discharge packet completed. RN updated. TOC signing off.  Final next level of care: Assisted Living Barriers to Discharge: Barriers Resolved  Patient Goals and CMS Choice Patient states their goals for this hospitalization and ongoing recovery are:: To return to ALF CMS Medicare.gov Compare Post Acute Care list provided to:: Patient Represenative (must comment) Choice offered to / list presented to : Adult Children  Discharge Placement       Patient chooses bed at: Other - please specify in the comment section below: Patient to be transferred to facility by: PTAR Name of family member notified: Jonathan Harmon (son) Patient and family notified of of transfer: 12/03/21  Discharge Plan and Services In-house Referral: Hospice / Palliative Care, Clinical Social Work Discharge Planning Services: CM Consult Post Acute Care Choice: Home Health          DME Arranged: N/A DME Agency: NA  Readmission Risk Interventions    12/01/2021   10:07 AM  Readmission Risk Prevention Plan   Transportation Screening Complete  PCP or Specialist Appt within 3-5 Days Complete  HRI or Home Care Consult Complete  Social Work Consult for Recovery Care Planning/Counseling Complete  Medication Review [employer]) Complete

## 2021-12-03 NOTE — Consult Note (Signed)
   [facility] CM Inpatient Consult   12/03/2021  Jonathan Harmon 09/01/1933 [id number]  [facility] [[facility]]  Accountable Care Organization [ACO] Patient: Medicare ACO REACH  *Remote coverage review for high risk score for unplanned readmission risk, patient at [facility]  Primary Care Provider:  Flo Shanks at [facility] is an Embedded provider with a Chronic Care Management/Care Coordination team and program  Patient screened for readmission prevention hospitalization with noted high risk score for unplanned readmission risk and to assess record for potential service needs in the Embedded .  Review of patient's medical record reveals patient is currently recommended to return to ALF with [facility]  verse with palliative/hospice suggested.   Plan: PCP is listed to provide the [facility] follow up and appointments.  Continue to follow progress and disposition to assess for post hospital care management needs.  No [facility] care coordination for transition noted other than TOC follow up.  For questions contact:   Charlesetta Shanks, RN BSN CCM [facility]  [phone] business mobile phone Toll free office [phone]  Fax number: [phone] [email or web address]

## 2021-12-03 NOTE — Progress Notes (Signed)
Civil engineer, contracting [facility]) Hospital Liaison Note  Referral received for patient/family interest in hospice at [facility]. Chart under review by [facility] physician.   Hospice eligibility pending.   Plan is to discharge back to [facility] today.   DME in the home: oxygen  DME needs: none  Please send patient with comfort medications/prescriptions at discharge.   Please let us know if you have any questions or concerns. Thank you

## 2021-12-03 NOTE — NC FL2 (Signed)
MEDICAID [id number] LEVEL OF CARE SCREENING TOOL     IDENTIFICATION  Patient Name: Jonathan Harmon Birthdate: January 06, 1934 Sex: male Admission Date (Current Location): 11/29/2021  [facility] and [place] Number:  Producer, television/film/video and Address:  [facility],  [address] [place], [place] [address]      Provider Number: [id number]  Attending Physician Name and Address:  Glade Lloyd, MD  Relative Name and Phone Number:  Mclean Moya (son) Ph: [phone]    Current Level of Care: Hospital Recommended Level of Care: Assisted Living Facility Prior Approval Number:    Date Approved/Denied:   PASRR Number:    Discharge Plan: Other (Comment) ([facility])    Current Diagnoses: Patient Active Problem List   Diagnosis Date Noted   Hypokalemia 12/01/2021   Leukocytosis 12/01/2021   Macrocytic anemia 12/01/2021   Physical deconditioning 12/01/2021   Pressure injury of skin 11/30/2021   Aspiration pneumonia (HCC) 11/29/2021   Acute on chronic respiratory failure with hypoxia (HCC) 11/29/2021   Hypernatremia 10/20/2021   Acute metabolic encephalopathy 10/20/2021   H/O laceration repair 6/21 on face 10/20/2021   UIP (usual interstitial pneumonitis) (HCC) 10/20/2021   Protein-calorie malnutrition, severe 10/19/2021   Community acquired pneumonia, bilateral with mucus plugging 10/18/2021   Asbestosis (HCC) 08/11/2020   Chronic respiratory failure with hypoxia (HCC) 08/11/2020   HTN (hypertension) 08/11/2020   Arthralgia of both hands 10/29/2019   Allergic rhinitis 08/31/2018   Dementia (HCC) 01/07/2018   Exertional dyspnea 07/28/2016   COPD (chronic obstructive pulmonary disease) (HCC) 07/28/2016   Calcified pleural plaque due to asbestos exposure 07/28/2016    Orientation RESPIRATION BLADDER Height & Weight     Self  O2 (2-2.5L/min) Incontinent Weight: 91 lb 0.8 oz (41.3 kg) Height:     BEHAVIORAL SYMPTOMS/MOOD NEUROLOGICAL BOWEL NUTRITION STATUS    (N/A)  (N/A) Incontinent Diet (Dysphagia 3 diet)  AMBULATORY STATUS COMMUNICATION OF NEEDS Skin   Limited Assist Verbally Normal                       Personal Care Assistance Level of Assistance  Bathing, Feeding, Dressing Bathing Assistance: Limited assistance Feeding assistance: Limited assistance Dressing Assistance: Limited assistance     Functional Limitations Info  Sight, Hearing, Speech Sight Info: Adequate Hearing Info: Adequate Speech Info: Adequate    SPECIAL CARE FACTORS FREQUENCY                       Contractures Contractures Info: Not present    Additional Factors Info  Code Status, Allergies, Psychotropic Code Status Info: DNR Allergies Info: Penicillins Psychotropic Info: Zoloft         Current Medications (12/03/2021):  This is the current hospital active medication list Current Facility-Administered Medications  Medication Dose Route Frequency Provider Last Rate Last Admin   acetaminophen (TYLENOL) tablet 650 mg  650 mg Oral Q6H PRN Ronaldo Miyamoto, Tyrone A, DO       Or   acetaminophen (TYLENOL) suppository 650 mg  650 mg Rectal Q6H PRN Ronaldo Miyamoto, Tyrone A, DO       atorvastatin (LIPITOR) tablet 40 mg  40 mg Oral QHS Kyle, Tyrone A, DO   40 mg at 12/02/21 2148   azithromycin (ZITHROMAX) tablet 500 mg  500 mg Oral Daily Alekh, Kshitiz, MD   500 mg at 12/03/21 1031   cefTRIAXone (ROCEPHIN) 2 g in sodium chloride 0.9 % 100 mL IVPB  2 g Intravenous Q24H Ronaldo Miyamoto,  Tyrone A, DO 200 mL/hr at 12/03/21 1029 2 g at 12/03/21 1029   divalproex (DEPAKOTE SPRINKLE) capsule 125 mg  125 mg Oral BID Ronaldo Miyamoto, Tyrone A, DO   125 mg at 12/03/21 1031   donepezil (ARICEPT) tablet 10 mg  10 mg Oral QHS Kyle, Tyrone A, DO   10 mg at 12/02/21 2148   enoxaparin (LOVENOX) injection 40 mg  40 mg Subcutaneous Q24H Kyle, Tyrone A, DO   40 mg at 12/03/21 1039   guaiFENesin (MUCINEX) 12 hr tablet 600 mg  600 mg Oral BID Ronaldo Miyamoto, Tyrone A, DO   600 mg at 12/03/21 1031   ipratropium-albuterol  (DUONEB) 0.5-2.5 (3) MG/3ML nebulizer solution 3 mL  3 mL Nebulization BID Leatha Gilding, MD   3 mL at 12/03/21 0808   methylPREDNISolone sodium succinate (SOLU-MEDROL) 40 mg/mL injection 40 mg  40 mg Intravenous Daily Glade Lloyd, MD   40 mg at 12/03/21 1031   mometasone-formoterol (DULERA) 200-5 MCG/ACT inhaler 2 puff  2 puff Inhalation BID Glade Lloyd, MD   2 puff at 12/03/21 0808   multivitamin with minerals tablet 1 tablet  1 tablet Oral Q breakfast Ronaldo Miyamoto, Tyrone A, DO   1 tablet at 12/03/21 0815   ondansetron (ZOFRAN) tablet 4 mg  4 mg Oral Q6H PRN Ronaldo Miyamoto, Tyrone A, DO       Or   ondansetron (ZOFRAN) injection 4 mg  4 mg Intravenous Q6H PRN Ronaldo Miyamoto, Tyrone A, DO       pantoprazole (PROTONIX) EC tablet 40 mg  40 mg Oral QAC breakfast Ronaldo Miyamoto, Tyrone A, DO   40 mg at 12/03/21 0815   sertraline (ZOLOFT) tablet 50 mg  50 mg Oral Daily Kyle, Tyrone A, DO   50 mg at 12/03/21 1031     Discharge Medications: Please see discharge summary for a list of discharge medications.  Relevant Imaging Results:  Relevant Lab Results:   Additional Information SSN: [id number]  Ewing Schlein, LCSW

## 2021-12-03 NOTE — Care Management Important Message (Signed)
Important Message  Patient Details IM Letter placed in Patients room. Name: Lonney Revak MRN: [id number] Date of Birth: 06-20-1933   Medicare Important Message Given:  Yes     Caren Macadam 12/03/2021, 12:00 PM

## 2021-12-03 NOTE — Progress Notes (Signed)
Pt discharging to ALF. ALF called and notified admitting the patient was on the way. This RN requested to give report to a nurse, and secretary stated that she will notify admitting. PIV removed. VSS. Pt has all belongings. AVS, discharge summary, and printed Rx printed and placed in pt envelope for facility use. No further questions at this time.

## 2021-12-04 LAB — CULTURE, BLOOD (ROUTINE X 2)
Culture: NO GROWTH
Culture: NO GROWTH
Special Requests: ADEQUATE
Special Requests: ADEQUATE

## 2021-12-21 ENCOUNTER — Telehealth: Payer: Self-pay | Admitting: Emergency Medicine

## 2021-12-21 NOTE — Telephone Encounter (Signed)
I think based on his current clinical status we should cancel the office visit. Thank you for letting me know

## 2021-12-21 NOTE — Telephone Encounter (Signed)
Pt has declined rather rapidly over the last month and a half. He is in hopsice and they are unsure if it is wise for him to travel. They have has discontinued use of inhalers as pt cannot get deep enough breath, and they are estimating about 1-2 months for his expectency. Is HFU necessary for 8/31 given this information? Number listed is direct cell for [facility]. This is backup number for hospice. [phone]. Please advise.

## 2021-12-21 NOTE — Telephone Encounter (Signed)
Pt has declined rather rapidly over the last month and a half. He is in hopsice and they are unsure if it is wise for him to travel. They have has discontinued use of inhalers as pt cannot get deep enough breath, and they are estimating about 1-2 months for his expectency. Is HFU necessary for 8/31 given this information?    Dr. Delton Coombes please advise. Marked HP since appt is in 2 days.

## 2021-12-22 NOTE — Telephone Encounter (Signed)
Called and spoke with [facility].  Dr. Kavin Harmon recommendations given.  Jonathan Harmon stated would let family know hospital follow up has been cancelled. Nothing further at this time.

## 2021-12-23 ENCOUNTER — Inpatient Hospital Stay: Payer: Medicare Other | Admitting: Emergency Medicine

## 2022-01-23 DEATH — deceased
